# Patient Record
Sex: Female | Born: 1940 | Race: White | Hispanic: No | Smoking: Former smoker
Health system: Southern US, Community
[De-identification: ages and names within clinical notes are randomized; demographics above are authoritative.]

## PROBLEM LIST (undated history)

## (undated) DIAGNOSIS — K220 Achalasia of cardia: Secondary | ICD-10-CM

## (undated) DIAGNOSIS — G243 Spasmodic torticollis: Secondary | ICD-10-CM

## (undated) DIAGNOSIS — T7840XA Allergy, unspecified, initial encounter: Secondary | ICD-10-CM

## (undated) DIAGNOSIS — M1990 Unspecified osteoarthritis, unspecified site: Secondary | ICD-10-CM

## (undated) DIAGNOSIS — F419 Anxiety disorder, unspecified: Secondary | ICD-10-CM

## (undated) DIAGNOSIS — H9190 Unspecified hearing loss, unspecified ear: Secondary | ICD-10-CM

## (undated) DIAGNOSIS — M4800 Spinal stenosis, site unspecified: Secondary | ICD-10-CM

## (undated) DIAGNOSIS — H8109 Meniere's disease, unspecified ear: Secondary | ICD-10-CM

## (undated) DIAGNOSIS — F909 Attention-deficit hyperactivity disorder, unspecified type: Secondary | ICD-10-CM

## (undated) DIAGNOSIS — B019 Varicella without complication: Secondary | ICD-10-CM

## (undated) DIAGNOSIS — G709 Myoneural disorder, unspecified: Secondary | ICD-10-CM

## (undated) DIAGNOSIS — K222 Esophageal obstruction: Secondary | ICD-10-CM

## (undated) DIAGNOSIS — I10 Essential (primary) hypertension: Secondary | ICD-10-CM

## (undated) HISTORY — DX: Unspecified hearing loss, unspecified ear: H91.90

## (undated) HISTORY — DX: Meniere's disease, unspecified ear: H81.09

## (undated) HISTORY — DX: Spinal stenosis, site unspecified: M48.00

## (undated) HISTORY — DX: Spasmodic torticollis: G24.3

## (undated) HISTORY — DX: Attention-deficit hyperactivity disorder, unspecified type: F90.9

## (undated) HISTORY — PX: EYE SURGERY: SHX253

## (undated) HISTORY — DX: Allergy, unspecified, initial encounter: T78.40XA

## (undated) HISTORY — PX: CYST EXCISION: SHX5701

## (undated) HISTORY — DX: Varicella without complication: B01.9

## (undated) HISTORY — DX: Esophageal obstruction: K22.2

## (undated) HISTORY — DX: Unspecified osteoarthritis, unspecified site: M19.90

## (undated) HISTORY — DX: Achalasia of cardia: K22.0

---

## 2005-02-04 HISTORY — PX: NISSEN FUNDOPLICATION: SHX2091

## 2005-08-04 HISTORY — PX: HELLER MYOTOMY: SHX5259

## 2012-06-04 HISTORY — PX: TOTAL HIP ARTHROPLASTY: SHX124

## 2020-04-21 ENCOUNTER — Ambulatory Visit (INDEPENDENT_AMBULATORY_CARE_PROVIDER_SITE_OTHER): Payer: Medicare Other | Admitting: Family Medicine

## 2020-04-21 ENCOUNTER — Ambulatory Visit (INDEPENDENT_AMBULATORY_CARE_PROVIDER_SITE_OTHER): Payer: Medicare Other

## 2020-04-21 ENCOUNTER — Encounter: Payer: Self-pay | Admitting: Family Medicine

## 2020-04-21 ENCOUNTER — Other Ambulatory Visit: Payer: Self-pay

## 2020-04-21 VITALS — BP 120/76 | HR 94 | Ht 61.0 in | Wt 117.0 lb

## 2020-04-21 DIAGNOSIS — M25551 Pain in right hip: Secondary | ICD-10-CM

## 2020-04-21 DIAGNOSIS — M25561 Pain in right knee: Secondary | ICD-10-CM

## 2020-04-21 DIAGNOSIS — G8929 Other chronic pain: Secondary | ICD-10-CM | POA: Diagnosis not present

## 2020-04-21 DIAGNOSIS — G243 Spasmodic torticollis: Secondary | ICD-10-CM | POA: Insufficient documentation

## 2020-04-21 DIAGNOSIS — M1711 Unilateral primary osteoarthritis, right knee: Secondary | ICD-10-CM

## 2020-04-21 NOTE — Progress Notes (Addendum)
[name] [address] [place] [address] Phone: [phone] Subjective:   Melissa Cervantes, am serving as a scribe for Dr. Antoine Primas. This visit occurred during the SARS-CoV-2 public health emergency.  Safety protocols were in place, including screening questions prior to the visit, additional usage of staff PPE, and extensive cleaning of exam room while observing appropriate contact time as indicated for disinfecting solutions.   I'm seeing this patient by the request  of:  Patient, No Pcp Per  CC: Right knee and hip pain  GLO:[id number]  Melissa Cervantes is a 80 y.o. female coming in with complaint of R hip and R knee. Pain in groin for past year after hiking and from sit to stand. Patient feels weakness. Patient would like to walk 3 miles a day on her good days. Taking IBU prn, topical analegisc and did epsom salt soak.   Chronic pain in R knee with increasing pain over past 10 days. Pain over lateral aspect that is tingling. Pain over patellar tendon with palpitation.   History of torn meniscus in R knee 5 years. No surgical intervention but did do physical therapy.  Patient states that this feels a little bit similar but more instability noted.  History of L THR in 2014.  Right hip could be secondary to note to have she is walking she feels.          Social History   Socioeconomic History  . Marital status: Single    Spouse name: Not on file  . Number of children: Not on file  . Years of education: Not on file  . Highest education level: Not on file  Occupational History  . Not on file  Tobacco Use  . Smoking status: Not on file  . Smokeless tobacco: Not on file  Substance and Sexual Activity  . Alcohol use: Not on file  . Drug use: Not on file  . Sexual activity: Not on file  Other Topics Concern  . Not on file  Social History Narrative  . Not on file   Social Determinants of Health   Financial Resource Strain: Not on  file  Food Insecurity: Not on file  Transportation Needs: Not on file  Physical Activity: Not on file  Stress: Not on file  Social Connections: Not on file   Not on File No family history on file.    Current Outpatient Medications (Respiratory):  .  fluticasone (FLONASE) 50 MCG/ACT nasal spray, Place 2 sprays into both nostrils 2 (two) times daily. .  montelukast (SINGULAIR) 10 MG tablet, montelukast 10 mg tablet  Take 1 tablet as needed by oral route.  Current Outpatient Medications (Analgesics):  .  ibuprofen (ADVIL) 400 MG tablet, ibuprofen 400 mg tablet  Take 1 tablet 3 times a day by oral route as needed for 30 days.   Current Outpatient Medications (Other):  .  clonazePAM (KLONOPIN) 0.5 MG tablet, clonazepam 0.5 mg tablet  Take 1 tablet twice a day by oral route as needed for 30 days.   Reviewed prior external information including notes and imaging from  primary care provider As well as notes that were available from care everywhere and other healthcare systems.  Past medical history, social, surgical and family history all reviewed in electronic medical record.  No pertanent information unless stated regarding to the chief complaint.   Review of Systems:  No headache, visual changes, nausea, vomiting, diarrhea, constipation, dizziness, abdominal pain, skin rash, fevers, chills,  night sweats, weight loss, swollen lymph nodes, body aches, joint swelling, chest pain, shortness of breath, mood changes. POSITIVE muscle aches  Objective  Blood pressure 120/76, pulse 94, height 5\' 1"  (1.549 m), weight 117 lb (53.1 kg), SpO2 95 %.   General: No apparent distress alert the patient does seem to have some mild confusion.  Questionable mild dementia HEENT: Pupils equal, extraocular movements intact  Respiratory: Patient's speak in full sentences and does not appear short of breath  Cardiovascular: No lower extremity edema, non tender, no erythema  Gait normal with good balance and  coordination.  MSK: Right knee does have some instability with valgus and varus force.  Patient does have some tenderness mostly over the medial joint line and minorly over the popliteal area.  No swelling or masses appreciated.  Crepitus noted with range of motion.  ACL though does appear to be intact.  Mild lateral tracking of the patella noted.    Impression and Recommendations:     The above documentation has been reviewed and is accurate and complete , DO

## 2020-04-21 NOTE — Patient Instructions (Signed)
Pennsaid 2x a day and then Voltren gel  Vit D 2000IU daily Tart Cherry Extract 1200mg  at night Xray today See me again in 4 weeks

## 2020-04-21 NOTE — Assessment & Plan Note (Addendum)
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  Do believe that this is what is contributing to more of the pain.  Patient does have trace effusion noted.  Mild instability though with valgus and varus force.  Discussed different treatment options and patient declined injection.  We also discussed the possibility of a Doppler patient having some mild popliteal area.  Patient does not have swelling of the lower extremity and no redness.  Patient declined that today.  Patient would like to try conservative approach and go with more of the home exercises, hinged brace, topical anti-inflammatories and see how she responds.  Patient will follow up with me again in 4 to 6 weeks and with worsening pain consider injection as well as the possibility of physical therapy.  Patient knows if worsening pain over the weekend to seek medical attention immediately.

## 2020-05-02 ENCOUNTER — Ambulatory Visit: Payer: Self-pay | Admitting: Family Medicine

## 2020-05-23 NOTE — Progress Notes (Signed)
[facility] [address] [place] [address] Phone: [phone] Subjective:   Melissa Cervantes, am serving as a scribe for Dr. Antoine Primas. This visit occurred during the SARS-CoV-2 public health emergency.  Safety protocols were in place, including screening questions prior to the visit, additional usage of staff PPE, and extensive cleaning of exam room while observing appropriate contact time as indicated for disinfecting solutions.   I'm seeing this patient by the request  of:  Patient, No Pcp Per (Inactive)  CC: Right knee pain  PIR:[id number]   04/21/2020 Patient does have degenerative changes of the right knee.  Do believe that this is what is contributing to more of the pain.  Patient does have trace effusion noted.  Mild instability though with valgus and varus force.  Discussed different treatment options and patient declined injection.  We also discussed the possibility of a Doppler patient having some mild popliteal area.  Patient does not have swelling of the lower extremity and no redness.  Patient declined that today.  Patient would like to try conservative approach and go with more of the home exercises, hinged brace, topical anti-inflammatories and see how she responds.  Patient will follow up with me again in 4 to 6 weeks and with worsening pain consider injection as well as the possibility of physical therapy.  Patient knows if worsening pain over the weekend to seek medical attention immediately.  Update 05/24/2020 Melissa Cervantes is a 80 y.o. female coming in with complaint of right knee pain. Patient states that she is doing better. Knee pain dissipated a few days after last visit. Using Voltaren gel.   Patient notes R hip pain that occurs with exercise. Sometimes feels as if hip is going to "give out".  Pain over lateral aspect to the ASIS. Patient notes going up stairs elicits symptoms.    Xray 04/21/2020 R  Knee IMPRESSION: Osteoarthritic change, most notably in the patellofemoral joint. 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 No fracture, dislocation, or joint Effusion.  Xray 04/21/2020 Hip IMPRESSION: Status post total hip replacement on the left. No fracture or dislocation. Narrowing right hip joint. Probable calcific tendinosis lateral to the superolateral aspect of the right acetabulum. Degenerative change in lower lumbar spine and sacroiliac joints. No past medical history on file. No past surgical history on file. Social History   Socioeconomic History  . Marital status: Single    Spouse name: Not on file  . Number of children: Not on file  . Years of education: Not on file  . Highest education level: Not on file  Occupational History  . Not on file  Tobacco Use  . Smoking status: Not on file  . Smokeless tobacco: Not on file  Substance and Sexual Activity  . Alcohol use: Not on file  . Drug use: Not on file  . Sexual activity: Not on file  Other Topics Concern  . Not on file  Social History Narrative  . Not on file   Social Determinants of Health   Financial Resource Strain: Not on file  Food Insecurity: Not on file  Transportation Needs: Not on file  Physical Activity: Not on file  Stress: Not on file  Social Connections: Not on file   Not on File no family history of autoimmune No family history on file.    Current Outpatient Medications (Respiratory):  .  fluticasone (FLONASE) 50 MCG/ACT nasal spray, Place 2 sprays into both nostrils  2 (two) times daily. .  montelukast (SINGULAIR) 10 MG tablet, montelukast 10 mg tablet  Take 1 tablet as needed by oral route.  Current Outpatient Medications (Analgesics):  .  ibuprofen (ADVIL) 400 MG tablet, ibuprofen 400 mg tablet  Take 1 tablet 3 times a day by oral route as needed for 30 days.   Current Outpatient Medications (Other):  .  clonazePAM (KLONOPIN)  0.5 MG tablet, clonazepam 0.5 mg tablet  Take 1 tablet twice a day by oral route as needed for 30 days.   Reviewed prior external information including notes and imaging from  primary care provider As well as notes that were available from care everywhere and other healthcare systems.  Past medical history, social, surgical and family history all reviewed in electronic medical record.  No pertanent information unless stated regarding to the chief complaint.   Review of Systems:  No headache, visual changes, nausea, vomiting, diarrhea, constipation, dizziness, abdominal pain, skin rash, fevers, chills, night sweats, weight loss, swollen lymph nodes, body aches, joint swelling, chest pain, shortness of breath, mood changes. POSITIVE muscle aches  Objective  Blood pressure (!) 130/94, height 5\' 1"  (1.549 m), weight 122 lb (55.3 kg).   General: No apparent distress alert and oriented x3 mood and affect normal, dressed appropriately.  Patient is somewhat hard of hearing. HEENT: Pupils equal, extraocular movements intact  Respiratory: Patient's speak in full sentences and does not appear short of breath  Cardiovascular: No lower extremity edema, non tender, no erythema  Gait normal with good balance and coordination.  MSK: Patient's right knee does have some instability noted with valgus and varus force.  Trace effusion noted.  Mild limited lacking of the last 5 degrees of extension.  Very minimal tenderness to palpation. Right hip exam does have some mild limited internal range of motion noted.   Total time with patient discussing diet changes, physical exam and going over x-rays 35 minutes today. Impression and Recommendations:     The above documentation has been reviewed and is accurate and complete , DO

## 2020-05-24 ENCOUNTER — Ambulatory Visit (INDEPENDENT_AMBULATORY_CARE_PROVIDER_SITE_OTHER): Payer: Medicare Other | Admitting: Family Medicine

## 2020-05-24 ENCOUNTER — Other Ambulatory Visit: Payer: Self-pay

## 2020-05-24 ENCOUNTER — Encounter: Payer: Self-pay | Admitting: Family Medicine

## 2020-05-24 DIAGNOSIS — M1711 Unilateral primary osteoarthritis, right knee: Secondary | ICD-10-CM | POA: Diagnosis not present

## 2020-05-24 DIAGNOSIS — M1611 Unilateral primary osteoarthritis, right hip: Secondary | ICD-10-CM

## 2020-05-24 DIAGNOSIS — M1120 Other chondrocalcinosis, unspecified site: Secondary | ICD-10-CM | POA: Diagnosis not present

## 2020-05-24 NOTE — Assessment & Plan Note (Signed)
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  Discussed with patient about the instability.  Patient declined any type of injection again at this point.  Patient is doing relatively well and continuing to stay active.  Patient will start with aquatic therapy that I think will be helpful.  Patient will follow up with me again 2 to 3 months.

## 2020-05-24 NOTE — Patient Instructions (Signed)
Tart cherry extract 1200mg  at night Look on , Natures Made, NOW Hip is moderate arthritis-staying active great Start aquatic classes See me again in 2-3 months

## 2020-05-24 NOTE — Assessment & Plan Note (Signed)
Arthritis of the right hip.  Discussed icing regimen and home exercises.  Discussed which activities to do which wants to avoid.  Patient has moderate arthritis and has had a contralateral replacement previously.  We will continue to monitor and patient will follow up again in 2 to 3 months.

## 2020-05-24 NOTE — Assessment & Plan Note (Signed)
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  We discussed tart cherry extract, who may need possible allopurinol, patient encouraged him to continue a dairy diet.  Patient will follow up again in 2 to 3 months

## 2020-06-07 ENCOUNTER — Other Ambulatory Visit: Payer: Self-pay

## 2020-06-08 ENCOUNTER — Ambulatory Visit (INDEPENDENT_AMBULATORY_CARE_PROVIDER_SITE_OTHER): Payer: Medicare Other | Admitting: Adult Health

## 2020-06-08 ENCOUNTER — Encounter: Payer: Self-pay | Admitting: Adult Health

## 2020-06-08 VITALS — BP 118/62 | HR 73 | Temp 98.4°F | Ht 60.75 in | Wt 116.2 lb

## 2020-06-08 DIAGNOSIS — F909 Attention-deficit hyperactivity disorder, unspecified type: Secondary | ICD-10-CM

## 2020-06-08 DIAGNOSIS — Z1231 Encounter for screening mammogram for malignant neoplasm of breast: Secondary | ICD-10-CM

## 2020-06-08 DIAGNOSIS — J4520 Mild intermittent asthma, uncomplicated: Secondary | ICD-10-CM

## 2020-06-08 DIAGNOSIS — M159 Polyosteoarthritis, unspecified: Secondary | ICD-10-CM

## 2020-06-08 DIAGNOSIS — H8102 Meniere's disease, left ear: Secondary | ICD-10-CM | POA: Diagnosis not present

## 2020-06-08 DIAGNOSIS — Z7689 Persons encountering health services in other specified circumstances: Secondary | ICD-10-CM | POA: Diagnosis not present

## 2020-06-08 DIAGNOSIS — G243 Spasmodic torticollis: Secondary | ICD-10-CM

## 2020-06-08 DIAGNOSIS — M8949 Other hypertrophic osteoarthropathy, multiple sites: Secondary | ICD-10-CM

## 2020-06-08 DIAGNOSIS — M4800 Spinal stenosis, site unspecified: Secondary | ICD-10-CM

## 2020-06-08 DIAGNOSIS — K220 Achalasia of cardia: Secondary | ICD-10-CM

## 2020-06-08 DIAGNOSIS — E2839 Other primary ovarian failure: Secondary | ICD-10-CM

## 2020-06-08 MED ORDER — ALBUTEROL SULFATE HFA 108 (90 BASE) MCG/ACT IN AERS: 1.0000 | INHALATION_SPRAY | Freq: Four times a day (QID) | RESPIRATORY_TRACT | 3 refills | Status: DC | PRN

## 2020-06-08 NOTE — Patient Instructions (Signed)
It was great meeting you today   Please call and schedule your mammogram   Stop at the front desk and schedule an appointment for a physical   Someone will call you to schedule your physical therapy   If you need anything please let me know

## 2020-06-08 NOTE — Progress Notes (Signed)
Patient presents to clinic today to establish care. She is a pleasant 80 year old female who  has a past medical history of Achalasia, ADHD, Allergy, Cervical dystonia, Esophageal obstruction, Hearing loss, Meniere disease, and Osteoarthritis.  She recently moved to [place] to be closer to family.  Acute Concerns: Establish Care   Chronic Issues: Cervical Dystonia -onset about 22 years ago.  She reports neck spasms with head hanging down into the right.  Currently being seen at [facility] and receives Botox injections every 3 to 5 months, does exercises for balance and range of motion and takes Klonopin 0.5 mg nightly as an antispasmodic.  Does report that these modalities work well for her  Meniere's Disease-onset in 2006.  She reports profound hearing loss in her left ear, intermittent spells of recurrent orientation which have improved over the years but she also has a roaring and ringing in her left ear that occurs on a daily basis.  She does have hearing aids in both ears and uses a noise machine.  Achalasia -diagnosed in 2006.  She reports having several esophageal dilations as well as laparoscopic Heller's myotomy with full fundoplication, which resulted in permanent closure due to too tight of a suture.  She reports that she is unable to swallow solid foods and pills.  Adult ADHD -takes Adderall 15 mg twice daily.  She does feel controlled on this medication  Seasonal Allergies/Asthma -takes over-the-counter medication as well as Singulair as needed.  She does have a history of asthma mediated by seasonal allergies and uses an albuterol inhaler very infrequently for this.  She does need a refill of her albuterol inhaler  Hx of Spinal Stenosis -reports that at times her fingertips become numb and tingly, especially in the morning, this also happens to her right leg when she sits for too long.  She does walk a lot and denies any pain with activity.  Health Maintenance: Immunizations  -- UTD Colonoscopy -- Has had in the past - no longer needed d/t age  Mammogram -- Overdue  Bone Density -- Does not want one  Diet: tries to eat a heart healthy diet  Exercise: She does enjoy walking and hiking and tries to do this is much as possible.   Past Medical History:  Diagnosis Date  . Achalasia   . ADHD   . Allergy   . Cervical dystonia   . Esophageal obstruction    per patient she is unable to eat solid food  . Hearing loss   . Meniere disease   . Osteoarthritis     Past Surgical History:  Procedure Laterality Date  . CESAREAN SECTION  1971, 1979  . CYST EXCISION     tailbone  . 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 TOTAL HIP ARTHROPLASTY Left 06/2012    Current Outpatient Medications on File Prior to Visit  Medication Sig Dispense Refill  . albuterol (VENTOLIN HFA) 108 (90 Base) MCG/ACT inhaler Inhale into the lungs every 6 (six) hours as needed for wheezing or shortness of breath.    . amphetamine-dextroamphetamine (ADDERALL) 15 MG tablet Take 15 mg by mouth 2 (two) times daily.    . cetirizine (ZYRTEC) 10 MG chewable tablet Chew 10 mg by mouth as needed for allergies.    . clonazePAM (KLONOPIN) 0.5 MG tablet at bedtime.    . fluticasone (FLONASE) 50 MCG/ACT nasal spray Place 2 sprays into both nostrils 2 (two) times daily.    . montelukast (SINGULAIR) 10 MG tablet  Take 10 mg by mouth as needed.     No current facility-administered medications on file prior to visit.    Allergies  Allergen Reactions  . Other     Seasonal allergies, horses, cats and mold    Family History  Problem Relation Age of Onset  . Heart disease Mother   . Heart disease Father   . Alcohol abuse Father   . Dystonia Sister   . Dystonia Brother     Social History   Socioeconomic History  . Marital status: Divorced    Spouse name: Not on file  . Number of children: Not on file  . Years of education: Not on file  . Highest education level: Not on file  Occupational History  . Not  on file  Tobacco Use  . Smoking status: Former Games developer  . Smokeless tobacco: Never Used  Vaping Use  . Vaping Use: Never used  Substance and Sexual Activity  . Alcohol use: Yes  . Drug use: Not Currently    Types: Marijuana  . Sexual activity: Not on file  Other Topics Concern  . Not on file  Social History Narrative  . Not on file   Social Determinants of Health   Financial Resource Strain: Not on file  Food Insecurity: Not on file  Transportation Needs: Not on file  Physical Activity: Not on file  Stress: Not on file  Social Connections: Not on file  Intimate Partner Violence: Not on file    Review of Systems  Constitutional: Negative.   HENT: Positive for hearing loss and tinnitus.   Eyes: Negative.   Respiratory: Negative.   Cardiovascular: Negative.   Gastrointestinal: Negative.   Genitourinary: Negative.   Musculoskeletal: Positive for back pain, joint pain and neck pain.  Skin: Negative.   Neurological: Positive for tingling.  Endo/Heme/Allergies: Negative.   Psychiatric/Behavioral: Negative.     BP 118/62 (BP Location: Left Arm, Patient Position: Sitting, Cuff Size: Normal)   Pulse 73   Temp 98.4 F (36.9 C) (Oral)   Ht 5' 0.75" (1.543 m)   Wt 116 lb 3.2 oz (52.7 kg)   SpO2 98%   BMI 22.14 kg/m   Physical Exam Vitals and nursing note reviewed.  Constitutional:      Appearance: Normal appearance.  Cardiovascular:     Rate and Rhythm: Normal rate and regular rhythm.     Pulses: Normal pulses.          Radial pulses are 2+ on the right side and 2+ on the left side.       Popliteal pulses are 2+ on the right side and 2+ on the left side.       Dorsalis pedis pulses are 2+ on the right side and 2+ on the left side.       Posterior tibial pulses are 2+ on the right side and 2+ on the left side.     Heart sounds: Normal heart sounds.  Pulmonary:     Effort: Pulmonary effort is normal.     Breath sounds: Normal breath sounds.  Musculoskeletal:         General: Normal range of motion.     Right lower leg: No edema.     Left lower leg: No edema.  Skin:    General: Skin is warm.     Capillary Refill: Capillary refill takes less than 2 seconds.  Neurological:     General: No focal deficit present.     Mental Status: She  is alert and oriented to person, place, and time.     Sensory: No sensory deficit.     Motor: No weakness.     Coordination: Coordination normal.     Gait: Gait normal.     Deep Tendon Reflexes: Reflexes normal.  Psychiatric:        Mood and Affect: Mood normal.        Behavior: Behavior normal.        Thought Content: Thought content normal.        Judgment: Judgment normal.     Assessment/Plan: 1. Encounter to establish care - She will follow up for her CPE and can follow up sooner if needed - Continue to stay active and eat healthy   2. Screening mammogram for breast cancer  - MM DIGITAL SCREENING BILATERAL; Future  3. Meniere's disease of left ear - Continue conservative management   4. Attention deficit hyperactivity disorder (ADHD), unspecified ADHD type - Does not need refill of adderall yet, she will inform me when she does   5. Achalasia - Does not want to see GI at this time  - Will continue with pureed foods.   6. Primary osteoarthritis involving multiple joints - PT   7. Spinal stenosis, unspecified spinal region - with radiculopthy. She would like to try physical therapy  - Ambulatory referral to Physical Therapy  8. Mild intermittent asthma without complication  - albuterol (VENTOLIN HFA) 108 (90 Base) MCG/ACT inhaler; Inhale 1-2 puffs into the lungs every 6 (six) hours as needed for wheezing or shortness of breath.  Dispense: 8 g; Refill: 3  9. Cervical dystonia - Continue with Botox injections and clonopin.   Shirline Frees, NP

## 2020-06-09 ENCOUNTER — Encounter: Payer: Self-pay | Admitting: Adult Health

## 2020-06-09 DIAGNOSIS — M1990 Unspecified osteoarthritis, unspecified site: Secondary | ICD-10-CM | POA: Insufficient documentation

## 2020-06-09 DIAGNOSIS — M4800 Spinal stenosis, site unspecified: Secondary | ICD-10-CM | POA: Insufficient documentation

## 2020-06-09 DIAGNOSIS — K220 Achalasia of cardia: Secondary | ICD-10-CM | POA: Insufficient documentation

## 2020-06-09 DIAGNOSIS — F909 Attention-deficit hyperactivity disorder, unspecified type: Secondary | ICD-10-CM | POA: Insufficient documentation

## 2020-06-09 DIAGNOSIS — H8109 Meniere's disease, unspecified ear: Secondary | ICD-10-CM | POA: Insufficient documentation

## 2020-06-12 ENCOUNTER — Ambulatory Visit: Payer: Medicare Other | Admitting: Family

## 2020-06-20 ENCOUNTER — Ambulatory Visit (HOSPITAL_BASED_OUTPATIENT_CLINIC_OR_DEPARTMENT_OTHER): Admission: RE | Admit: 2020-06-20 | Payer: Medicare Other | Source: Ambulatory Visit | Admitting: Radiology

## 2020-06-21 ENCOUNTER — Encounter: Payer: Self-pay | Admitting: Physical Therapy

## 2020-06-21 ENCOUNTER — Ambulatory Visit: Payer: Medicare Other | Attending: Adult Health | Admitting: Physical Therapy

## 2020-06-21 ENCOUNTER — Other Ambulatory Visit: Payer: Self-pay

## 2020-06-21 DIAGNOSIS — R293 Abnormal posture: Secondary | ICD-10-CM | POA: Insufficient documentation

## 2020-06-21 DIAGNOSIS — R252 Cramp and spasm: Secondary | ICD-10-CM | POA: Diagnosis present

## 2020-06-21 DIAGNOSIS — M545 Low back pain, unspecified: Secondary | ICD-10-CM | POA: Diagnosis present

## 2020-06-21 DIAGNOSIS — M6281 Muscle weakness (generalized): Secondary | ICD-10-CM | POA: Insufficient documentation

## 2020-06-21 NOTE — Therapy (Addendum)
[facility] [address], [place], [place], [address] Phone: [phone]   Fax:  [phone]  Physical Therapy Evaluation  Patient Details  Name: Melissa Cervantes MRN: [id number] Date of Birth: 10/16/1940 Referring Provider (PT): Dorothyann Peng, NP   Encounter Date: 06/21/2020   PT End of Session - 06/21/20 1714     Visit Number 1    Date for PT Re-Evaluation 08/18/20    Authorization Type Medicare    Progress Note Due on Visit 10    PT Start Time 1407   Patient arriving late   PT Stop Time 1445    PT Time Calculation (min) 38 min    Activity Tolerance Patient tolerated treatment well    Behavior During Therapy [facility] for tasks assessed/performed             Past Medical History:  Diagnosis Date   Achalasia    ADHD    Allergy    Cervical dystonia    Chicken pox    Esophageal obstruction    per patient she is unable to eat solid food   Hearing loss    Meniere disease    Osteoarthritis    Spinal stenosis     Past Surgical History:  Procedure Laterality Date   CESAREAN SECTION  1971, 1979   CYST EXCISION     tailbone   HELLER MYOTOMY  08/2005   TOTAL HIP ARTHROPLASTY Left 06/2012    There were no vitals filed for this visit.    Subjective Assessment - 06/21/20 1409     Subjective Patient presenting due to lumbar stenosis. States that she has constant bilateral toe and finger numbness. She reports pins and needles that radiate distally to calf of Rt LE and occur with prolonged sitting.    Pertinent History arthritis, spinal stenosis, meniere disease    Limitations Sitting    Patient Stated Goals to know why her fingers and toes are numb    Currently in Pain? --   unable to quantify; patient required frequent redirection to questions asked and task at hand               [facility] PT Assessment - 06/21/20 0001       Assessment   Medical Diagnosis M48.00 (ICD-10-CM) - Spinal stenosis,  unspecified spinal region    Referring Provider (PT) Dorothyann Peng, NP    Onset Date/Surgical Date --   2-3 months   Hand Dominance Right    Next MD Visit July    Prior Therapy Yes      Precautions   Precautions None      Restrictions   Weight Bearing Restrictions No      Balance Screen   Has the patient fallen in the past 6 months No    Has the patient had a decrease in activity level because of a fear of falling?  No    Is the patient reluctant to leave their home because of a fear of falling?  No      Home Ecologist residence    Living Arrangements Alone      Prior Function   Level of [place] Retired    Leisure garden, horse riding, hike, Astronomer   Overall Cognitive Status Within Functional Limits for tasks assessed      Observation/Other Assessments   Focus on Therapeutic Outcomes (FOTO)  61 (goal 67)  Posture/Postural Control   Posture/Postural Control Postural limitations    Postural Limitations Flexed trunk;Posterior pelvic tilt;Decreased lumbar lordosis      ROM / Strength   AROM / PROM / Strength AROM;Strength      AROM   AROM Assessment Site Hip    Right Hip External Rotation  27    Right Hip Internal Rotation  22    Left Hip External Rotation  20    Left Hip Internal Rotation  31      Strength   Strength Assessment Site Hip;Knee;Ankle    Right Hip Flexion 4+/5    Right Hip Extension 3/5    Right Hip ABduction 4+/5    Right Hip ADduction 5/5    Left Hip Flexion 4/5    Left Hip Extension 5/5    Left Hip ABduction 4+/5    Left Hip ADduction 5/5    Right/Left Knee Right;Left    Right Knee Flexion 5/5    Right Knee Extension 5/5    Left Knee Flexion 5/5    Left Knee Extension 5/5    Right Ankle Dorsiflexion 5/5    Right Ankle Plantar Flexion 5/5    Left Ankle Dorsiflexion 5/5    Left Ankle Plantar Flexion 5/5      Flexibility   Soft Tissue Assessment /Muscle Length  yes    Hamstrings impaired; approx 40 degrees from full knee extension      Special Tests    Special Tests Lumbar;Hip Special Tests      Slump test   Findings Positive    Side Right      Straight Leg Raise   Findings Negative    Side  Right      Saralyn Pilar (FABER) Test   Findings Negative    Side Right      Hip Scouring   Findings Positive    Side Right      other   Findings Positive    Side Right    Comments FADIR                        Objective measurements completed on examination: See above findings.                 PT Short Term Goals - 06/21/20 1708       PT SHORT TERM GOAL #1   Title Patient will be independent with HEP for continued progression at home.    Time 4    Period Weeks    Status New    Target Date 07/19/20      PT SHORT TERM GOAL #2   Title Patient will report sitting tolerance increased to 30 minutes without increased radicular symptoms in Rt LE.    Time 4    Period Weeks    Status New    Target Date 07/19/20      PT SHORT TERM GOAL #3   Title Patient will demonstrate 3+/5 Rt hip extension to indicate improved pelvic/lumbar stability for decreased pain.    Baseline 3/5    Time 4    Period Weeks    Status New    Target Date 07/19/20               PT Long Term Goals - 06/21/20 1712       PT LONG TERM GOAL #1   Title Patient will be independent with advanced HEP for long term management of symptoms post D/C.  Time 8    Period Weeks    Status New    Target Date 08/16/20      PT LONG TERM GOAL #2   Title Patient will improve FOTO score to 67 to indicate improved overall function,    Baseline 61    Time 8    Period Weeks    Status New    Target Date 08/16/20      PT LONG TERM GOAL #3   Title Patient will demonstrate 4+/5 Rt hip extension to indicate improved pelvic/lumbar stability for decreased pain    Baseline 3/5    Time 8    Period Weeks    Status New    Target Date 08/16/20                     Plan - 06/21/20 1441     Clinical Impression Statement Patient is a 80 y/o female referred due to spinal stenosis. Per patient description of symptoms stenosis is likely in lumbar region. PMH includes arthritis, Meniere's disease, and spinal stenosis. Patient reported activity limitations include prolonged sitting and horseback riding as she is unable to raise Rt LE over back of horse. Patient demonstrates Rt glute max strength impairments as hip extension 3/5 with MMT. She demonstrates bilateral hip AROM impairments with regards to internal and external rotation. Slump test positive for Rt LE which indicates possible radicular involvement. Additionally hip scour and FADIR positive for Rt LE indicating possible hip joint pathology. Would benefit from skilled therapeutic intervention to address impairments for improved activity tolerance and to return to recreational activity for healthy lifestyle.    Personal Factors and Comorbidities Comorbidity 3+    Comorbidities arthritis, spinal stenosis, meniere disease    Examination-Activity Limitations Sit    Examination-Participation Restrictions Community Activity    Stability/Clinical Decision Making Stable/Uncomplicated    Clinical Decision Making Low    Rehab Potential Good    PT Frequency 2x / week    PT Duration 8 weeks    PT Treatment/Interventions ADLs/Self Care Home Management;Aquatic Therapy;Cryotherapy;Electrical Stimulation;Iontophoresis 55m/ml Dexamethasone;Moist Heat;Traction;Gait training;Functional mobility training;Therapeutic activities;Therapeutic exercise;Balance training;Neuromuscular re-education;Patient/family education;Manual techniques;Dry needling;Taping;Joint Manipulations;Spinal Manipulations    PT Next Visit Plan establish HEP; begin core and glute strengthening    Consulted and Agree with Plan of Care Patient             Patient will benefit from skilled therapeutic intervention in order to improve  the following deficits and impairments:  Abnormal gait,Decreased mobility,Decreased range of motion,Decreased strength,Hypomobility,Increased muscle spasms,Postural dysfunction,Pain  Visit Diagnosis: Abnormal posture - Plan: PT plan of care cert/re-cert  Cramp and spasm - Plan: PT plan of care cert/re-cert  Muscle weakness (generalized) - Plan: PT plan of care cert/re-cert  Acute right-sided low back pain, unspecified whether sciatica present - Plan: PT plan of care cert/re-cert     Problem List Patient Active Problem List   Diagnosis Date Noted   Spinal stenosis 06/09/2020   Meniere disease    ADHD    Achalasia    Osteoarthritis    Arthritis of right hip 05/24/2020   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 Patient goals  were not met. Patient is being discharged due to not returning since the last visit.    Everardo All PT, DPT  08/15/20 10:21 AM     [facility] [address], [place], [place], [address] Phone: [phone]   Fax:  [phone]  Name: Jullianna Gabor MRN: [id number] Date of Birth: [birth year]

## 2020-06-25 ENCOUNTER — Emergency Department (HOSPITAL_BASED_OUTPATIENT_CLINIC_OR_DEPARTMENT_OTHER): Payer: Medicare Other

## 2020-06-25 ENCOUNTER — Emergency Department (HOSPITAL_BASED_OUTPATIENT_CLINIC_OR_DEPARTMENT_OTHER)
Admission: EM | Admit: 2020-06-25 | Discharge: 2020-06-26 | Disposition: A | Discharge status: Home / Self Care | Payer: Medicare Other | Attending: Emergency Medicine | Admitting: Emergency Medicine

## 2020-06-25 ENCOUNTER — Encounter (HOSPITAL_BASED_OUTPATIENT_CLINIC_OR_DEPARTMENT_OTHER): Payer: Self-pay | Admitting: Obstetrics and Gynecology

## 2020-06-25 ENCOUNTER — Other Ambulatory Visit: Payer: Self-pay

## 2020-06-25 DIAGNOSIS — U071 COVID-19: Secondary | ICD-10-CM | POA: Diagnosis not present

## 2020-06-25 DIAGNOSIS — Z96642 Presence of left artificial hip joint: Secondary | ICD-10-CM | POA: Diagnosis not present

## 2020-06-25 DIAGNOSIS — Z87891 Personal history of nicotine dependence: Secondary | ICD-10-CM | POA: Insufficient documentation

## 2020-06-25 DIAGNOSIS — R059 Cough, unspecified: Secondary | ICD-10-CM | POA: Diagnosis present

## 2020-06-25 LAB — COMPREHENSIVE METABOLIC PANEL
ALT: 13 U/L (ref 0–44)
AST: 23 U/L (ref 15–41)
Albumin: 4.5 g/dL (ref 3.5–5.0)
Alkaline Phosphatase: 76 U/L (ref 38–126)
Anion gap: 8 (ref 5–15)
BUN: 11 mg/dL (ref 8–23)
CO2: 27 mmol/L (ref 22–32)
Calcium: 9.4 mg/dL (ref 8.9–10.3)
Chloride: 99 mmol/L (ref 98–111)
Creatinine, Ser: 0.79 mg/dL (ref 0.44–1.00)
GFR, Estimated: >60 mL/min (ref >60)
Glucose, Bld: 98 mg/dL (ref 70–99)
Potassium: 4 mmol/L (ref 3.5–5.1)
Sodium: 134 mmol/L — ABNORMAL LOW (ref 135–145)
Total Bilirubin: 0.3 mg/dL (ref 0.3–1.2)
Total Protein: 7.2 g/dL (ref 6.5–8.1)

## 2020-06-25 LAB — CBC WITH DIFFERENTIAL/PLATELET
Abs Immature Granulocytes: 0 10*3/uL (ref 0.00–0.07)
Basophils Absolute: 0.1 10*3/uL (ref 0.0–0.1)
Basophils Relative: 1 %
Eosinophils Absolute: 0.1 10*3/uL (ref 0.0–0.5)
Eosinophils Relative: 2 %
HCT: 42.5 % (ref 36.0–46.0)
Hemoglobin: 14.2 g/dL (ref 12.0–15.0)
Immature Granulocytes: 0 %
Lymphocytes Relative: 18 %
Lymphs Abs: 0.8 10*3/uL (ref 0.7–4.0)
MCH: 31.3 pg (ref 26.0–34.0)
MCHC: 33.4 g/dL (ref 30.0–36.0)
MCV: 93.8 fL (ref 80.0–100.0)
Monocytes Absolute: 1 10*3/uL (ref 0.1–1.0)
Monocytes Relative: 23 %
Neutro Abs: 2.4 10*3/uL (ref 1.7–7.7)
Neutrophils Relative %: 56 %
Platelets: 252 10*3/uL (ref 150–400)
RBC: 4.53 MIL/uL (ref 3.87–5.11)
RDW: 12.9 % (ref 11.5–15.5)
WBC: 4.2 10*3/uL (ref 4.0–10.5)
nRBC: 0 % (ref 0.0–0.2)

## 2020-06-25 MED ORDER — NIRMATRELVIR/RITONAVIR (PAXLOVID) TABLET (RENAL DOSING): 2.0000 | ORAL_TABLET | Freq: Two times a day (BID) | ORAL | 0 refills | Status: DC

## 2020-06-25 NOTE — ED Triage Notes (Signed)
Patient reports she has not been feeling well for a few weeks. Patient states she tested positive for COVID today and has had a fever

## 2020-06-26 ENCOUNTER — Encounter: Payer: Self-pay | Admitting: Adult Health

## 2020-06-26 ENCOUNTER — Ambulatory Visit (HOSPITAL_BASED_OUTPATIENT_CLINIC_OR_DEPARTMENT_OTHER): Payer: Medicare Other | Admitting: Radiology

## 2020-06-26 ENCOUNTER — Other Ambulatory Visit: Payer: Self-pay | Admitting: Adult Health

## 2020-06-26 ENCOUNTER — Telehealth: Payer: Self-pay | Admitting: Adult Health

## 2020-06-26 DIAGNOSIS — U071 COVID-19: Secondary | ICD-10-CM

## 2020-06-26 LAB — RESP PANEL BY RT-PCR (FLU A&B, COVID) ARPGX2
Influenza A by PCR: NEGATIVE
Influenza B by PCR: NEGATIVE
SARS Coronavirus 2 by RT PCR: POSITIVE — AB

## 2020-06-26 NOTE — Telephone Encounter (Signed)
Pts niece is calling in to check the status to the order for the pt to have a infusion for COVID.  She is aware that the order has been placed and that someone from the infusion department [facility]) will give her a call to get her scheduled.

## 2020-06-26 NOTE — Discharge Instructions (Addendum)
You were seen today for COVID-19 symptoms.  Your COVID testing was repeated.  Assuming this is confirmed, you will be given a prescription for Paxilovid.  I will call you in the morning with your official results.  Start Paxilovid daily for 5 days.  Make sure that you are staying hydrated.  Take Tylenol or ibuprofen for body aches or pains.  Return for worsening shortness of breath, chest pain.

## 2020-06-26 NOTE — Telephone Encounter (Signed)
The patient's daughter Glade Nurse called to follow up on the order for the COVID infusion for the patient.  Please advise   Glade Nurse [phone]

## 2020-06-26 NOTE — Telephone Encounter (Signed)
Pts niece is calling in stating that the pt has COVID and was at the [facility] ER and they were going to give her medication for it but the pt is not able to swollow pills and would like to see if she can get the IV infusion for COVID.

## 2020-06-26 NOTE — ED Provider Notes (Addendum)
[facility] EMERGENCY DEPT Provider Note   CSN: [id number] Arrival date & time: 06/25/20  2013     History Chief Complaint  Patient presents with  . Covid Positive    Melissa Cervantes is a 80 y.o. female.  HPI     This is a 80 year old female with a history of achalasia, Mnire's disease, spinal stenosis who presents with body aches and pains.  Patient reports she has had a cough for approximately 2 weeks.  She took a COVID test at the beginning of that and it was negative.  She states over the last 24 hours she has had significant generalized weakness, myalgias.  She states she had a fever up to 102.  She took a home COVID test and this was positive.  She has had some shortness of breath with cough but has been using an albuterol inhaler with some relief.  Denies chest pain.  Mostly she is complaining of feeling cold and generally weak.  She states "I just feel bad."  Past Medical History:  Diagnosis Date  . Achalasia   . ADHD   . Allergy   . Cervical dystonia   . Chicken pox   . Esophageal obstruction    per patient she is unable to eat solid food  . Hearing loss   . Meniere disease   . Osteoarthritis   . Spinal stenosis     Patient Active Problem List   Diagnosis Date Noted  . Spinal stenosis 06/09/2020  . Meniere disease   . ADHD   . Achalasia   . Osteoarthritis   . Arthritis of right hip 05/24/2020  . 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  . Degenerative arthritis of right knee 04/21/2020    Past Surgical History:  Procedure Laterality Date  . CESAREAN SECTION  1971, 1979  . CYST EXCISION     tailbone  . 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 TOTAL HIP ARTHROPLASTY Left 06/2012     OB History   No obstetric history on file.     Family History  Problem Relation Age of Onset  . Heart disease Mother   . Heart disease Father   . Alcohol abuse Father   . Dystonia Sister   . Dystonia Brother      Social History   Tobacco Use  . Smoking status: Former Games developer  . Smokeless tobacco: Never Used  Vaping Use  . Vaping Use: Never used  Substance Use Topics  . Alcohol use: Yes    Comment: 4  . Drug use: Not Currently    Types: Marijuana    Home Medications Prior to Admission medications   Medication Sig Start Date End Date Taking? Authorizing Provider  nirmatrelvir/ritonavir EUA, renal dosing, (PAXLOVID) TABS Take 2 tablets by mouth 2 (two) times daily for 5 days. Patient GFR is >60. Take nirmatrelvir (150 mg) one tablet twice daily for 5 days and ritonavir (100 mg) one tablet twice daily for 5 days. 06/25/20 06/30/20 Yes Tanikka Bresnan, Mayer Masker, MD  albuterol (VENTOLIN HFA) 108 (90 Base) MCG/ACT inhaler Inhale 1-2 puffs into the lungs every 6 (six) hours as needed for wheezing or shortness of breath. 06/08/20   Nafziger, Kandee Keen, NP  amphetamine-dextroamphetamine (ADDERALL) 15 MG tablet Take 15 mg by mouth 2 (two) times daily.    [provider]  cetirizine (ZYRTEC) 10 MG chewable tablet Chew 10 mg by mouth as needed for allergies.    [provider]  clonazePAM Scarlette Calico) 0.5  MG tablet at bedtime.    [provider]  fluticasone (FLONASE) 50 MCG/ACT nasal spray Place 2 sprays into both nostrils 2 (two) times daily. 04/08/20   [provider]  montelukast (SINGULAIR) 10 MG tablet Take 10 mg by mouth as needed.    [provider]    Allergies    Other  Review of Systems   Review of Systems  Constitutional: Positive for chills and fever.  Respiratory: Positive for cough and shortness of breath.   Cardiovascular: Negative for chest pain.  Gastrointestinal: Negative for abdominal pain, nausea and vomiting.  Musculoskeletal: Positive for myalgias.  Neurological: Negative for headaches.  All other systems reviewed and are negative.   Physical Exam Updated Vital Signs BP (!) 123/92 (BP Location: Left Arm)   Pulse 77   Temp 100.1 F (37.8 C)    Resp (!) 24   Ht 1.549 m (5\' 1" )   Wt 52.6 kg   SpO2 97%   BMI 21.92 kg/m   Physical Exam Vitals and nursing note reviewed.  Constitutional:      Appearance: She is well-developed. She is not toxic-appearing.  HENT:     Head: Normocephalic and atraumatic.     Nose: Nose normal.     Mouth/Throat:     Mouth: Mucous membranes are dry.  Eyes:     Pupils: Pupils are equal, round, and reactive to light.  Cardiovascular:     Rate and Rhythm: Normal rate and regular rhythm.     Heart sounds: Normal heart sounds.  Pulmonary:     Effort: Pulmonary effort is normal. No respiratory distress.     Breath sounds: No wheezing.     Comments: Distant breath sounds in all lung fields, no wheezing Abdominal:     General: Bowel sounds are normal.     Palpations: Abdomen is soft.     Tenderness: There is no abdominal tenderness. There is no guarding or rebound.  Musculoskeletal:     Cervical back: Neck supple.     Right lower leg: No edema.     Left lower leg: No edema.  Skin:    General: Skin is warm and dry.  Neurological:     Mental Status: She is alert and oriented to person, place, and time.  Psychiatric:        Mood and Affect: Mood normal.     ED Results / Procedures / Treatments   Labs (all labs ordered are listed, but only abnormal results are displayed) Labs Reviewed  COMPREHENSIVE METABOLIC PANEL - Abnormal; Notable for the following components:      Result Value   Sodium 134 (*)    All other components within normal limits  CBC WITH DIFFERENTIAL/PLATELET    EKG None  Radiology DG Chest Portable 1 View  Result Date: 06/25/2020 CLINICAL DATA:  COVID-19 positive. EXAM: PORTABLE CHEST 1 VIEW COMPARISON:  None. FINDINGS: The heart size and mediastinal contours are within normal limits. Both lungs are clear. The visualized skeletal structures are unremarkable. IMPRESSION: No active disease. Electronically Signed   By: 06/27/2020 M.D.   On: 06/25/2020 22:02     Procedures Procedures   Medications Ordered in ED Medications - No data to display  ED Course  I have reviewed the triage vital signs and the nursing notes.  Pertinent labs & imaging results that were available during my care of the patient were reviewed by me and considered in my medical decision making (see chart for details).  MDM Rules/Calculators/A&P                          Patient presents with myalgias, fever, generalized weakness.  She had a positive home COVID-19 test.  I reviewed her vital signs.  O2 sats greater than 97%.  She is in no respiratory distress.  Not currently wheezing.  Temperature here 100.1.  Suspect her symptoms are related to acute COVID-19 infection.  Given significance of symptoms over the last 24 hours and negative COVID testing at onset of cough, feel that she likely has just recently turned positive and is within the 72-hour window for his Paxlovid.  No significant metabolic derangements.  Chest x-ray shows no evidence of consolidative pneumonia.  Given that it was a home COVID-19 test, will repeat to confirm.  Patient does not wish to wait for results.  She was provided with a prescription for Paxlovid and I told her I would call her in the morning to confirm results.  She was advised of return precautions.  After history, exam, and medical workup I feel the patient has been appropriately medically screened and is safe for discharge home. Pertinent diagnoses were discussed with the patient. Patient was given return precautions.  2:38 AM COVID is confirmed positive.  Received a phone call from the patient's niece.  Patient's niece indicates that the patient cannot swallow pills.  This information was not provided by the patient.  Per pharmacy, Paxlovid cannot be crushed.  Patient was overall nontoxic.  She does meet high risk criteria given her age.  Recommend follow-up with her primary physician to be set up for outpatient infusion.  Niece stated  understanding.  Final Clinical Impression(s) / ED Diagnoses Final diagnoses:  COVID-19    Rx / DC Orders ED Discharge Orders         Ordered    nirmatrelvir/ritonavir EUA, renal dosing, (PAXLOVID) TABS  2 times daily        06/25/20 2359           Kyren Knick, Mayer Masker, MD 06/26/20 [id number]    Shon Baton, MD 06/26/20 [phone]

## 2020-06-26 NOTE — ED Notes (Signed)
Dr. Wilkie Aye aware of positive covid result. Dr. Wilkie Aye asked me to notify the pt of her results. Spoke with pt's niece and she is aware that pt is covid positive and aware that she has a RX that needs to be filled. Voiced understanding.

## 2020-06-26 NOTE — ED Notes (Addendum)
Pt called her ride to transport home. Pt states she will sit in waiting room until ride gets here.

## 2020-06-27 ENCOUNTER — Encounter: Payer: Self-pay | Admitting: Physical Therapy

## 2020-06-27 ENCOUNTER — Telehealth: Payer: Self-pay

## 2020-06-27 ENCOUNTER — Other Ambulatory Visit: Payer: Self-pay | Admitting: Physician Assistant

## 2020-06-27 ENCOUNTER — Other Ambulatory Visit: Payer: Self-pay | Admitting: Adult Health

## 2020-06-27 DIAGNOSIS — U071 COVID-19: Secondary | ICD-10-CM

## 2020-06-27 DIAGNOSIS — K220 Achalasia of cardia: Secondary | ICD-10-CM

## 2020-06-27 NOTE — Addendum Note (Signed)
Addended by: Janetta Hora on: 06/27/2020 05:15 PM   Modules accepted: Orders

## 2020-06-27 NOTE — Telephone Encounter (Signed)
Do you know if this was placed.

## 2020-06-27 NOTE — Telephone Encounter (Signed)
Pt has already been Rx'd paxlovid but doesn't know if she can crush paxlovid (has to do this due ot dysphagia from achalasia.). will discuss with a pharmacist and get back to her.   Cline Crock PA-C  [facility]

## 2020-06-27 NOTE — Telephone Encounter (Signed)
Called to discuss with patient about COVID-19 symptoms and the use of one of the available treatments for those with mild to moderate Covid symptoms and at a high risk of hospitalization.  Pt appears to qualify for outpatient treatment due to co-morbid conditions and/or a member of an at-risk group in accordance with the FDA Emergency Use Authorization.    Symptom onset: 2 weeks - cough 06/24/20 weakness, body aches, fever Vaccinated: Yes Booster? Yes Immunocompromised? No Qualifiers: Pulmonary disease NIH Criteria: Tier 3  Unable to reach pt - Left message and call back number [phone].   Melissa Cervantes

## 2020-06-27 NOTE — Progress Notes (Signed)
I connected by phone with Melissa Cervantes on 06/27/2020 at 5:13 PM to discuss the potential use of a new treatment for mild to moderate COVID-19 viral infection in non-hospitalized patients.  This patient is a 80 y.o. female that meets the FDA criteria for Emergency Use Authorization of COVID monoclonal antibody bebtelovimab.  Has a (+) direct SARS-CoV-2 viral test result  Has mild or moderate COVID-19   Is NOT hospitalized due to COVID-19  Is within 10 days of symptom onset  Has at least one of the high risk factor(s) for progression to severe COVID-19 and/or hospitalization as defined in EUA.  Specific high risk criteria : Older age (>/= 79 yo) and cannot take oral meds due to achalasia   I have spoken and communicated the following to the patient or parent/caregiver regarding COVID monoclonal antibody treatment:  1. FDA has authorized the emergency use for the treatment of mild to moderate COVID-19 in adults and pediatric patients with positive results of direct SARS-CoV-2 viral testing who are 79 years of age and older weighing at least 40 kg, and who are at high risk for progressing to severe COVID-19 and/or hospitalization.  2. The significant known and potential risks and benefits of COVID monoclonal antibody, and the extent to which such potential risks and benefits are unknown.  3. Information on available alternative treatments and the risks and benefits of those alternatives, including clinical trials.  4. Patients treated with COVID monoclonal antibody should continue to self-isolate and use infection control measures (e.g., wear mask, isolate, social distance, avoid sharing personal items, clean and disinfect "high touch" surfaces, and frequent handwashing) according to CDC guidelines.   5. The patient or parent/caregiver has the option to accept or refuse COVID monoclonal antibody treatment.  6. Discussion about the monoclonal antibody infusion does not ensure treatment. The  patient will be placed on a list and scheduled according to risk, symptom onset and availability. A scheduler will reach to the patient to let them know if we can accommodate their infusion or not.  After reviewing this information with the patient, the patient has agreed to receive one of the available covid 19 monoclonal antibodies and will be provided an appropriate fact sheet prior to infusion.    Cline Crock, PA-C 06/27/2020 5:13 PM

## 2020-06-28 NOTE — Telephone Encounter (Signed)
Noted  

## 2020-06-29 NOTE — Telephone Encounter (Signed)
Spoke to [place] and advised that I was calling to see if pt had been scheduled for the infusion clinic. Glade Nurse stated that the pt has been scheduled for tomorrow but was concerned hat it may be too late for pt to wait. I advised Jamon to ask the staff at the infusion clinic so they can better help her. Jamon verbalized understanding. No further action needed!

## 2020-06-29 NOTE — Telephone Encounter (Signed)
Called both pt and daughter phone to see if pt has had the infusion but no answer.

## 2020-06-29 NOTE — Telephone Encounter (Signed)
Pts daughter is calling in stating that she was returning the call to the office and was not sure if the call was for her mother or reason for the call. Daughter stated you can give her a call back if needed [phone] Glade Nurse)

## 2020-06-30 ENCOUNTER — Ambulatory Visit (INDEPENDENT_AMBULATORY_CARE_PROVIDER_SITE_OTHER): Payer: Medicare Other

## 2020-06-30 DIAGNOSIS — K220 Achalasia of cardia: Secondary | ICD-10-CM

## 2020-06-30 DIAGNOSIS — U071 COVID-19: Secondary | ICD-10-CM

## 2020-06-30 MED ORDER — EPINEPHRINE 0.3 MG/0.3ML IJ SOAJ: 0.3000 mg | Freq: Once | INTRAMUSCULAR | Status: AC | PRN

## 2020-06-30 MED ORDER — ALBUTEROL SULFATE HFA 108 (90 BASE) MCG/ACT IN AERS: 2.0000 | INHALATION_SPRAY | Freq: Once | RESPIRATORY_TRACT | Status: AC | PRN

## 2020-06-30 MED ORDER — SODIUM CHLORIDE 0.9 % IV SOLN: INTRAVENOUS | Status: DC | PRN

## 2020-06-30 MED ORDER — DIPHENHYDRAMINE HCL 50 MG/ML IJ SOLN: 50.0000 mg | Freq: Once | INTRAMUSCULAR | Status: AC | PRN

## 2020-06-30 MED ORDER — FAMOTIDINE IN NACL 20-0.9 MG/50ML-% IV SOLN: 20.0000 mg | Freq: Once | INTRAVENOUS | Status: AC | PRN

## 2020-06-30 MED ORDER — METHYLPREDNISOLONE SODIUM SUCC 125 MG IJ SOLR: 125.0000 mg | Freq: Once | INTRAMUSCULAR | Status: AC | PRN

## 2020-06-30 MED ORDER — BEBTELOVIMAB 175 MG/2 ML IV (EUA)
175.0000 mg | Freq: Once | INTRAMUSCULAR | Status: AC
  Administered 2020-06-30: 175 mg via INTRAVENOUS

## 2020-06-30 NOTE — Progress Notes (Signed)
Diagnosis: COVID  Provider:  Chilton Greathouse, MD  Procedure: Infusion  IV Type: Peripheral, IV Location: R Hand  Bebtelovimab, Dose: 175 mg  Infusion Start Time: 1402  Infusion Stop Time: 1403  Post Infusion IV Care: Observation period completed  Discharge: Condition: Good, Destination: Home . AVS provided to patient.   Performed by:  Marilynn Rail, RN

## 2020-06-30 NOTE — Patient Instructions (Signed)
10 Things You Can Do to Manage Your COVID-19 Symptoms at Home If you have possible or confirmed COVID-19: 1. Stay home except to get medical care. 2. Monitor your symptoms carefully. If your symptoms get worse, call your healthcare provider immediately. 3. Get rest and stay hydrated. 4. If you have a medical appointment, call the healthcare provider ahead of time and tell them that you have or may have COVID-19. 5. For medical emergencies, call 911 and notify the dispatch personnel that you have or may have COVID-19. 6. Cover your cough and sneezes with a tissue or use the inside of your elbow. 7. Wash your hands often with soap and water for at least 20 seconds or clean your hands with an alcohol-based hand sanitizer that contains at least 60% alcohol. 8. As much as possible, stay in a specific room and away from other people in your home. Also, you should use a separate bathroom, if available. If you need to be around other people in or outside of the home, wear a mask. 9. Avoid sharing personal items with other people in your household, like dishes, towels, and bedding. 10. Clean all surfaces that are touched often, like counters, tabletops, and doorknobs. Use household cleaning sprays or wipes according to the label instructions. cdc.gov/coronavirus 08/20/2019 This information is not intended to replace advice given to you by your health care provider. Make sure you discuss any questions you have with your health care provider. Document Revised: 12/06/2019 Document Reviewed: 12/06/2019 Elsevier Patient Education  2021 Elsevier Inc.  What types of side effects do monoclonal antibody drugs cause?  Common side effects  In general, the more common side effects caused by monoclonal antibody drugs include: . Allergic reactions, such as hives or itching . Flu-like signs and symptoms, including chills, fatigue, fever, and muscle aches and pains . Nausea, vomiting . Diarrhea . Skin  rashes . Low blood pressure   The CDC is recommending patients who receive monoclonal antibody treatments wait at least 90 days before being vaccinated.  Currently, there are no data on the safety and efficacy of mRNA COVID-19 vaccines in persons who received monoclonal antibodies or convalescent plasma as part of COVID-19 treatment. Based on the estimated half-life of such therapies as well as evidence suggesting that reinfection is uncommon in the 90 days after initial infection, vaccination should be deferred for at least 90 days, as a precautionary measure until additional information becomes available, to avoid interference of the antibody treatment with vaccine-induced immune responses.  

## 2020-07-25 ENCOUNTER — Encounter: Payer: Self-pay | Admitting: Adult Health

## 2020-07-26 NOTE — Telephone Encounter (Signed)
Pt is calling in to see if she can get a refill on the following Rx amphetamine-dextroamphetamine (ADDERALL) 15 MG due to her being out of it.  Pharm:  [facility]  [phone].  Pt would like to have a call once it is called.  Pt stated that she was not aware that it needs to be called in before she runs out.

## 2020-07-27 ENCOUNTER — Telehealth: Payer: Self-pay

## 2020-07-27 NOTE — Telephone Encounter (Signed)
Pt is out of medication. Do you feel comfortable with refilling medication for pt. Per Kandee Keen note she was due for a refill at last appt. But pt did not need a refill yet. Pt aware to let us know a week ahead of time before she runs out of medication. Pt verbalized understanding.

## 2020-07-27 NOTE — Telephone Encounter (Signed)
Message was routed to another provider for assistant.

## 2020-07-28 MED ORDER — AMPHETAMINE-DEXTROAMPHETAMINE 15 MG PO TABS: 15.0000 mg | ORAL_TABLET | Freq: Two times a day (BID) | ORAL | 0 refills | Status: DC

## 2020-07-28 NOTE — Telephone Encounter (Signed)
I will refill once until Avella returns.

## 2020-07-28 NOTE — Telephone Encounter (Signed)
The patient called follow up on her Rx refill. She is really needing this Rx refill.  Please advise

## 2020-07-28 NOTE — Telephone Encounter (Signed)
ENTERED IN ERROR

## 2020-07-28 NOTE — Addendum Note (Signed)
Addended by: Kristian Covey on: 07/28/2020 01:03 PM   Modules accepted: Orders

## 2020-07-28 NOTE — Telephone Encounter (Signed)
Noted  

## 2020-08-18 NOTE — Progress Notes (Signed)
[facility] [address] [place] [address] Phone: [phone] Subjective:   Melissa C[facility], am serving as a scribe for Dr. Antoine Primas.  This visit occurred during the SARS-CoV-2 public health emergency.  Safety protocols were in place, including screening questions prior to the visit, additional usage of staff PPE, and extensive cleaning of exam room while observing appropriate contact time as indicated for disinfecting solutions.   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  We discussed tart cherry extract, who may need possible allopurinol, patient encouraged him to continue a dairy diet.  Patient will follow up again in 2 to 3 months  Arthritis of the right hip.  Discussed icing regimen and home exercises.  Discussed which activities to do which wants to avoid.  Patient has moderate arthritis and has had a contralateral replacement previously.  We will continue to monitor and patient will follow up again in 2 to 3 months.  Update 08/22/2020 Melissa Cervantes is a 80 y.o. female coming in with complaint of R hip and knee pain.  Patient has known arthritic changes.  Also findings noted with CPPD.  Has been doing aquatic therapy as well as home exercises.  Was to make some diet changes as well.  Last seen in April.  Patient states that he knee has been doing ok. Some days are worse than others.   No change in hip pain since last visit. No worse though.        Past Medical History:  Diagnosis Date   Achalasia    ADHD    Allergy    Cervical dystonia    Chicken pox    Esophageal obstruction    per patient she is unable to eat solid food   Hearing loss    Meniere disease    Osteoarthritis    Spinal stenosis    Past Surgical History:  Procedure Laterality Date   CESAREAN SECTION  1971, 1979   CYST EXCISION      tailbone   HELLER MYOTOMY  08/2005   TOTAL HIP ARTHROPLASTY Left 06/2012   Social History   Socioeconomic History   Marital status: Divorced    Spouse name: Not on file   Number of children: Not on file   Years of education: Not on file   Highest education level: Not on file  Occupational History   Not on file  Tobacco Use   Smoking status: Former   Smokeless tobacco: Never  Vaping Use   Vaping Use: Never used  Substance and Sexual Activity   Alcohol use: Yes    Comment: 4   Drug use: Not Currently    Types: Marijuana   Sexual activity: Not Currently  Other Topics Concern   Not on file  Social History Narrative   Not on file   Social Determinants of Health   Financial Resource Strain: Not on file  Food Insecurity: Not on file  Transportation Needs: Not on file  Physical Activity: Not on file  Stress: Not on file  Social Connections: Not on file   Allergies  Allergen Reactions   Other     Seasonal allergies, horses, cats and mold   Family History  Problem Relation Age of Onset   Heart disease Mother    Heart disease Father    Alcohol  abuse Father    Dystonia Sister    Dystonia Brother         Current Outpatient Medications (Respiratory):    albuterol (VENTOLIN HFA) 108 (90 Base) MCG/ACT inhaler, Inhale 1-2 puffs into the lungs every 6 (six) hours as needed for wheezing or shortness of breath.   cetirizine (ZYRTEC) 10 MG chewable tablet, Chew 10 mg by mouth as needed for allergies.   fluticasone (FLONASE) 50 MCG/ACT nasal spray, Place 2 sprays into both nostrils 2 (two) times daily.   montelukast (SINGULAIR) 10 MG tablet, Take 10 mg by mouth as needed.       Current Outpatient Medications (Other):    amphetamine-dextroamphetamine (ADDERALL) 15 MG tablet, Take 1 tablet by mouth 2 (two) times daily.   clonazePAM (KLONOPIN) 0.5 MG tablet, at bedtime.  Current Facility-Administered Medications (Other):    0.9 %  sodium chloride  infusion   Reviewed prior external information including notes and imaging from  primary care provider As well as notes that were available from care everywhere and other healthcare systems.  Past medical history, social, surgical and family history all reviewed in electronic medical record.  No pertanent information unless stated regarding to the chief complaint.   Review of Systems:  No headache, visual changes, nausea, vomiting, diarrhea, constipation, dizziness, abdominal pain, skin rash, fevers, chills, night sweats, weight loss, swollen lymph nodes, , chest pain, shortness of breath, mood changes. POSITIVE muscle aches, body aches, joint swelling  Objective  Blood pressure 130/88, pulse (!) 59, height 5\' 1"  (1.549 m), weight 121 lb (54.9 kg), SpO2 96 %.   General: No apparent distress alert  HEENT: Pupils equal, extraocular movements intact  Respiratory: Patient's speak in full sentences and does not appear short of breath  Cardiovascular: No lower extremity edema, non tender, no erythema  Patient does have cream on her upper extremity and left foot. Gait mild antalgic MSK: Patient's right knee does have some mild crepitus noted.  Some lateral tracking of the patella noted.  Positive patellar grind test.  Mild instability with valgus and varus force. Right hip exam does have some decrease in range of motion in all planes.  4-5 strength of the lower extremities bilaterally.    Impression and Recommendations:    The above documentation has been reviewed and is accurate and complete , DO

## 2020-08-22 ENCOUNTER — Other Ambulatory Visit: Payer: Self-pay

## 2020-08-22 ENCOUNTER — Ambulatory Visit (INDEPENDENT_AMBULATORY_CARE_PROVIDER_SITE_OTHER): Payer: Medicare Other | Admitting: Family Medicine

## 2020-08-22 ENCOUNTER — Encounter: Payer: Self-pay | Admitting: Family Medicine

## 2020-08-22 ENCOUNTER — Telehealth: Payer: Self-pay | Admitting: Adult Health

## 2020-08-22 DIAGNOSIS — M1711 Unilateral primary osteoarthritis, right knee: Secondary | ICD-10-CM

## 2020-08-22 DIAGNOSIS — M1120 Other chondrocalcinosis, unspecified site: Secondary | ICD-10-CM

## 2020-08-22 DIAGNOSIS — M1611 Unilateral primary osteoarthritis, right hip: Secondary | ICD-10-CM

## 2020-08-22 NOTE — Assessment & Plan Note (Addendum)
Chronic problem with no improvement.  Moderate arthritic changes noted.  Patient declined any injection.  Encouraged her to wear the brace on a regular basis.  No significant change in management we will follow-up again in 2 to 3 months Total time reviewing patient's chart, previous imaging and discussing with patient 33 minutes today.

## 2020-08-22 NOTE — Telephone Encounter (Signed)
The patient is needs a referral for esophageal dilation. She has to have this done every 2 years.  Please advise

## 2020-08-22 NOTE — Telephone Encounter (Signed)
Okay for refill?    LOV 06/08/2020   Last Refill   60    QTY.    0     Refills

## 2020-08-22 NOTE — Assessment & Plan Note (Signed)
Patient does have arthritic changes noted of the hip.  We discussed again about the possibility of formal physical therapy.  Patient only went 1 time and then got COVID.  Patient does not want to restart.  Given home exercises.  Patient will avoid any surgical intervention and wants to avoid any type of injections at the moment.  Follow-up with me again in 2 to 3 months to see how patient is doing.

## 2020-08-22 NOTE — Telephone Encounter (Signed)
amphetamine-dextroamphetamine (ADDERALL) 15 MG tablet  [id number] [place], [place] - [address] Phone:  [phone]  Fax:  [phone]      

## 2020-08-22 NOTE — Assessment & Plan Note (Signed)
Patient does have the underlying CPPD.  Once again we discussed diet.  We discussed the over-the-counter medications.  He may need to consider laboratory work-up which patient declined today.  Follow-up with me again in 2 to 3 months

## 2020-08-22 NOTE — Telephone Encounter (Signed)
Okay for referral?

## 2020-08-22 NOTE — Patient Instructions (Signed)
Exercises for knee and hip Will hold on PT  Wear brace on skin See me again in 2-3 months

## 2020-08-23 ENCOUNTER — Other Ambulatory Visit: Payer: Self-pay | Admitting: Adult Health

## 2020-08-23 MED ORDER — AMPHETAMINE-DEXTROAMPHETAMINE 15 MG PO TABS: 15.0000 mg | ORAL_TABLET | Freq: Two times a day (BID) | ORAL | 0 refills | Status: DC

## 2020-08-24 ENCOUNTER — Ambulatory Visit (INDEPENDENT_AMBULATORY_CARE_PROVIDER_SITE_OTHER)
Admission: RE | Admit: 2020-08-24 | Discharge: 2020-08-24 | Disposition: A | Discharge status: Home / Self Care | Payer: Medicare Other | Source: Ambulatory Visit | Attending: Adult Health | Admitting: Adult Health

## 2020-08-24 ENCOUNTER — Encounter: Payer: Self-pay | Admitting: Adult Health

## 2020-08-24 ENCOUNTER — Telehealth (INDEPENDENT_AMBULATORY_CARE_PROVIDER_SITE_OTHER): Payer: Medicare Other | Admitting: Adult Health

## 2020-08-24 ENCOUNTER — Other Ambulatory Visit: Payer: Self-pay

## 2020-08-24 VITALS — Temp 95.5°F | Ht 61.0 in | Wt 120.0 lb

## 2020-08-24 DIAGNOSIS — R059 Cough, unspecified: Secondary | ICD-10-CM | POA: Diagnosis not present

## 2020-08-24 MED ORDER — PREDNISONE 10 MG PO TABS
ORAL_TABLET | ORAL | 0 refills | Status: DC
Start: 2020-08-24 — End: 2020-10-04

## 2020-08-24 MED ORDER — DOXYCYCLINE HYCLATE 100 MG PO CAPS: 100.0000 mg | ORAL_CAPSULE | Freq: Two times a day (BID) | ORAL | 0 refills | Status: DC

## 2020-08-24 NOTE — Progress Notes (Signed)
Virtual Visit via Video Note  I connected with [name] on 08/24/20 at 11:30 AM EDT by a video enabled telemedicine application and verified that I am speaking with the correct person using two identifiers.  Location patient: home Location provider:work or home office Persons participating in the virtual visit: patient, provider  I discussed the limitations of evaluation and management by telemedicine and the availability of in person appointments. The patient expressed understanding and agreed to proceed.   HPI: 80 year old female who was diagnosed with COVID in late May 2022.  Since that time she does have follow-up to a persistent semiproductive cough with thick green mucus when it is productive.  She also has persistent wheezing, shortness of breath, and feels as though she is unable to take a full breath.  She denies fevers or chills.  Has been using her albuterol inhaler which helps significantly but symptoms come back once the albuterol wears off.   ROS: See pertinent positives and negatives per HPI.  Past Medical History:  Diagnosis Date   Achalasia    ADHD    Allergy    Cervical dystonia    Chicken pox    Esophageal obstruction    per patient she is unable to eat solid food   Hearing loss    Meniere disease    Osteoarthritis    Spinal stenosis     Past Surgical History:  Procedure Laterality Date   CESAREAN SECTION  1971, 1979   CYST EXCISION     tailbone   HELLER MYOTOMY  08/2005   TOTAL HIP ARTHROPLASTY Left 06/2012    Family History  Problem Relation Age of Onset   Heart disease Mother    Heart disease Father    Alcohol abuse Father    Dystonia Sister    Dystonia Brother        Current Outpatient Medications:    albuterol (VENTOLIN HFA) 108 (90 Base) MCG/ACT inhaler, Inhale 1-2 puffs into the lungs every 6 (six) hours as needed for wheezing or shortness of breath., Disp: 8 g, Rfl: 3   amphetamine-dextroamphetamine (ADDERALL) 15 MG tablet, Take 1  tablet by mouth 2 (two) times daily., Disp: 60 tablet, Rfl: 0   cetirizine (ZYRTEC) 10 MG chewable tablet, Chew 10 mg by mouth as needed for allergies., Disp: , Rfl:    clonazePAM (KLONOPIN) 0.5 MG tablet, at bedtime., Disp: , Rfl:    doxycycline (VIBRAMYCIN) 100 MG capsule, Take 1 capsule (100 mg total) by mouth 2 (two) times daily., Disp: 14 capsule, Rfl: 0   fluticasone (FLONASE) 50 MCG/ACT nasal spray, Place 2 sprays into both nostrils 2 (two) times daily., Disp: , Rfl:    montelukast (SINGULAIR) 10 MG tablet, Take 10 mg by mouth as needed., Disp: , Rfl:    predniSONE (DELTASONE) 10 MG tablet, 40 mg x 3 days, 20 mg x 3 days, 10 mg x 3 days, Disp: 21 tablet, Rfl: 0  Current Facility-Administered Medications:    0.9 %  sodium chloride infusion, , Intravenous, PRN, Janetta Hora, PA-C  EXAM:  VITALS per patient if applicable:  GENERAL: alert, oriented, appears well and in no acute distress  HEENT: atraumatic, conjunttiva clear, no obvious abnormalities on inspection of external nose and ears  NECK: normal movements of the head and neck  LUNGS: on inspection no signs of respiratory distress, breathing rate appears normal, no obvious gross SOB, gasping or wheezing  CV: no obvious cyanosis  MS: moves all visible extremities without noticeable abnormality  PSYCH/NEURO: pleasant and cooperative, no obvious depression or anxiety, speech and thought processing grossly intact  ASSESSMENT AND PLAN:  Discussed the following assessment and plan:  1. Cough -Bronchitis versus pneumonia versus long COVID.  We will get x-ray treat with doxycycline and prednisone and continue to use inhaler as needed.  Follow-up if no improvement after course of medication - predniSONE (DELTASONE) 10 MG tablet; 40 mg x 3 days, 20 mg x 3 days, 10 mg x 3 days  Dispense: 21 tablet; Refill: 0 - doxycycline (VIBRAMYCIN) 100 MG capsule; Take 1 capsule (100 mg total) by mouth 2 (two) times daily.  Dispense: 14  capsule; Refill: 0 - DG Chest 2 View; Future     I discussed the assessment and treatment plan with the patient. The patient was provided an opportunity to ask questions and all were answered. The patient agreed with the plan and demonstrated an understanding of the instructions.   The patient was advised to call back or seek an in-person evaluation if the symptoms worsen or if the condition fails to improve as anticipated.   Shirline Frees, NP

## 2020-08-26 ENCOUNTER — Other Ambulatory Visit: Payer: Self-pay | Admitting: Adult Health

## 2020-08-26 DIAGNOSIS — K220 Achalasia of cardia: Secondary | ICD-10-CM

## 2020-09-19 ENCOUNTER — Telehealth: Payer: Self-pay

## 2020-09-19 ENCOUNTER — Other Ambulatory Visit: Payer: Self-pay | Admitting: Adult Health

## 2020-09-19 MED ORDER — AMPHETAMINE-DEXTROAMPHETAMINE 15 MG PO TABS: 15.0000 mg | ORAL_TABLET | Freq: Two times a day (BID) | ORAL | 0 refills | Status: DC

## 2020-09-19 NOTE — Telephone Encounter (Signed)
Okay for refill?  

## 2020-09-19 NOTE — Telephone Encounter (Signed)
Patient called requesting Rx refill amphetamine-dextroamphetamine (ADDERALL) 15 MG tablet

## 2020-10-04 ENCOUNTER — Other Ambulatory Visit: Payer: Self-pay

## 2020-10-04 ENCOUNTER — Ambulatory Visit (INDEPENDENT_AMBULATORY_CARE_PROVIDER_SITE_OTHER): Payer: Medicare Other | Admitting: Adult Health

## 2020-10-04 ENCOUNTER — Encounter: Payer: Self-pay | Admitting: Adult Health

## 2020-10-04 VITALS — BP 130/82 | HR 84 | Temp 97.8°F | Ht 61.0 in | Wt 120.0 lb

## 2020-10-04 DIAGNOSIS — H8102 Meniere's disease, left ear: Secondary | ICD-10-CM | POA: Diagnosis not present

## 2020-10-04 DIAGNOSIS — G243 Spasmodic torticollis: Secondary | ICD-10-CM | POA: Diagnosis not present

## 2020-10-04 DIAGNOSIS — F909 Attention-deficit hyperactivity disorder, unspecified type: Secondary | ICD-10-CM

## 2020-10-04 DIAGNOSIS — K220 Achalasia of cardia: Secondary | ICD-10-CM | POA: Diagnosis not present

## 2020-10-04 DIAGNOSIS — E782 Mixed hyperlipidemia: Secondary | ICD-10-CM

## 2020-10-04 DIAGNOSIS — J4520 Mild intermittent asthma, uncomplicated: Secondary | ICD-10-CM | POA: Diagnosis not present

## 2020-10-04 DIAGNOSIS — Z1159 Encounter for screening for other viral diseases: Secondary | ICD-10-CM

## 2020-10-04 LAB — CBC WITH DIFFERENTIAL/PLATELET
Basophils Absolute: 0.1 10*3/uL (ref 0.0–0.1)
Basophils Relative: 2.2 % (ref 0.0–3.0)
Eosinophils Absolute: 0.6 10*3/uL (ref 0.0–0.7)
Eosinophils Relative: 10.6 % — ABNORMAL HIGH (ref 0.0–5.0)
HCT: 39.5 % (ref 36.0–46.0)
Hemoglobin: 13.1 g/dL (ref 12.0–15.0)
Lymphocytes Relative: 34.4 % (ref 12.0–46.0)
Lymphs Abs: 1.9 10*3/uL (ref 0.7–4.0)
MCHC: 33.3 g/dL (ref 30.0–36.0)
MCV: 96.1 fl (ref 78.0–100.0)
Monocytes Absolute: 0.7 10*3/uL (ref 0.1–1.0)
Monocytes Relative: 13.3 % — ABNORMAL HIGH (ref 3.0–12.0)
Neutro Abs: 2.2 10*3/uL (ref 1.4–7.7)
Neutrophils Relative %: 39.5 % — ABNORMAL LOW (ref 43.0–77.0)
Platelets: 283 10*3/uL (ref 150.0–400.0)
RBC: 4.11 Mil/uL (ref 3.87–5.11)
RDW: 13.7 % (ref 11.5–15.5)
WBC: 5.6 10*3/uL (ref 4.0–10.5)

## 2020-10-04 LAB — COMPREHENSIVE METABOLIC PANEL
ALT: 17 U/L (ref 0–35)
AST: 26 U/L (ref 0–37)
Albumin: 4.3 g/dL (ref 3.5–5.2)
Alkaline Phosphatase: 91 U/L (ref 39–117)
BUN: 18 mg/dL (ref 6–23)
CO2: 28 mEq/L (ref 19–32)
Calcium: 9.9 mg/dL (ref 8.4–10.5)
Chloride: 106 mEq/L (ref 96–112)
Creatinine, Ser: 0.8 mg/dL (ref 0.40–1.20)
GFR: 70.19 mL/min (ref >60.00)
Glucose, Bld: 89 mg/dL (ref 70–99)
Potassium: 5.6 mEq/L — ABNORMAL HIGH (ref 3.5–5.1)
Sodium: 140 mEq/L (ref 135–145)
Total Bilirubin: 0.5 mg/dL (ref 0.2–1.2)
Total Protein: 6.4 g/dL (ref 6.0–8.3)

## 2020-10-04 LAB — LIPID PANEL
Cholesterol: 205 mg/dL — ABNORMAL HIGH (ref 0–200)
HDL: 68.6 mg/dL (ref >39.00)
LDL Cholesterol: 116 mg/dL — ABNORMAL HIGH (ref 0–99)
NonHDL: 136.55
Total CHOL/HDL Ratio: 3
Triglycerides: 103 mg/dL (ref 0.0–149.0)
VLDL: 20.6 mg/dL (ref 0.0–40.0)

## 2020-10-04 NOTE — Patient Instructions (Signed)
It was great seeing you today   We will follow up with you regarding your labs   Please let me know if you need anything. I will see you back in one year or sooner if needed

## 2020-10-04 NOTE — Progress Notes (Signed)
Subjective:    Patient ID: Melissa Cervantes, female    DOB: 05/11/1940, 80 y.o.   MRN: [id number]  HPI Patient presents for yearly preventative medicine examination. She is a pleasant 80 year old female who  has a past medical history of Achalasia, ADHD, Allergy, Cervical dystonia, Chicken pox, Esophageal obstruction, Hearing loss, Meniere disease, Osteoarthritis, and Spinal stenosis.  Cervical Dystonia -said about 22 years ago.  She reports neck spasms and head hanging down into the right.  She is seen at [facility] and receives Botox injections every 3 to 5 months, does exercises for balance and range of motion and takes Klonopin 0.5 mg nightly as an antispasmodic- prescribed by [facility].   Meniere's Disease -diagnosed in 2006.  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  She does have hearing aids in both ears and uses a noise machine.  Achalasia-noticed in 2006.  Has had multiple esophageal dilations as well as laparoscopic Heller's myotomy with full fundoplication, which resulted in permanent closure due to to tight of a suture. She has been referred to GI but has not been scheduled yet.   Adult ADHD -takes Adderall 15 mg twice daily.  She does feel controlled on this medication  Seasonal allergies/asthma-over-the-counter medication as well as Singulair as needed.  She does have a history of asthma mediated by seasonal allergies and uses an albuterol inhaler very infrequently for this.  Hyperlipidemia - has been told that she has high cholesterol in the past but has not been placed on any medications   All immunizations and health maintenance protocols were reviewed with the patient and needed orders were placed.  Appropriate screening laboratory values were ordered for the patient including screening of hyperlipidemia, renal  function and hepatic function.   Medication reconciliation,  past medical history, social history, problem list and allergies were reviewed in detail with the patient  Goals were established with regard to weight loss, exercise, and  diet in compliance with medications.  She does enjoy walking and hiking, tries to do this is much as possible.  She does eat a heart healthy diet Wt Readings from Last 3 Encounters:  10/04/20 120 lb (54.4 kg)  08/24/20 120 lb (54.4 kg)  08/22/20 121 lb (54.9 kg)     Review of Systems  HENT:  Positive for hearing loss, tinnitus and trouble swallowing.   Eyes: Negative.   Respiratory: Negative.    Cardiovascular: Negative.   Gastrointestinal: Negative.   Endocrine: Negative.   Genitourinary: Negative.   Musculoskeletal:  Positive for arthralgias, back pain, neck pain and neck stiffness.  Skin: Negative.   Allergic/Immunologic: Negative.   Neurological: Negative.   Hematological: Negative.   Psychiatric/Behavioral: Negative.    All other systems reviewed and are negative.  Past Medical History:  Diagnosis Date   Achalasia    ADHD    Allergy    Cervical dystonia    Chicken pox    Esophageal obstruction    per patient she is unable to eat solid food   Hearing loss    Meniere disease    Osteoarthritis    Spinal stenosis     Social History   Socioeconomic History   Marital status: Divorced    Spouse name: Not on file   Number of children: Not on file   Years of education:  Not on file   Highest education level: Not on file  Occupational History   Not on file  Tobacco Use   Smoking status: Former   Smokeless tobacco: Never  Vaping Use   Vaping Use: Never used  Substance and Sexual Activity   Alcohol use: Yes    Comment: 4   Drug use: Not Currently    Types: Marijuana   Sexual activity: Not Currently  Other Topics Concern   Not on file  Social History Narrative   Not on file   Social Determinants of Health   Financial  Resource Strain: Not on file  Food Insecurity: Not on file  Transportation Needs: Not on file  Physical Activity: Not on file  Stress: Not on file  Social Connections: Not on file  Intimate Partner Violence: Not on file    Past Surgical History:  Procedure Laterality Date   CESAREAN SECTION  1971, 1979   CYST EXCISION     tailbone   HELLER MYOTOMY  08/2005   TOTAL HIP ARTHROPLASTY Left 06/2012    Family History  Problem Relation Age of Onset   Heart disease Mother    Heart disease Father    Alcohol abuse Father    Dystonia Sister    Dystonia Brother     Allergies  Allergen Reactions   Other     Seasonal allergies, horses, cats and mold    Current Outpatient Medications on File Prior to Visit  Medication Sig Dispense Refill   albuterol (VENTOLIN HFA) 108 (90 Base) MCG/ACT inhaler Inhale 1-2 puffs into the lungs every 6 (six) hours as needed for wheezing or shortness of breath. 8 g 3   amphetamine-dextroamphetamine (ADDERALL) 15 MG tablet Take 1 tablet by mouth 2 (two) times daily. 60 tablet 0   cetirizine (ZYRTEC) 10 MG chewable tablet Chew 10 mg by mouth as needed for allergies.     clonazePAM (KLONOPIN) 0.5 MG tablet at bedtime.     fluticasone (FLONASE) 50 MCG/ACT nasal spray Place 2 sprays into both nostrils 2 (two) times daily.     Current Facility-Administered Medications on File Prior to Visit  Medication Dose Route Frequency Provider Last Rate Last Admin   0.9 %  sodium chloride infusion   Intravenous PRN Cline Crock R, PA-C        BP 130/82   Pulse 84   Temp 97.8 F (36.6 C) (Oral)   Ht 5\' 1"  (1.549 m)   Wt 120 lb (54.4 kg)   SpO2 97%   BMI 22.67 kg/m       Objective:   Physical Exam Vitals and nursing note reviewed.  Constitutional:      General: She is not in acute distress.    Appearance: Normal appearance. She is well-developed. She is not ill-appearing.  HENT:     Head: Normocephalic and atraumatic.     Right Ear: Tympanic membrane,  ear canal and external ear normal. There is no impacted cerumen.     Left Ear: Tympanic membrane, ear canal and external ear normal. There is no impacted cerumen.     Ears:     Comments: Hearing aids present bilaterally     Nose: Nose normal. No congestion or rhinorrhea.     Mouth/Throat:     Mouth: Mucous membranes are moist.     Pharynx: Oropharynx is clear. No oropharyngeal exudate or posterior oropharyngeal erythema.  Eyes:     General:        Right eye: No discharge.  Left eye: No discharge.     Extraocular Movements: Extraocular movements intact.     Conjunctiva/sclera: Conjunctivae normal.     Pupils: Pupils are equal, round, and reactive to light.  Neck:     Thyroid: No thyromegaly.     Vascular: No carotid bruit.     Trachea: No tracheal deviation.  Cardiovascular:     Rate and Rhythm: Normal rate and regular rhythm.     Pulses: Normal pulses.     Heart sounds: Normal heart sounds. No murmur heard.   No friction rub. No gallop.  Pulmonary:     Effort: Pulmonary effort is normal. No respiratory distress.     Breath sounds: Normal breath sounds. No stridor. No wheezing, rhonchi or rales.  Chest:     Chest wall: No tenderness.  Abdominal:     General: Abdomen is flat. Bowel sounds are normal. There is no distension.     Palpations: Abdomen is soft. There is no mass.     Tenderness: There is no abdominal tenderness. There is no right CVA tenderness, left CVA tenderness, guarding or rebound.     Hernia: No hernia is present.  Musculoskeletal:        General: No swelling, tenderness, deformity or signs of injury. Normal range of motion.     Cervical back: Normal range of motion and neck supple.     Right lower leg: No edema.     Left lower leg: No edema.  Lymphadenopathy:     Cervical: No cervical adenopathy.  Skin:    General: Skin is warm and dry.     Capillary Refill: Capillary refill takes less than 2 seconds.     Coloration: Skin is not jaundiced or pale.      Findings: No bruising, erythema, lesion or rash.  Neurological:     General: No focal deficit present.     Mental Status: She is alert and oriented to person, place, and time.     Cranial Nerves: No cranial nerve deficit.     Sensory: No sensory deficit.     Motor: No weakness.     Coordination: Coordination normal.     Gait: Gait normal.     Deep Tendon Reflexes: Reflexes normal.  Psychiatric:        Mood and Affect: Mood normal.        Behavior: Behavior normal.        Thought Content: Thought content normal.        Judgment: Judgment normal.       Assessment & Plan:  1. Achalasia - Phone number given for GI- she will call and schedule - Comprehensive metabolic panel; Future - CBC with Differential/Platelet; Future - Lipid panel; Future - Lipid panel - CBC with Differential/Platelet - Comprehensive metabolic panel  2. Meniere's disease of left ear  - Comprehensive metabolic panel; Future - CBC with Differential/Platelet; Future - Lipid panel; Future - Lipid panel - CBC with Differential/Platelet - Comprehensive metabolic panel  3. Attention deficit hyperactivity disorder (ADHD), unspecified ADHD type - Continue Adderall 15 mg BID - Controlled.   4. Mild intermittent asthma without complication - Continue Singulair and inhaler PRN   5. Cervical dystonia - [facility] - Comprehensive metabolic panel; Future - CBC with Differential/Platelet; Future - Lipid panel; Future - Lipid panel - CBC with Differential/Platelet - Comprehensive metabolic panel  6. Need for hepatitis C screening test  - Hep C Antibody  7. Mixed hyperlipidemia - Consider statin  -  Comprehensive metabolic panel; Future - CBC with Differential/Platelet; Future - Lipid panel; Future - Lipid panel - CBC with Differential/Platelet - Comprehensive metabolic panel  Shirline Frees, NP

## 2020-10-05 LAB — HEPATITIS C ANTIBODY
Hepatitis C Ab: NONREACTIVE
SIGNAL TO CUT-OFF: 0.01 (ref <1.00)

## 2020-10-27 ENCOUNTER — Telehealth: Payer: Self-pay | Admitting: Adult Health

## 2020-10-27 MED ORDER — AMPHETAMINE-DEXTROAMPHETAMINE 15 MG PO TABS: 15.0000 mg | ORAL_TABLET | Freq: Two times a day (BID) | ORAL | 0 refills | Status: DC

## 2020-10-27 MED ORDER — AMPHETAMINE-DEXTROAMPHETAMINE 15 MG PO TABS
15.0000 mg | ORAL_TABLET | Freq: Two times a day (BID) | ORAL | 0 refills | Status: DC
Start: 2020-10-27 — End: 2020-11-08

## 2020-10-27 NOTE — Telephone Encounter (Signed)
Called pt to to advised that I did not see one on file but to reach out to her GI doctor. Pt stated that she does not have a GI doctor here the last on she had was in [place] and they retired. Pt stated that they have it in her records that she had them send to [place]. Pt advised that the records go to the medical record department. Pt verbalized understanding after giving her medical records contact information.

## 2020-10-27 NOTE — Telephone Encounter (Signed)
PT called to state she needs access to her records to find out the last time she has had a endoscopy. Please advise as to when the last time was. Thank you.

## 2020-10-27 NOTE — Addendum Note (Signed)
Addended by: Gali Fetter on: 10/27/2020 03:22 PM   Modules accepted: Orders

## 2020-10-27 NOTE — Telephone Encounter (Signed)
PT called to request a refill of her amphetamine-dextroamphetamine (ADDERALL) 15 MG tablet [facility] [place] [facility]. Please advise.

## 2020-10-27 NOTE — Telephone Encounter (Signed)
Okay for refill?    Last Refill   09/2020    60 QTY.   0 Refills

## 2020-10-30 ENCOUNTER — Encounter: Payer: Self-pay | Admitting: Adult Health

## 2020-10-30 ENCOUNTER — Encounter: Payer: Self-pay | Admitting: Physician Assistant

## 2020-10-31 NOTE — Telephone Encounter (Signed)
Please advise 

## 2020-11-01 ENCOUNTER — Telehealth: Payer: Self-pay

## 2020-11-01 ENCOUNTER — Other Ambulatory Visit: Payer: Self-pay

## 2020-11-01 DIAGNOSIS — R1310 Dysphagia, unspecified: Secondary | ICD-10-CM

## 2020-11-01 DIAGNOSIS — K220 Achalasia of cardia: Secondary | ICD-10-CM

## 2020-11-01 NOTE — Telephone Encounter (Signed)
Thank you :)

## 2020-11-01 NOTE — Telephone Encounter (Signed)
Patient came to the office and hand delivered her GI records. She was referred by her PCP. She is complaining of dysphagia. History of Achalasia. Tried to contact the patient by phone to offer an earlier appointment. Per Dr Lavon Paganini, we can order a Barium Swallow (radiology to use tablet if barium is passing). No answer. Left her a message asking she return my call.

## 2020-11-01 NOTE — Telephone Encounter (Signed)
Spoke with the patient. DG esophagus at [facility] 11/10/20 arrive at 10:45 am NPO 4 hours prior. Appointment here 11/14/20 at 2:00 pm arrive 1:45 pm.

## 2020-11-08 ENCOUNTER — Other Ambulatory Visit: Payer: Self-pay

## 2020-11-08 NOTE — Progress Notes (Signed)
Adderall entry removed in error. Patient is actively on this medication

## 2020-11-09 ENCOUNTER — Telehealth: Payer: Self-pay | Admitting: *Deleted

## 2020-11-09 ENCOUNTER — Encounter: Payer: Self-pay | Admitting: Gastroenterology

## 2020-11-09 ENCOUNTER — Other Ambulatory Visit: Payer: Self-pay | Admitting: *Deleted

## 2020-11-09 ENCOUNTER — Ambulatory Visit (INDEPENDENT_AMBULATORY_CARE_PROVIDER_SITE_OTHER): Payer: Medicare Other | Admitting: Gastroenterology

## 2020-11-09 ENCOUNTER — Telehealth: Payer: Self-pay | Admitting: Gastroenterology

## 2020-11-09 VITALS — BP 110/66 | HR 67 | Ht 61.0 in | Wt 119.0 lb

## 2020-11-09 DIAGNOSIS — K220 Achalasia of cardia: Secondary | ICD-10-CM

## 2020-11-09 DIAGNOSIS — R1319 Other dysphagia: Secondary | ICD-10-CM | POA: Diagnosis not present

## 2020-11-09 DIAGNOSIS — Z9889 Other specified postprocedural states: Secondary | ICD-10-CM

## 2020-11-09 DIAGNOSIS — R053 Chronic cough: Secondary | ICD-10-CM

## 2020-11-09 DIAGNOSIS — G243 Spasmodic torticollis: Secondary | ICD-10-CM

## 2020-11-09 DIAGNOSIS — R062 Wheezing: Secondary | ICD-10-CM

## 2020-11-09 NOTE — Patient Instructions (Signed)
We will contact you to schedule an Endoscopy with Dr Meridee Score as soon as Dr Lavon Paganini speaks with Dr Meridee Score   If you are age 79 or older, your body mass index should be between 23-30. Your Body mass index is 22.48 kg/m. If this is out of the aforementioned range listed, please consider follow up with your Primary Care Provider.  If you are age 20 or younger, your body mass index should be between 19-25. Your Body mass index is 22.48 kg/m. If this is out of the aformentioned range listed, please consider follow up with your Primary Care Provider.   __________________________________________________________  The [facility] providers would like to encourage you to use [facility] to communicate with providers for non-urgent requests or questions.  Due to long hold times on the telephone, sending your provider a message by [facility] may be a faster and more efficient way to get a response.  Please allow 48 business hours for a response.  Please remember that this is for non-urgent requests.    I appreciate the  opportunity to care for you  Thank You   Marsa Aris , MD

## 2020-11-09 NOTE — Telephone Encounter (Signed)
I called the patient. No answer. Left her a voicemail advising there may not be any other options next week.  What do you advise?

## 2020-11-09 NOTE — Progress Notes (Signed)
Melissa Cervantes    [id number]    [birth year]  Primary Care Physician:Nafziger, Kandee Keen, NP  Referring Physician: Shirline Frees, NP [address] [place],  [place] [address]   Chief complaint:  Dysphagia  HPI: 80 year old female with history of cervical dystonia, diagnosed with achalasia in 2006/2007 s/p multiple esophageal dilations, Heller myotomy and Nissen fundoplication in 2007 with persistent dysphagia, she was informed that the fundoplication wrap was too tight. She did not undergo surgery to undo the Nissen fundoplication.  She has had multiple endoscopies almost had it every 1 to 2 years. All the records and previous GI work-up is not available to review during this visit, this is information based on patient history.  Patient said she has been trying to get her records, but were lost. She did have some records with her, reviewed EGD from 2007, 2009, 2012 and 2013 Last EGD in 2019, not available to review.  No mucosal changes or malignancy per patient report She has chronic cough and wheezing.  She is able to tolerate only pured diet since 2007.  She has regurgitation and difficulty swallowing. No abdominal pain or vomiting. She moved to [facility] in December 2021 from [place]. She receives Botox injections for cervical dystonia     Outpatient Encounter Medications as of 11/09/2020  Medication Sig   albuterol (VENTOLIN HFA) 108 (90 Base) MCG/ACT inhaler Inhale 1-2 puffs into the lungs every 6 (six) hours as needed for wheezing or shortness of breath.   amphetamine-dextroamphetamine (ADDERALL) 15 MG tablet Take 1 tablet by mouth 2 (two) times daily.   cetirizine (ZYRTEC) 10 MG chewable tablet Chew 10 mg by mouth as needed for allergies.   clonazePAM (KLONOPIN) 0.5 MG tablet at bedtime.   fluticasone (FLONASE) 50 MCG/ACT nasal spray Place 2 sprays into both nostrils 2 (two) times daily as needed.   Facility-Administered Encounter Medications as of  11/09/2020  Medication   0.9 %  sodium chloride infusion    Allergies as of 11/09/2020 - Review Complete 11/09/2020  Allergen Reaction Noted   Other  06/08/2020    Past Medical History:  Diagnosis Date   Achalasia    ADHD    Allergy    Cervical dystonia    Chicken pox    Esophageal obstruction    per patient she is unable to eat solid food   Hearing loss    Meniere disease    Osteoarthritis    Spinal stenosis     Past Surgical History:  Procedure Laterality Date   CESAREAN SECTION  1971, 1979   CYST EXCISION     tailbone   HELLER MYOTOMY  08/2005   NISSEN FUNDOPLICATION  2007   TOTAL HIP ARTHROPLASTY Left 06/2012    Family History  Problem Relation Age of Onset   Heart disease Mother    Heart disease Father    Alcohol abuse Father    Dystonia Sister    Dystonia Brother     Social History   Socioeconomic History   Marital status: Divorced    Spouse name: Not on file   Number of children: Not on file   Years of education: Not on file   Highest education level: Not on file  Occupational History   Not on file  Tobacco Use   Smoking status: Former   Smokeless tobacco: Never  Vaping Use   Vaping Use: Never used  Substance and Sexual Activity   Alcohol use: Yes  Comment: 4   Drug use: Not Currently    Types: Marijuana   Sexual activity: Not Currently  Other Topics Concern   Not on file  Social History Narrative   Not on file   Social Determinants of Health   Financial Resource Strain: Not on file  Food Insecurity: Not on file  Transportation Needs: Not on file  Physical Activity: Not on file  Stress: Not on file  Social Connections: Not on file  Intimate Partner Violence: Not on file      Review of systems: All other review of systems negative except as mentioned in the HPI.   Physical Exam: Vitals:   11/09/20 1055  BP: 110/66  Pulse: 67  SpO2: 96%   Body mass index is 22.48 kg/m. Gen:      No acute distress HEENT:  sclera  anicteric CV: S1-S2, regular, no murmur Lungs: Bilateral wheezing in upper lobes R>L, decreased breath sounds in the right lower lobe Abd:      soft, non-tender; no palpable masses, no distension Ext:    No edema Neuro: alert and oriented x 3 Psych: normal mood and affect  Data Reviewed:  Reviewed labs, radiology imaging, old records and pertinent past GI work up   Assessment and Plan/Recommendations:  80 year old female with history of cervical dystonia, achalasia s/p Heller myotomy and Nissen fundoplication, chronic dysphagia with complaints of chronic cough and wheezing  Will obtain barium esophagram to further evaluate the esophageal anatomy, esophageal motility and EG junction  Patient will need EGD to evaluate the esophageal mucosa and +/-balloon dilation of EG junction, per patient she has had multiple esophageal dilations with no significant change in her symptoms. She is hesitant to undergo surgery to undo the Nissen wrap Will need to schedule EGD at hospital endoscopy unit with general anesthesia, she is at risk for aspiration due to possible retained contents in the esophagus or stomach based on prior endoscopy reports We will contact patient to schedule the procedure at [facility] endoscopy unit at next available appointment The risks and benefits as well as alternatives of endoscopic procedure(s) have been discussed and reviewed. All questions answered. The patient agrees to proceed.  Chronic cough and wheezing: Possibly secondary to bronchitis/chronic aspiration.  Advised patient to use inhaler as prescribed by her PMD Use incentive spirometer If continues to have persistent symptoms, may need referral to pulmonary for further evaluation   The patient was provided an opportunity to ask questions and all were answered. The patient agreed with the plan and demonstrated an understanding of the instructions.  Iona Beard , MD    CC: Shirline Frees, NP

## 2020-11-09 NOTE — Addendum Note (Signed)
Addended by: Marlowe Kays on: 11/09/2020 04:15 PM   Modules accepted: Orders, SmartSet

## 2020-11-09 NOTE — Telephone Encounter (Signed)
KVN, I would be happy to be of assistance.  I had an opening developed for next Monday.  However, I had spoken with Patty and Rovonda to try and see if any patients my patients who are pending or scheduled later this month were available they could try and use that spot.  If it does not get used or if Patty/Rovonda do not feel anyone else can get added on please feel free to use that slot.  If we do add it just let me know if you want a savory or balloon dilation.  Thanks. GM   Patient is aware and scheduled for 11/13/2020 at 12pm at [facility] instructions given over the phone   Orders put in for Amy

## 2020-11-09 NOTE — Telephone Encounter (Signed)
Pt needs to R/S 10/10 procedure at [facility].  She will be out of town.  She is available anytime after the 11th.  Please call and advise.  Thank you.

## 2020-11-10 ENCOUNTER — Ambulatory Visit (HOSPITAL_COMMUNITY)
Admission: RE | Admit: 2020-11-10 | Discharge: 2020-11-10 | Disposition: A | Discharge status: Home / Self Care | Payer: Medicare Other | Source: Ambulatory Visit | Attending: Gastroenterology | Admitting: Gastroenterology

## 2020-11-10 ENCOUNTER — Other Ambulatory Visit: Payer: Self-pay

## 2020-11-10 ENCOUNTER — Other Ambulatory Visit: Payer: Self-pay | Admitting: Gastroenterology

## 2020-11-10 DIAGNOSIS — K220 Achalasia of cardia: Secondary | ICD-10-CM | POA: Diagnosis present

## 2020-11-10 DIAGNOSIS — R1310 Dysphagia, unspecified: Secondary | ICD-10-CM | POA: Insufficient documentation

## 2020-11-10 NOTE — Telephone Encounter (Signed)
Patient is at an imaging procedure right now. Agrees to not cancel the 11/13/20 date. Wants to talk again this afternoon.

## 2020-11-10 NOTE — Telephone Encounter (Signed)
Please inform patient if she cancels the appointment for next week, next available appt is in Dec. Thanks

## 2020-11-10 NOTE — Telephone Encounter (Signed)
Spoke with the patient. She has decided to cancel. She apologizes, says there was not a way for her to get out of her obligation. Advised the next opening is 01/30/21. She accepts this and asks to be moved from 11/13/20 to 01/30/21. Hilma Favors, RN notified.

## 2020-11-14 ENCOUNTER — Ambulatory Visit: Payer: Medicare Other | Admitting: Gastroenterology

## 2020-11-16 ENCOUNTER — Other Ambulatory Visit: Payer: Self-pay

## 2020-11-16 DIAGNOSIS — R1310 Dysphagia, unspecified: Secondary | ICD-10-CM

## 2020-11-30 ENCOUNTER — Other Ambulatory Visit: Payer: Self-pay | Admitting: Adult Health

## 2020-11-30 ENCOUNTER — Ambulatory Visit: Payer: Medicare Other | Admitting: Physician Assistant

## 2020-11-30 ENCOUNTER — Telehealth: Payer: Self-pay | Admitting: Adult Health

## 2020-11-30 MED ORDER — AMPHETAMINE-DEXTROAMPHETAMINE 15 MG PO TABS: 15.0000 mg | ORAL_TABLET | Freq: Two times a day (BID) | ORAL | 0 refills | Status: DC

## 2020-11-30 MED ORDER — AMPHETAMINE-DEXTROAMPHETAMINE 15 MG PO TABS: 15.0000 mg | ORAL_TABLET | Freq: Every day | ORAL | 0 refills | Status: DC

## 2020-11-30 NOTE — Telephone Encounter (Signed)
Pt is calling and needs a refill on generic adderall 15 mg . [facility] [id number] [name] [place] - [address] [phone]

## 2021-01-01 ENCOUNTER — Telehealth: Payer: Self-pay | Admitting: Adult Health

## 2021-01-01 NOTE — Telephone Encounter (Signed)
Pt needs refill on  amphetamine-dextroamphetamine (ADDERALL) 15 MG tablet [facility] to   [id number] [place] [place], [place] - [address] Phone:  [phone]  Fax:  [phone]

## 2021-01-02 ENCOUNTER — Emergency Department (HOSPITAL_BASED_OUTPATIENT_CLINIC_OR_DEPARTMENT_OTHER): Payer: Medicare Other | Admitting: Radiology

## 2021-01-02 ENCOUNTER — Encounter (HOSPITAL_BASED_OUTPATIENT_CLINIC_OR_DEPARTMENT_OTHER): Payer: Self-pay | Admitting: Emergency Medicine

## 2021-01-02 ENCOUNTER — Other Ambulatory Visit: Payer: Self-pay

## 2021-01-02 ENCOUNTER — Emergency Department (HOSPITAL_BASED_OUTPATIENT_CLINIC_OR_DEPARTMENT_OTHER)
Admission: EM | Admit: 2021-01-02 | Discharge: 2021-01-02 | Disposition: A | Discharge status: Home / Self Care | Payer: Medicare Other | Attending: Emergency Medicine | Admitting: Emergency Medicine

## 2021-01-02 ENCOUNTER — Emergency Department (HOSPITAL_BASED_OUTPATIENT_CLINIC_OR_DEPARTMENT_OTHER): Payer: Medicare Other

## 2021-01-02 DIAGNOSIS — S8992XA Unspecified injury of left lower leg, initial encounter: Secondary | ICD-10-CM | POA: Diagnosis present

## 2021-01-02 DIAGNOSIS — Z96642 Presence of left artificial hip joint: Secondary | ICD-10-CM | POA: Insufficient documentation

## 2021-01-02 DIAGNOSIS — S80212A Abrasion, left knee, initial encounter: Secondary | ICD-10-CM

## 2021-01-02 DIAGNOSIS — S0511XA Contusion of eyeball and orbital tissues, right eye, initial encounter: Secondary | ICD-10-CM | POA: Diagnosis not present

## 2021-01-02 DIAGNOSIS — Z23 Encounter for immunization: Secondary | ICD-10-CM | POA: Diagnosis not present

## 2021-01-02 DIAGNOSIS — S0031XA Abrasion of nose, initial encounter: Secondary | ICD-10-CM

## 2021-01-02 DIAGNOSIS — S8002XA Contusion of left knee, initial encounter: Secondary | ICD-10-CM | POA: Diagnosis not present

## 2021-01-02 DIAGNOSIS — R519 Headache, unspecified: Secondary | ICD-10-CM | POA: Insufficient documentation

## 2021-01-02 DIAGNOSIS — Z87891 Personal history of nicotine dependence: Secondary | ICD-10-CM | POA: Insufficient documentation

## 2021-01-02 DIAGNOSIS — W01198A Fall on same level from slipping, tripping and stumbling with subsequent striking against other object, initial encounter: Secondary | ICD-10-CM | POA: Diagnosis not present

## 2021-01-02 DIAGNOSIS — W19XXXA Unspecified fall, initial encounter: Secondary | ICD-10-CM

## 2021-01-02 MED ORDER — TETANUS-DIPHTH-ACELL PERTUSSIS 5-2.5-18.5 LF-MCG/0.5 IM SUSY
0.5000 mL | PREFILLED_SYRINGE | Freq: Once | INTRAMUSCULAR | Status: AC
  Administered 2021-01-02: 0.5 mL via INTRAMUSCULAR

## 2021-01-02 NOTE — ED Provider Notes (Signed)
[facility] DEPT Provider Note   CSN: [id number] Arrival date & time: 01/02/21  1316     History No chief complaint on file.   Melissa Cervantes is a 80 y.o. female.  Patient with a fall tripped and had a mechanical fall going forward.  [address] outside.  Not sure when last tetanus was.  No loss of consciousness.  Patient with pain to her nose and had bleeding from her nose.  Also pain to the face and some frontal headache.  And left knee pain with an abrasion.  Denies any neck pain lower back pain or any upper extremity pain.  No right lower extremity pain.  Patient able to ambulate.      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     Family History  Problem Relation Age of Onset   Heart disease Mother    Heart disease Father    Alcohol abuse Father    Dystonia Sister    Dystonia Brother    Colon cancer Neg Hx    Esophageal cancer Neg Hx    Stomach cancer Neg Hx     Social History   Tobacco Use   Smoking status: Former   Smokeless tobacco: Never  Scientific laboratory technician Use: Never used  Substance Use Topics   Alcohol use: Yes    Comment: 4   Drug use:  Not Currently    Types: Marijuana    Home Medications Prior to Admission medications   Medication Sig Start Date End Date Taking? Authorizing Provider  albuterol (VENTOLIN HFA) 108 (90 Base) MCG/ACT inhaler Inhale 1-2 puffs into the lungs every 6 (six) hours as needed for wheezing or shortness of breath. 06/08/20  Yes Nafziger, Tommi Rumps, NP  amphetamine-dextroamphetamine (ADDERALL) 15 MG tablet Take 1 tablet by mouth 2 (two) times daily. 11/30/20  Yes Nafziger, Tommi Rumps, NP  cetirizine (ZYRTEC) 10 MG chewable tablet Chew 10 mg by mouth as needed for allergies.   Yes [provider]  clonazePAM (KLONOPIN) 0.5 MG tablet at bedtime.   Yes [provider]  fluticasone (FLONASE) 50 MCG/ACT nasal spray Place 2 sprays into both nostrils 2 (two) times daily as needed. 04/08/20  Yes [provider]    Allergies    Other  Review of Systems   Review of Systems  Constitutional:  Negative for chills and fever.  HENT:  Negative for ear pain and sore throat.   Eyes:  Negative for pain and  visual disturbance.  Respiratory:  Negative for cough and shortness of breath.   Cardiovascular:  Negative for chest pain and palpitations.  Gastrointestinal:  Negative for abdominal pain and vomiting.  Genitourinary:  Negative for dysuria and hematuria.  Musculoskeletal:  Positive for joint swelling. Negative for arthralgias and back pain.  Skin:  Positive for wound. Negative for color change and rash.  Neurological:  Positive for headaches. Negative for seizures and syncope.  All other systems reviewed and are negative.  Physical Exam Updated Vital Signs BP (!) 183/77   Pulse 70   Temp 97.6 F (36.4 C)   Resp 16   Ht 1.549 m (5\' 1" )   Wt 53.5 kg   SpO2 98%   BMI 22.30 kg/m   Physical Exam Vitals and nursing note reviewed.  Constitutional:      General: She is not in acute distress.    Appearance: Normal appearance. She is well-developed.  HENT:     Head: Normocephalic.      Comments: Superficial abrasions to the nose.  Evidence of bleeding from the nares that is now dried blood.  No active bleeding.  Septum no septal hematoma.  Septum is midline.  May be some slight bruising under the right eye.    Mouth/Throat:     Pharynx: Oropharynx is clear.  Eyes:     Extraocular Movements: Extraocular movements intact.     Conjunctiva/sclera: Conjunctivae normal.     Pupils: Pupils are equal, round, and reactive to light.  Cardiovascular:     Rate and Rhythm: Normal rate and regular rhythm.     Heart sounds: No murmur heard. Pulmonary:     Effort: Pulmonary effort is normal. No respiratory distress.     Breath sounds: Normal breath sounds.  Abdominal:     Palpations: Abdomen is soft.     Tenderness: There is no abdominal tenderness.  Musculoskeletal:        General: Signs of injury present. No swelling.     Cervical back: Normal range of motion and neck supple. No rigidity or tenderness.     Comments: Left knee with some abrasion not actively bleeding over the patella.  Some slight soft tissue swelling.  Distally neurovascularly intact.  Dorsalis pedis pulses 2+.  Skin:    General: Skin is warm and dry.     Capillary Refill: Capillary refill takes less than 2 seconds.  Neurological:     General: No focal deficit present.     Mental Status: She is alert and oriented to person, place, and time.     Cranial Nerves: No cranial nerve deficit.     Sensory: No sensory deficit.     Motor: No weakness.  Psychiatric:        Mood and Affect: Mood normal.    ED Results / Procedures / Treatments   Labs (all labs ordered are listed, but only abnormal results are displayed) Labs Reviewed - No data to display  EKG None  Radiology CT Head Wo Contrast  Result Date: 01/02/2021 CLINICAL DATA:  Facial trauma EXAM: CT HEAD WITHOUT CONTRAST CT CERVICAL SPINE WITHOUT CONTRAST TECHNIQUE: Multidetector CT imaging of the head and cervical spine was performed following the  standard protocol without intravenous contrast. Multiplanar CT image reconstructions of the cervical spine were also generated. COMPARISON:  None. FINDINGS: CT HEAD FINDINGS Brain: There is no acute intracranial hemorrhage, mass effect, or edema. Gray-white differentiation is preserved. There is no extra-axial fluid collection. Disproportionate ventricular prominence likely reflects central volume  loss. Patchy low-density in the supratentorial white matter is nonspecific but may reflect mild chronic microvascular ischemic changes. Vascular: There is atherosclerotic calcification at the skull base. Skull: Calvarium is unremarkable. Sinuses/Orbits: Dictated separately. Other: Mastoid air cells are clear. CT CERVICAL SPINE FINDINGS Alignment: No significant listhesis. Skull base and vertebrae: Degenerative endplate irregularity. Vertebral body heights are otherwise maintained. No acute fracture. Soft tissues and spinal canal: No prevertebral fluid or swelling. No visible canal hematoma. Disc levels: Multilevel degenerative changes are present including disc space narrowing, endplate osteophytes, and facet and uncovertebral hypertrophy. Upper chest: No apical lung mass. Other: Calcified plaque at the common carotid bifurcations. IMPRESSION: No evidence of acute intracranial injury. No acute cervical spine fracture. Disproportionate ventricular prominence favored to reflect central volume loss. Normal pressure hydrocephalus could be considered in the appropriate clinical setting. Electronically Signed   By: Macy Mis M.D.   On: 01/02/2021 14:55   CT Cervical Spine Wo Contrast  Result Date: 01/02/2021 CLINICAL DATA:  Facial trauma EXAM: CT HEAD WITHOUT CONTRAST CT CERVICAL SPINE WITHOUT CONTRAST TECHNIQUE: Multidetector CT imaging of the head and cervical spine was performed following the standard protocol without intravenous contrast. Multiplanar CT image reconstructions of the cervical spine were also  generated. COMPARISON:  None. FINDINGS: CT HEAD FINDINGS Brain: There is no acute intracranial hemorrhage, mass effect, or edema. Gray-white differentiation is preserved. There is no extra-axial fluid collection. Disproportionate ventricular prominence likely reflects central volume loss. Patchy low-density in the supratentorial white matter is nonspecific but may reflect mild chronic microvascular ischemic changes. Vascular: There is atherosclerotic calcification at the skull base. Skull: Calvarium is unremarkable. Sinuses/Orbits: Dictated separately. Other: Mastoid air cells are clear. CT CERVICAL SPINE FINDINGS Alignment: No significant listhesis. Skull base and vertebrae: Degenerative endplate irregularity. Vertebral body heights are otherwise maintained. No acute fracture. Soft tissues and spinal canal: No prevertebral fluid or swelling. No visible canal hematoma. Disc levels: Multilevel degenerative changes are present including disc space narrowing, endplate osteophytes, and facet and uncovertebral hypertrophy. Upper chest: No apical lung mass. Other: Calcified plaque at the common carotid bifurcations. IMPRESSION: No evidence of acute intracranial injury. No acute cervical spine fracture. Disproportionate ventricular prominence favored to reflect central volume loss. Normal pressure hydrocephalus could be considered in the appropriate clinical setting. Electronically Signed   By: Macy Mis M.D.   On: 01/02/2021 14:55   DG Knee Complete 4 Views Left  Result Date: 01/02/2021 CLINICAL DATA:  [address] this morning.  Left knee pain. EXAM: LEFT KNEE - COMPLETE 4+ VIEW COMPARISON:  None FINDINGS: Examination limited by a clothing artifact. No obvious acute fracture or osteochondral lesion. Mild tricompartmental degenerative changes and chondrocalcinosis is noted. No definite joint effusion. IMPRESSION: 1. Limited examination due to clothing artifact. 2. No obvious acute fracture or joint effusion.  Electronically Signed   By: Marijo Sanes M.D.   On: 01/02/2021 14:53   CT Maxillofacial Wo Contrast  Result Date: 01/02/2021 CLINICAL DATA:  Facial trauma, nose pain, frontal headache EXAM: CT MAXILLOFACIAL WITHOUT CONTRAST TECHNIQUE: Multidetector CT imaging of the maxillofacial structures was performed. Multiplanar CT image reconstructions were also generated. COMPARISON:  None. FINDINGS: Osseous: There is no acute facial bone fracture. There is no suspicious osseous lesion. Orbits: There is mild left periorbital soft tissue swelling. The globe is intact. Bilateral lens implants are noted. Sinuses: There is mild mucosal thickening throughout the paranasal sinuses predominantly in the ethmoid air cells. Soft tissues: Unremarkable, aside from the above-described left periorbital soft tissue swelling. Limited intracranial:  The brain is assessed on the separately dictated CT head. IMPRESSION: 1. No acute facial bone fracture. 2. Mild left periorbital soft tissue swelling.  The globe is intact. 3. Mucosal thickening throughout the paranasal sinuses predominantly in the ethmoid air cells. Electronically Signed   By: Lesia Hausen M.D.   On: 01/02/2021 14:56    Procedures Procedures   Medications Ordered in ED Medications  Tdap (BOOSTRIX) injection 0.5 mL (has no administration in time range)    ED Course  I have reviewed the triage vital signs and the nursing notes.  Pertinent labs & imaging results that were available during my care of the patient were reviewed by me and considered in my medical decision making (see chart for details).    MDM Rules/Calculators/A&P                         CT head face and cervical spine without any acute abnormalities.  In particular no evidence of any nasal fracture.  No orbital fracture. X-ray of the left knee negative for any bony abnormalities and no effusion.  But no bony abnormalities.  Patient with good range of motion of the knee.  Does have an abrasion  on the surface.  We will have her wounds cleaned and dressed with bacitracin ointment and bandage put on the left knee.  Patient needs her tetanus updated.  Patient stable for discharge home.  Final Clinical Impression(s) / ED Diagnoses Final diagnoses:  Fall, initial encounter  Abrasion of nose, initial encounter  Contusion of left knee, initial encounter  Abrasion of left knee, initial encounter    Rx / DC Orders ED Discharge Orders     None        Vanetta Mulders, MD 01/02/21 1626

## 2021-01-02 NOTE — Discharge Instructions (Signed)
Tetanus updated today.  If your nose starts to rebleed pinch it for 20 minutes.  If that does not stop it then blow the clots out and repent for another 20 minutes of that does not stop it come in.  Apply antibiotic ointment to the nose area and your left knee.  Fresh dressing on the left knee each day.  Make an appointment follow-up with your primary care doctor as needed.  Return for any new or worse symptoms.

## 2021-01-02 NOTE — ED Notes (Signed)
Pt discharged to home. Discharge instructions have been discussed with patient and/or family members. Pt verbally acknowledges understanding d/c instructions, and endorses comprehension to checkout at registration before leaving.  °

## 2021-01-02 NOTE — ED Triage Notes (Signed)
Pt arrives pov, ambulatory to triage, reports mechanical fall this am. Denies loc, endorses nose pain, frontal HA and left knee pain. Denies change in neck pain.

## 2021-01-03 ENCOUNTER — Other Ambulatory Visit: Payer: Self-pay | Admitting: Adult Health

## 2021-01-03 MED ORDER — AMPHETAMINE-DEXTROAMPHETAMINE 15 MG PO TABS: 15.0000 mg | ORAL_TABLET | Freq: Two times a day (BID) | ORAL | 0 refills | Status: DC

## 2021-01-03 NOTE — Telephone Encounter (Signed)
Okay for refill?    amphetamine-dextroamphetamine (ADDERALL) 15 MG tablet 60 tablet 0 11/30/2020    Sig - Route: Take 1 tablet by mouth 2 (two) times daily. - Oral   Sent to pharmacy as: amphetamine-dextroamphetamine (ADDERALL) 15 MG tablet   Earliest Fill Date: 11/30/2020   Notes to Pharmacy: Month 1   E-Prescribing Status: Receipt confirmed by pharmacy (11/30/2020 12:01 PM EDT)

## 2021-01-15 NOTE — Progress Notes (Signed)
Melissa Cervantes D.[facility] [address] [place] [address] Phone: [phone]   Assessment and Plan:     1. Acute pain of left knee 2. Osteoarthritis of left knee, unspecified osteoarthritis type -Chronic with exacerbation, initial sports medicine visit - Acute flare of left knee pain after fall in the left knee without mechanical symptoms, no acute fracture seen on x-ray, largely unremarkable knee exam.  Likely representing acute flare of chronic osteoarthritis versus bony contusion - Patient elects for CSI intra-articular left knee.  Tolerated well per note below - May use Tylenol/NSAIDs for breakthrough pain - Rest for the next 1 to 2 weeks and then slowly restart activities  Procedure: Knee Joint Injection Side: Left Indication: Acute knee pain  Risks explained and consent was given verbally. The site was cleaned with alcohol prep. A needle was introduced with an anterio-lateral approach. Injection given using 90mL of 1% lidocaine without epinephrine and 41mL of kenalog 40mg /ml. This was well tolerated and resulted in symptomatic relief.  Needle was removed, hemostasis achieved, and post injection instructions were explained.   Pt was advised to call or return to clinic if these symptoms worsen or fail to improve as anticipated.    Pertinent previous records reviewed include knee x-ray from 01/02/2021, CT cervical spine from 01/02/2021, ER note from 01/02/2021   Follow Up: 4 weeks for reevaluation.  Could consider HA injection versus HEP versus PT versus ultrasound at that time   Subjective:    I, 01/04/2021, am serving as a Melissa Cervantes for Doctor Neurosurgeon  Chief Complaint: left knee pain   HPI:   01/16/21 Patient is a 80 year old female complaining of left knee pain after a fall 01/02/2021 X-ray of the left knee negative for any bony abnormalities and no effusion.  But no bony abnormalities.  Patient with good range of motion  of the knee.  Does have an abrasion on the surface. Patient states this weekend knee hurt a lot more   Radiates: no Mechanical symptoms:no Numbness/tingling:no Weakness: Aggravates:getting out of bed doing flexion hurts cant go up and down steps or curves, bending over  Treatments tried: ibuprofen has helped a lot    Relevant Historical Information: None pertinent  Additional pertinent review of systems negative.   Current Outpatient Medications:    albuterol (VENTOLIN HFA) 108 (90 Base) MCG/ACT inhaler, Inhale 1-2 puffs into the lungs every 6 (six) hours as needed for wheezing or shortness of breath., Disp: 8 g, Rfl: 3   amphetamine-dextroamphetamine (ADDERALL) 15 MG tablet, Take 1 tablet by mouth 2 (two) times daily., Disp: 60 tablet, Rfl: 0   cetirizine (ZYRTEC) 10 MG chewable tablet, Chew 10 mg by mouth as needed for allergies., Disp: , Rfl:    clonazePAM (KLONOPIN) 0.5 MG tablet, at bedtime., Disp: , Rfl:    fluticasone (FLONASE) 50 MCG/ACT nasal spray, Place 2 sprays into both nostrils 2 (two) times daily as needed., Disp: , Rfl:   Current Facility-Administered Medications:    0.9 %  sodium chloride infusion, , Intravenous, PRN, 01/04/2021, PA-C   Objective:     Vitals:   01/16/21 1115  BP: 110/80  Pulse: 83  SpO2: 99%  Weight: 120 lb (54.4 kg)  Height: 5\' 1"  (1.549 m)      Body mass index is 22.67 kg/m.    Physical Exam:    General:  awake, alert oriented, no acute distress nontoxic Skin: no suspicious lesions or rashes Neuro:sensation intact,  no deficits, strength 5/5 with no deficits, no atrophy, normal muscle tone Psych: No signs of anxiety, depression or other mood disorder  Left knee: Crepitus No swelling No deformity Neg fluid wave, joint milking ROM Flex 100, Ext 5 (painful with resistance) TTP patella, medial joint line NTTP over the quad tendon, medial fem condyle, lat fem condyle, patella tendon, tibial tuberostiy, fibular head, posterior  fossa, pes anserine bursa, gerdy's tubercle,  lateral jt line Neg anterior and posterior drawer Neg lachman Neg sag sign Negative varus stress Negative valgus stress Negative McMurray  Gait normal    Electronically signed by:  Melissa Cervantes D.[facility] 11:40 AM 01/16/21

## 2021-01-16 ENCOUNTER — Other Ambulatory Visit: Payer: Self-pay

## 2021-01-16 ENCOUNTER — Ambulatory Visit (INDEPENDENT_AMBULATORY_CARE_PROVIDER_SITE_OTHER): Payer: Medicare Other | Admitting: Sports Medicine

## 2021-01-16 VITALS — BP 110/80 | HR 83 | Ht 61.0 in | Wt 120.0 lb

## 2021-01-16 DIAGNOSIS — M25562 Pain in left knee: Secondary | ICD-10-CM | POA: Diagnosis not present

## 2021-01-16 DIAGNOSIS — M1712 Unilateral primary osteoarthritis, left knee: Secondary | ICD-10-CM | POA: Diagnosis not present

## 2021-01-16 NOTE — Patient Instructions (Addendum)
Good to see you  Tylenol or Ibuprofen as needed  Follow up in 4 weeks

## 2021-01-17 ENCOUNTER — Encounter (HOSPITAL_COMMUNITY): Payer: Self-pay | Admitting: Gastroenterology

## 2021-01-23 ENCOUNTER — Encounter: Payer: Self-pay | Admitting: Adult Health

## 2021-01-30 ENCOUNTER — Encounter (HOSPITAL_COMMUNITY): Payer: Self-pay | Admitting: Gastroenterology

## 2021-01-30 ENCOUNTER — Ambulatory Visit (HOSPITAL_COMMUNITY): Payer: Medicare Other | Admitting: Certified Registered Nurse Anesthetist

## 2021-01-30 ENCOUNTER — Ambulatory Visit (HOSPITAL_COMMUNITY)
Admission: RE | Admit: 2021-01-30 | Discharge: 2021-01-30 | Disposition: A | Discharge status: Home / Self Care | Payer: Medicare Other | Attending: Gastroenterology | Admitting: Gastroenterology

## 2021-01-30 ENCOUNTER — Other Ambulatory Visit: Payer: Self-pay

## 2021-01-30 ENCOUNTER — Encounter (HOSPITAL_COMMUNITY)
Admission: RE | Disposition: A | Discharge status: Home / Self Care | Payer: Self-pay | Source: Home / Self Care | Attending: Gastroenterology

## 2021-01-30 DIAGNOSIS — K220 Achalasia of cardia: Secondary | ICD-10-CM | POA: Diagnosis not present

## 2021-01-30 DIAGNOSIS — R1310 Dysphagia, unspecified: Secondary | ICD-10-CM

## 2021-01-30 DIAGNOSIS — Z87891 Personal history of nicotine dependence: Secondary | ICD-10-CM | POA: Diagnosis not present

## 2021-01-30 DIAGNOSIS — K2289 Other specified disease of esophagus: Secondary | ICD-10-CM | POA: Insufficient documentation

## 2021-01-30 HISTORY — PX: BALLOON DILATION: SHX5330

## 2021-01-30 HISTORY — PX: ESOPHAGOGASTRODUODENOSCOPY (EGD) WITH PROPOFOL: SHX5813

## 2021-01-30 HISTORY — PX: ESOPHAGEAL BRUSHING: SHX6842

## 2021-01-30 SURGERY — ESOPHAGOGASTRODUODENOSCOPY (EGD) WITH PROPOFOL
Anesthesia: General

## 2021-01-30 MED ORDER — SODIUM CHLORIDE 0.9 % IV SOLN: INTRAVENOUS | Status: DC

## 2021-01-30 MED ORDER — FENTANYL CITRATE (PF) 100 MCG/2ML IJ SOLN
INTRAMUSCULAR | Status: DC | PRN
  Administered 2021-01-30: 50 ug via INTRAVENOUS

## 2021-01-30 MED ORDER — FENTANYL CITRATE (PF) 100 MCG/2ML IJ SOLN
INTRAMUSCULAR | Status: AC
  Filled 2021-01-30: qty 2

## 2021-01-30 MED ORDER — LIDOCAINE 2% (20 MG/ML) 5 ML SYRINGE
INTRAMUSCULAR | Status: DC | PRN
  Administered 2021-01-30: 80 mg via INTRAVENOUS

## 2021-01-30 MED ORDER — ONDANSETRON HCL 4 MG/2ML IJ SOLN
INTRAMUSCULAR | Status: DC | PRN
  Administered 2021-01-30: 4 mg via INTRAVENOUS

## 2021-01-30 MED ORDER — PROPOFOL 10 MG/ML IV BOLUS
INTRAVENOUS | Status: DC | PRN
  Administered 2021-01-30: 150 mg via INTRAVENOUS

## 2021-01-30 MED ORDER — SUCCINYLCHOLINE CHLORIDE 200 MG/10ML IV SOSY
PREFILLED_SYRINGE | INTRAVENOUS | Status: DC | PRN
  Administered 2021-01-30: 140 mg via INTRAVENOUS

## 2021-01-30 MED ORDER — PROPOFOL 10 MG/ML IV BOLUS
INTRAVENOUS | Status: AC
  Filled 2021-01-30: qty 20

## 2021-01-30 MED ORDER — LACTATED RINGERS IV SOLN
INTRAVENOUS | Status: DC
  Administered 2021-01-30: 12:00:00 1000 mL via INTRAVENOUS

## 2021-01-30 SURGICAL SUPPLY — 15 items

## 2021-01-30 NOTE — Anesthesia Procedure Notes (Signed)
Procedure Name: Intubation Date/Time: 01/30/2021 12:09 PM Performed by: Montel Clock, CRNA Pre-anesthesia Checklist: Patient identified, Emergency Drugs available, Suction available, Patient being monitored and Timeout performed Patient Re-evaluated:Patient Re-evaluated prior to induction Oxygen Delivery Method: Circle system utilized Preoxygenation: Pre-oxygenation with 100% oxygen Induction Type: IV induction, Rapid sequence and Cricoid Pressure applied Ventilation: Mask ventilation without difficulty Laryngoscope Size: Mac and 3 Grade View: Grade II Tube type: Oral Tube size: 7.0 mm Number of attempts: 1 Airway Equipment and Method: Stylet Placement Confirmation: ETT inserted through vocal cords under direct vision, positive ETCO2 and breath sounds checked- equal and bilateral Secured at: 21 cm Tube secured with: Tape Dental Injury: Teeth and Oropharynx as per pre-operative assessment  Comments: Limited neck extension. Grade 2b with downward laryngeal pressure and ETT easily advanced.

## 2021-01-30 NOTE — H&P (Signed)
[facility] History and Physical   Primary Care Physician:  Shirline Frees, NP   Reason for Procedure:   Dysphagia, achlasia  Plan:    EGD with possible interventions     HPI: Melissa Cervantes is a 80 y.o. female with h/o achalasia s/p myotomy here with c/o dysphagia.  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 Peptic stricture vs recurrent achalasia vs neoplastic lesion. Plan for EGD with esophageal dilation and biopsies Please refer to office note 11/09/20 for additional details The risks and benefits as well as alternatives of endoscopic procedure(s) have been discussed and reviewed. All questions answered. The patient agrees to proceed.    Past Medical History:  Diagnosis Date   Achalasia    ADHD    Allergy    Cervical dystonia    Chicken pox    Esophageal obstruction    per patient she is unable to eat solid food   Hearing loss    Meniere disease    Osteoarthritis    Spinal stenosis     Past Surgical History:  Procedure Laterality Date   CESAREAN SECTION  1971, 1979   CYST EXCISION     tailbone   HELLER MYOTOMY  08/2005   NISSEN FUNDOPLICATION  2007   TOTAL HIP ARTHROPLASTY Left 06/2012    Prior to Admission medications   Medication Sig Start Date End Date Taking? Authorizing Provider  albuterol (VENTOLIN HFA) 108 (90 Base) MCG/ACT inhaler Inhale 1-2 puffs into the lungs every 6 (six) hours as needed for wheezing or shortness of breath. 06/08/20  Yes Nafziger, Kandee Keen, NP  amphetamine-dextroamphetamine (ADDERALL) 15 MG tablet Take 1 tablet by mouth 2 (two) times daily. 01/03/21  Yes Nafziger, Kandee Keen, NP  cetirizine (ZYRTEC) 10 MG chewable tablet Chew 10 mg by mouth as needed for allergies.   Yes [provider]  clonazePAM (KLONOPIN) 0.5 MG tablet Take 0.5 mg by mouth at bedtime.   Yes [provider]  fluticasone (FLONASE) 50 MCG/ACT nasal spray Place 2 sprays into both nostrils 2 (two) times daily as needed for  allergies. 04/08/20  Yes [provider]  ibuprofen (ADVIL) 100 MG chewable tablet Chew 300 mg by mouth every 6 (six) hours as needed for moderate pain.   Yes [provider]    Current Facility-Administered Medications  Medication Dose Route Frequency Provider Last Rate Last Admin   0.9 %  sodium chloride infusion   Intravenous Continuous Mansouraty, Netty Starring., MD       0.9 %  sodium chloride infusion   Intravenous Continuous Landa Mullinax, Eleonore Chiquito, MD       lactated ringers infusion   Intravenous Continuous Maribeth Jiles, Eleonore Chiquito, MD        Allergies as of 11/09/2020 - Review Complete 11/09/2020  Allergen Reaction Noted   Other  06/08/2020    Family History  Problem Relation Age of Onset   Heart disease Mother    Heart disease Father    Alcohol abuse Father    Dystonia Sister    Dystonia Brother    Colon cancer Neg Hx    Esophageal cancer Neg Hx    Stomach cancer Neg Hx     Social History   Socioeconomic History   Marital status: Divorced    Spouse name: Not on file   Number of children: Not on file   Years of education: Not on file   Highest education level: Not on file  Occupational History   Not on file  Tobacco Use  Smoking status: Former   Smokeless tobacco: Never  Building services engineer Use: Never used  Substance and Sexual Activity   Alcohol use: Yes    Comment: 4   Drug use: Not Currently    Types: Marijuana   Sexual activity: Not Currently  Other Topics Concern   Not on file  Social History Narrative   Not on file   Social Determinants of Health   Financial Resource Strain: Not on file  Food Insecurity: Not on file  Transportation Needs: Not on file  Physical Activity: Not on file  Stress: Not on file  Social Connections: Not on file  Intimate Partner Violence: Not on file    Review of Systems:  All other review of systems negative except as mentioned in the HPI.  Physical Exam: Vital signs in last 24 hours:     General:    Alert, NAD Lungs:  Clear .   Heart:  Regular rate and rhythm Abdomen:  Soft, nontender and nondistended. Neuro/Psych:  Alert and cooperative. Normal mood and affect. A and O x 3   K. Scherry Ran , MD [phone]

## 2021-01-30 NOTE — Transfer of Care (Signed)
Immediate Anesthesia Transfer of Care Note  Patient: Melissa Cervantes  Procedure(s) Performed: ESOPHAGOGASTRODUODENOSCOPY (EGD) WITH PROPOFOL BALLOON DILATION ESOPHAGEAL BRUSHING  Patient Location: Endoscopy Unit  Anesthesia Type:General  Level of Consciousness: drowsy and patient cooperative  Airway & Oxygen Therapy: Patient Spontanous Breathing and Patient connected to face mask oxygen  Post-op Assessment: Report given to RN and Post -op Vital signs reviewed and stable  Post vital signs: Reviewed and stable  Last Vitals:  Vitals Value Taken Time  BP 180/78 01/30/21 1237  Temp    Pulse 72 01/30/21 1239  Resp 20 01/30/21 1239  SpO2 100 % 01/30/21 1239  Vitals shown include unvalidated device data.  Last Pain:  Vitals:   01/30/21 1139  TempSrc: Oral  PainSc: 0-No pain         Complications: No notable events documented.

## 2021-01-30 NOTE — Discharge Instructions (Signed)
YOU HAD AN ENDOSCOPIC PROCEDURE TODAY: Refer to the procedure report and other information in the discharge instructions given to you for any specific questions about what was found during the examination. If this information does not answer your questions, please call [facility] office at [phone] to clarify.   YOU SHOULD EXPECT: Some feelings of bloating in the abdomen. Passage of more gas than usual. Walking can help get rid of the air that was put into your GI tract during the procedure and reduce the bloating. If you had a lower endoscopy (such as a colonoscopy or flexible sigmoidoscopy) you may notice spotting of blood in your stool or on the toilet paper. Some abdominal soreness may be present for a day or two, also.  DIET: Your first meal following the procedure should be a light meal and then it is ok to progress to your normal diet. A half-sandwich or bowl of soup is an example of a good first meal. Heavy or fried foods are harder to digest and may make you feel nauseous or bloated. Drink plenty of fluids but you should avoid alcoholic beverages for 24 hours. If you had a esophageal dilation, please see attached instructions for diet.    ACTIVITY: Your care partner should take you home directly after the procedure. You should plan to take it easy, moving slowly for the rest of the day. You can resume normal activity the day after the procedure however YOU SHOULD NOT DRIVE, use power tools, machinery or perform tasks that involve climbing or major physical exertion for 24 hours (because of the sedation medicines used during the test).   SYMPTOMS TO REPORT IMMEDIATELY: A gastroenterologist can be reached at any hour. Please call [phone]  for any of the following symptoms:   Following upper endoscopy (EGD, EUS, ERCP, esophageal dilation) Vomiting of blood or coffee ground material  New, significant abdominal pain  New, significant chest pain or pain under the shoulder blades  Painful or  persistently difficult swallowing  New shortness of breath  Black, tarry-looking or red, bloody stools  FOLLOW UP:  If any biopsies were taken you will be contacted by phone or by letter within the next 1-3 weeks. Call [phone]  if you have not heard about the biopsies in 3 weeks.  Please also call with any specific questions about appointments or follow up tests.  

## 2021-01-30 NOTE — Anesthesia Preprocedure Evaluation (Addendum)
Anesthesia Evaluation  Patient identified by MRN, date of birth, ID band Patient awake    Reviewed: Allergy & Precautions, H&P , NPO status , Patient's Chart, lab work & pertinent test results, reviewed documented beta blocker date and time   Airway Mallampati: II  TM Distance: >3 FB Neck ROM: full    Dental no notable dental hx. (+) Teeth Intact, Dental Advisory Given   Pulmonary neg pulmonary ROS, former smoker,    Pulmonary exam normal breath sounds clear to auscultation       Cardiovascular Exercise Tolerance: Good negative cardio ROS   Rhythm:regular Rate:Normal     Neuro/Psych PSYCHIATRIC DISORDERS negative neurological ROS     GI/Hepatic negative GI ROS, Neg liver ROS,   Endo/Other  negative endocrine ROS  Renal/GU negative Renal ROS  negative genitourinary   Musculoskeletal  (+) Arthritis ,   Abdominal   Peds  Hematology negative hematology ROS (+)   Anesthesia Other Findings   Reproductive/Obstetrics negative OB ROS                             Anesthesia Physical Anesthesia Plan  ASA: 3  Anesthesia Plan: General   Post-op Pain Management: Minimal or no pain anticipated   Induction: Intravenous  PONV Risk Score and Plan: 2  Airway Management Planned: Oral ETT  Additional Equipment: None  Intra-op Plan:   Post-operative Plan: Extubation in OR  Informed Consent: I have reviewed the patients History and Physical, chart, labs and discussed the procedure including the risks, benefits and alternatives for the proposed anesthesia with the patient or authorized representative who has indicated his/her understanding and acceptance.     Dental Advisory Given  Plan Discussed with: CRNA and Anesthesiologist  Anesthesia Plan Comments: (  )       Anesthesia Quick Evaluation

## 2021-01-30 NOTE — Anesthesia Postprocedure Evaluation (Signed)
Anesthesia Post Note  Patient: Amiylah Anastos  Procedure(s) Performed: ESOPHAGOGASTRODUODENOSCOPY (EGD) WITH PROPOFOL BALLOON DILATION ESOPHAGEAL BRUSHING     Patient location during evaluation: PACU Anesthesia Type: General Level of consciousness: awake and alert Pain management: pain level controlled Vital Signs Assessment: post-procedure vital signs reviewed and stable Respiratory status: spontaneous breathing, nonlabored ventilation, respiratory function stable and patient connected to nasal cannula oxygen Cardiovascular status: blood pressure returned to baseline and stable Postop Assessment: no apparent nausea or vomiting Anesthetic complications: no   No notable events documented.  Last Vitals:  Vitals:   01/30/21 1240 01/30/21 1250  BP: (!) 167/68 (!) 174/72  Pulse: 72 74  Resp: 20 20  Temp:    SpO2: 100% 99%    Last Pain:  Vitals:   01/30/21 1250  TempSrc:   PainSc: 0-No pain                 Loron Weimer

## 2021-01-30 NOTE — Op Note (Signed)
[facility] Patient Name: Melissa Cervantes Procedure Date: 01/30/2021 MRN: [id number] Attending MD: Napoleon Form , MD Date of Birth: 1940-09-27 CSN: [id number] Age: 80 Admit Type: Outpatient Procedure:                Upper GI endoscopy Indications:              Dysphagia, Follow-up of achalasia Providers:                Napoleon Form, MD, Norman Clay, RN, Michele Mcalpine Technician Referring MD:              Medicines:                Monitored Anesthesia Care, General Anesthesia Complications:            No immediate complications. Estimated Blood Loss:     Estimated blood loss was minimal. Procedure:                Pre-Anesthesia Assessment:                           - Prior to the procedure, a History and Physical                            was performed, and patient medications and                            allergies were reviewed. The patient's tolerance of                            previous anesthesia was also reviewed. The risks                            and benefits of the procedure and the sedation                            options and risks were discussed with the patient.                            All questions were answered, and informed consent                            was obtained. Prior Anticoagulants: The patient has                            taken no previous anticoagulant or antiplatelet                            agents. ASA Grade Assessment: III - A patient with                            severe systemic disease. After reviewing the risks  and benefits, the patient was deemed in                            satisfactory condition to undergo the procedure.                           After obtaining informed consent, the endoscope was                            passed under direct vision. Throughout the                            procedure, the patient's blood pressure, pulse, and                             oxygen saturations were monitored continuously. The                            [id number]-H190 ([id number]) Olympus endoscope was introduced                            through the mouth, and advanced to the second part                            of duodenum. The upper GI endoscopy was                            accomplished without difficulty. The patient                            tolerated the procedure well. Scope In: Scope Out: Findings:      The Z-line was regular and was found 38 cm from the incisors.      The lumen of the esophagus was mildly dilated.      Patchy, yellow plaques were found in the proximal esophagus and in the       mid esophagus. Brushings were taken for mycology and histology.      Othewise no other endoscopic abnormality was evident in the esophagus to       explain the patient's complaint of dysphagia. It was decided, however,       to proceed with dilation of the lower third of the esophagus. A TTS       dilator was passed through the scope. Dilation with an 18-19-20 mm x 8       cm CRE balloon dilator was performed to 20 mm. The dilation site was       examined following endoscope reinsertion and showed no change.      The stomach was normal.      The cardia and gastric fundus were normal on retroflexion.      The examined duodenum was normal. Impression:               - Z-line regular, 38 cm from the incisors.                           - Dilation in the entire esophagus.                           -  Esophageal plaques were found, suspicious for                            candidiasis. Biopsied.                           - No endoscopic esophageal abnormality to explain                            patient's dysphagia. Esophagus dilated. Dilated.                           - Normal stomach.                           - Normal examined duodenum. Moderate Sedation:      Not Applicable - Patient had care per Anesthesia. Recommendation:           -  Patient has a contact number available for                            emergencies. The signs and symptoms of potential                            delayed complications were discussed with the                            patient. Return to normal activities tomorrow.                            Written discharge instructions were provided to the                            patient.                           - Resume previous diet.                           - Continue present medications.                           - Await pathology results.                           - Return to GI office in 3 months. Please call to                            schedule appointment Procedure Code(s):        --- Professional ---                           [phone], Esophagogastroduodenoscopy, flexible,                            transoral; with transendoscopic balloon dilation of  esophagus (less than 30 mm diameter)                           43239, 59, Esophagogastroduodenoscopy, flexible,                            transoral; with biopsy, single or multiple Diagnosis Code(s):        --- Professional ---                           K22.8, Other specified diseases of esophagus                           K22.9, Disease of esophagus, unspecified                           R13.10, Dysphagia, unspecified                           K22.0, Achalasia of cardia CPT copyright 2019 [employer] Association. All rights reserved. The codes documented in this report are preliminary and upon coder review may  be revised to meet current compliance requirements. Napoleon Form, MD 01/30/2021 12:44:09 PM This report has been signed electronically. Number of Addenda: 0

## 2021-01-31 ENCOUNTER — Encounter (HOSPITAL_COMMUNITY): Payer: Self-pay | Admitting: Gastroenterology

## 2021-01-31 LAB — CYTOLOGY - NON PAP

## 2021-02-21 ENCOUNTER — Encounter: Payer: Self-pay | Admitting: Gastroenterology

## 2021-03-06 ENCOUNTER — Telehealth: Payer: Self-pay | Admitting: Adult Health

## 2021-03-06 MED ORDER — AMPHETAMINE-DEXTROAMPHETAMINE 15 MG PO TABS: 15.0000 mg | ORAL_TABLET | Freq: Two times a day (BID) | ORAL | 0 refills | Status: DC

## 2021-03-06 NOTE — Telephone Encounter (Signed)
Okay for refill?  

## 2021-03-06 NOTE — Telephone Encounter (Signed)
Pt call and stated she need a refill on amphetamine-dextroamphetamine (ADDERALL) 15 MG tablet sent to  [id number] [place], [place] - [address] Phone:  [phone]  Fax:  [phone]

## 2021-03-09 ENCOUNTER — Ambulatory Visit: Payer: Medicare Other | Admitting: Nurse Practitioner

## 2021-03-10 ENCOUNTER — Other Ambulatory Visit: Payer: Self-pay | Admitting: Adult Health

## 2021-03-10 DIAGNOSIS — J4520 Mild intermittent asthma, uncomplicated: Secondary | ICD-10-CM

## 2021-03-12 ENCOUNTER — Telehealth: Payer: Self-pay | Admitting: Adult Health

## 2021-03-12 NOTE — Telephone Encounter (Signed)
Pt will check with  [facility] [place], [place] - [address] Phone:  [phone]  Fax:  [phone]    To see if they have adderall in stock . Pt will callback if need be

## 2021-03-12 NOTE — Telephone Encounter (Signed)
Patient called after hours line this weekend about getting a medication filled.  LVM for pt to call the office back.

## 2021-03-13 ENCOUNTER — Ambulatory Visit (INDEPENDENT_AMBULATORY_CARE_PROVIDER_SITE_OTHER): Payer: Medicare Other | Admitting: Gastroenterology

## 2021-03-13 ENCOUNTER — Encounter: Payer: Self-pay | Admitting: Gastroenterology

## 2021-03-13 VITALS — BP 122/70 | HR 72 | Ht 61.0 in | Wt 120.0 lb

## 2021-03-13 DIAGNOSIS — J984 Other disorders of lung: Secondary | ICD-10-CM

## 2021-03-13 DIAGNOSIS — K220 Achalasia of cardia: Secondary | ICD-10-CM

## 2021-03-13 DIAGNOSIS — R1310 Dysphagia, unspecified: Secondary | ICD-10-CM | POA: Diagnosis not present

## 2021-03-13 DIAGNOSIS — B370 Candidal stomatitis: Secondary | ICD-10-CM

## 2021-03-13 MED ORDER — CLOTRIMAZOLE 10 MG MT TROC: 10.0000 mg | Freq: Three times a day (TID) | OROMUCOSAL | 1 refills | Status: DC | PRN

## 2021-03-13 MED ORDER — HYOSCYAMINE SULFATE SL 0.125 MG SL SUBL: 1.0000 | SUBLINGUAL_TABLET | Freq: Three times a day (TID) | SUBLINGUAL | 1 refills | Status: DC | PRN

## 2021-03-13 NOTE — Patient Instructions (Addendum)
We have sent medications to your pharmacy for you to pick up at your convenience.    We have placed a referral to pulmonary. They will contact you with an appointment.   I appreciate the opportunity to care for you. Marsa Aris , MD

## 2021-03-13 NOTE — Progress Notes (Signed)
Melissa Cervantes    [id number]    1940-08-29  Primary Care Physician:Nafziger, Kandee Keen, NP  Referring Physician: Shirline Frees, NP [address] [place],  [place] [address]   Chief complaint:  Dysphagia  HPI:  81 year old very pleasant female here for follow-up visit for achalasia and chronic dysphagia  Overall she feels her symptoms are stable.  She is able to eat though she has to stick to her modified diet.  Denies any weight loss She continues to have occasional choking sensation and atypical chest pain though she is having less frequent episodes s/p EGD and esophageal dilation  EGD 01/30/21 - Z-line regular, 38 cm from the incisors. - Dilation in the entire esophagus. - Esophageal plaques were found, suspicious for candidiasis. Biopsied. - No endoscopic esophageal abnormality to explain patient's dysphagia. Esophagus dilated. Dilated. - Normal stomach. - Normal examined duodenum.  She has history of cervical dystonia, diagnosed with achalasia in 2006/2007 s/p multiple esophageal dilations, Heller myotomy and Nissen fundoplication in 2007 with persistent dysphagia, she was informed that the fundoplication wrap was too tight. She did not undergo surgery to undo the Nissen fundoplication.  She has had multiple endoscopies almost had it every 1 to 2 years. Reviewed EGD from 2007, 2009, 2012 and 2013 Last EGD in 2019, not available to review.  No mucosal changes or malignancy per patient report  She has chronic cough and wheezing.  She is using incentive spirometer and she cannot go more than 500.  She has to use albuterol inhaler almost daily.  Denies any shortness of breath. She is worried about restrictive lung disease Outpatient Encounter Medications as of 03/13/2021  Medication Sig   albuterol (VENTOLIN HFA) 108 (90 Base) MCG/ACT inhaler INHALE 1-2 PUFFS BY MOUTH EVERY 6 HOURS AS NEEDED FOR WHEEZE OR SHORTNESS OF BREATH   amphetamine-dextroamphetamine  (ADDERALL) 15 MG tablet Take 1 tablet by mouth 2 (two) times daily.   amphetamine-dextroamphetamine (ADDERALL) 15 MG tablet Take 1 tablet by mouth 2 (two) times daily.   amphetamine-dextroamphetamine (ADDERALL) 15 MG tablet Take 1 tablet by mouth 2 (two) times daily.   cetirizine (ZYRTEC) 10 MG chewable tablet Chew 10 mg by mouth as needed for allergies.   clonazePAM (KLONOPIN) 0.5 MG tablet Take 0.5 mg by mouth at bedtime.   fluticasone (FLONASE) 50 MCG/ACT nasal spray Place 2 sprays into both nostrils 2 (two) times daily as needed for allergies.   ibuprofen (ADVIL) 100 MG chewable tablet Chew 300 mg by mouth every 6 (six) hours as needed for moderate pain.   Facility-Administered Encounter Medications as of 03/13/2021  Medication   0.9 %  sodium chloride infusion    Allergies as of 03/13/2021 - Review Complete 01/30/2021  Allergen Reaction Noted   Other  06/08/2020    Past Medical History:  Diagnosis Date   Achalasia    ADHD    Allergy    Cervical dystonia    Chicken pox    Esophageal obstruction    per patient she is unable to eat solid food   Hearing loss    Meniere disease    Osteoarthritis    Spinal stenosis     Past Surgical History:  Procedure Laterality Date   BALLOON DILATION N/A 01/30/2021   Procedure: BALLOON DILATION;  Surgeon: Napoleon Form, MD;  Location: [facility];  Service: Endoscopy;  Laterality: N/A;   CESAREAN SECTION  1971, 1979  CYST EXCISION     tailbone   ESOPHAGEAL BRUSHING  01/30/2021   Procedure: ESOPHAGEAL BRUSHING;  Surgeon: Napoleon Form, MD;  Location: [facility];  Service: Endoscopy;;   ESOPHAGOGASTRODUODENOSCOPY (EGD) WITH PROPOFOL N/A 01/30/2021   Procedure: ESOPHAGOGASTRODUODENOSCOPY (EGD) WITH PROPOFOL;  Surgeon: Napoleon Form, MD;  Location: [facility];  Service: Endoscopy;  Laterality: N/A;   HELLER MYOTOMY  08/2005   NISSEN FUNDOPLICATION  2007   TOTAL HIP ARTHROPLASTY Left 06/2012    Family History   Problem Relation Age of Onset   Heart disease Mother    Heart disease Father    Alcohol abuse Father    Dystonia Sister    Dystonia Brother    Colon cancer Neg Hx    Esophageal cancer Neg Hx    Stomach cancer Neg Hx     Social History   Socioeconomic History   Marital status: Divorced    Spouse name: Not on file   Number of children: Not on file   Years of education: Not on file   Highest education level: Not on file  Occupational History   Not on file  Tobacco Use   Smoking status: Former   Smokeless tobacco: Never  Vaping Use   Vaping Use: Never used  Substance and Sexual Activity   Alcohol use: Yes    Comment: 4   Drug use: Not Currently    Types: Marijuana   Sexual activity: Not Currently  Other Topics Concern   Not on file  Social History Narrative   Not on file   Social Determinants of Health   Financial Resource Strain: Not on file  Food Insecurity: Not on file  Transportation Needs: Not on file  Physical Activity: Not on file  Stress: Not on file  Social Connections: Not on file  Intimate Partner Violence: Not on file      Review of systems: All other review of systems negative except as mentioned in the HPI.   Physical Exam: Vitals:   03/13/21 0824  BP: 122/70  Pulse: 72   Body mass index is 22.67 kg/m. Gen:      No acute distress HEENT:  sclera anicteric Neuro: alert and oriented x 3 Psych: normal mood and affect  Data Reviewed:  Reviewed labs, radiology imaging, old records and pertinent past GI work up   Assessment and Plan/Recommendations:  81 year old female with history of cervical dystonia, achalasia s/p Heller myotomy and Nissen fundoplication, chronic dysphagia with complaints of chronic cough and wheezing    Chronic cough and wheezing: Possibly secondary to bronchitis/chronic aspiration.  Continue to use incentive spirometer She is using daily albuterol inhaler with persistent symptoms Refer to pulmonary for further  evaluation  Chronic dysphagia and achalasia: S/p recent EGD with esophageal dilation Overall symptoms are stable Continue with dietary modifications and antireflux measures Use clotrimazole lozenges up to 3 times daily as needed for oropharyngeal candidiasis  Return in 6 months or sooner if needed  This visit required 40 minutes of patient care (this includes precharting, chart review, review of results, face-to-face time used for counseling as well as treatment plan and follow-up. The patient was provided an opportunity to ask questions and all were answered. The patient agreed with the plan and demonstrated an understanding of the instructions.  Iona Beard , MD    CC: Shirline Frees, NP

## 2021-03-26 ENCOUNTER — Other Ambulatory Visit: Payer: Self-pay

## 2021-03-26 ENCOUNTER — Ambulatory Visit (INDEPENDENT_AMBULATORY_CARE_PROVIDER_SITE_OTHER): Payer: Medicare Other | Admitting: Sports Medicine

## 2021-03-26 ENCOUNTER — Ambulatory Visit (INDEPENDENT_AMBULATORY_CARE_PROVIDER_SITE_OTHER): Payer: Medicare Other

## 2021-03-26 VITALS — BP 130/80 | HR 104 | Ht 61.0 in | Wt 121.0 lb

## 2021-03-26 DIAGNOSIS — M1711 Unilateral primary osteoarthritis, right knee: Secondary | ICD-10-CM

## 2021-03-26 DIAGNOSIS — M25561 Pain in right knee: Secondary | ICD-10-CM

## 2021-03-26 DIAGNOSIS — M1120 Other chondrocalcinosis, unspecified site: Secondary | ICD-10-CM

## 2021-03-26 DIAGNOSIS — G8929 Other chronic pain: Secondary | ICD-10-CM

## 2021-03-26 NOTE — Progress Notes (Signed)
[name] D.[name] [address] [place] [address] Phone: [phone]   Assessment and Plan:     1. Chronic pain of right knee 2. Osteoarthritis of right knee, unspecified osteoarthritis type 3. Pseudogout -Chronic with exacerbation, initial sports medicine visit - Chronic bilateral knee pain with acute flare of right knee pain over the past 2 weeks with mild improvement using relative rest and as needed NSAIDs.  Suspect acute flare of osteoarthritis versus pseudogout - X-ray obtained in clinic.  My interpretation: Mild joint spurring and sclerosis, trace chondrocalcinosis similar to contralateral knee.  No acute fracture or dislocation -Patient elected for CSI intra-articular knee.  Tolerated well per note below - Start HEP.  Handout provided  Procedure: Knee Joint Injection Side: Right Indication: Right knee pain  Risks explained and consent was given verbally. The site was cleaned with alcohol prep. A needle was introduced with an anterio-lateral approach. Injection given using 76mL of 1% lidocaine without epinephrine and 7mL of kenalog 40mg /ml. This was well tolerated and resulted in symptomatic relief.  Needle was removed, hemostasis achieved, and post injection instructions were explained.   Pt was advised to call or return to clinic if these symptoms worsen or fail to improve as anticipated.   Pertinent previous records reviewed include none   Follow Up: 63-month follow-up.  Patient is hoping to go on a trip with her daughter to 2-month in 07/2021.  Could consider bilateral knee CSI prior to her trip   Subjective:   I, 08/2021, am serving as a Jerene Canny for Doctor Neurosurgeon  Chief Complaint: right knee pain   HPI:  03/26/21 Patient is a 81 year old female complaining of right knee pain . Patient states that her right knee is now hurting, has been in pain for about 2 weeks does not remember falling she was walking  with her friend and noted "oh my knee felt like it shifted to th"e side but she was able to keep walking, no radiating pain , pt states that it is swollen but the swelling is going down has been taking ib and icing and notes that , that has helped some, no numbness or tingling, sometimes she cant walk up the stairs and when she wakes in the morning she cant walk and doesn't feel like her knee will be able to support her   Relevant Historical Information: Osteoarthritis, pseudogout  Additional pertinent review of systems negative.   Current Outpatient Medications:    albuterol (VENTOLIN HFA) 108 (90 Base) MCG/ACT inhaler, INHALE 1-2 PUFFS BY MOUTH EVERY 6 HOURS AS NEEDED FOR WHEEZE OR SHORTNESS OF BREATH, Disp: 8.5 each, Rfl: 3   amphetamine-dextroamphetamine (ADDERALL) 15 MG tablet, Take 1 tablet by mouth 2 (two) times daily., Disp: 60 tablet, Rfl: 0   cetirizine (ZYRTEC) 10 MG chewable tablet, Chew 10 mg by mouth as needed for allergies., Disp: , Rfl:    clonazePAM (KLONOPIN) 0.5 MG tablet, Take 0.5 mg by mouth at bedtime., Disp: , Rfl:    clotrimazole (MYCELEX) 10 MG troche, Take 1 tablet (10 mg total) by mouth 3 (three) times daily as needed., Disp: 90 Troche, Rfl: 1   fluticasone (FLONASE) 50 MCG/ACT nasal spray, Place 2 sprays into both nostrils 2 (two) times daily as needed for allergies., Disp: , Rfl:    Hyoscyamine Sulfate SL (LEVSIN/SL) 0.125 MG SUBL, Place 1 tablet under the tongue 3 (three) times daily as needed., Disp: 90 tablet, Rfl: 1  Current  Facility-Administered Medications:    0.9 %  sodium chloride infusion, , Intravenous, PRN, Janetta Hora, PA-C   Objective:     Vitals:   03/26/21 1259  BP: 130/80  Pulse: (!) 104  SpO2: 96%  Weight: 121 lb (54.9 kg)  Height: 5\' 1"  (1.549 m)      Body mass index is 22.86 kg/m.    Physical Exam:    General:  awake, alert oriented, no acute distress nontoxic Skin: no suspicious lesions or rashes Neuro:sensation intact, no  deficits, strength 5/5 with no deficits, no atrophy, normal muscle tone Psych: No signs of anxiety, depression or other mood disorder  Right knee: Mild swelling No deformity  Positive fluid wave, joint milking ROM Flex 100, Ext 5 Crepitus TTP medial and lateral femoral condyles, medial and lateral joint line NTTP over the quad tendon, medial fem condyle, lat fem condyle, patella, plica, patella tendon, tibial tuberostiy, fibular head, posterior fossa, pes anserine bursa, gerdy's tubercle Neg lachman Neg sag sign Negative varus stress Negative valgus stress Negative McMurray   Gait normal    Electronically signed by:  D.[name] 2:14 PM 03/26/21

## 2021-03-26 NOTE — Progress Notes (Deleted)
° °   [name] D.[name] [address] [place] [address] Phone: [phone]   Assessment and Plan:     There are no diagnoses linked to this encounter.  ***   Pertinent previous records reviewed include ***   Follow Up: ***     Subjective:   I, Melissa Cervantes, am serving as a Neurosurgeon for Doctor Richardean Sale  Chief Complaint: left knee pain   HPI:  01/16/21 Patient is a 81 year old female complaining of left knee pain after a fall 01/02/2021 X-ray of the left knee negative for any bony abnormalities and no effusion.  But no bony abnormalities.  Patient with good range of motion of the knee.  Does have an abrasion on the surface. Patient states this weekend knee hurt a lot more    Radiates: no Mechanical symptoms:no Numbness/tingling:no Weakness: Aggravates:getting out of bed doing flexion hurts cant go up and down steps or curves, bending over  Treatments tried: ibuprofen has helped a lot    Relevant Historical Information: ***  Additional pertinent review of systems negative.   Current Outpatient Medications:    albuterol (VENTOLIN HFA) 108 (90 Base) MCG/ACT inhaler, INHALE 1-2 PUFFS BY MOUTH EVERY 6 HOURS AS NEEDED FOR WHEEZE OR SHORTNESS OF BREATH, Disp: 8.5 each, Rfl: 3   amphetamine-dextroamphetamine (ADDERALL) 15 MG tablet, Take 1 tablet by mouth 2 (two) times daily., Disp: 60 tablet, Rfl: 0   cetirizine (ZYRTEC) 10 MG chewable tablet, Chew 10 mg by mouth as needed for allergies. (Patient not taking: Reported on 03/13/2021), Disp: , Rfl:    clonazePAM (KLONOPIN) 0.5 MG tablet, Take 0.5 mg by mouth at bedtime., Disp: , Rfl:    clotrimazole (MYCELEX) 10 MG troche, Take 1 tablet (10 mg total) by mouth 3 (three) times daily as needed., Disp: 90 Troche, Rfl: 1   fluticasone (FLONASE) 50 MCG/ACT nasal spray, Place 2 sprays into both nostrils 2 (two) times daily as needed for allergies. (Patient not taking: Reported on  03/13/2021), Disp: , Rfl:    Hyoscyamine Sulfate SL (LEVSIN/SL) 0.125 MG SUBL, Place 1 tablet under the tongue 3 (three) times daily as needed., Disp: 90 tablet, Rfl: 1  Current Facility-Administered Medications:    0.9 %  sodium chloride infusion, , Intravenous, PRN, Janetta Hora, PA-C   Objective:     There were no vitals filed for this visit.    There is no height or weight on file to calculate BMI.    Physical Exam:    ***   Electronically signed by:  [name] D[name] 7:44 AM 03/26/21

## 2021-03-26 NOTE — Patient Instructions (Addendum)
Good to see you  Knee HEP 3 month follow up

## 2021-04-10 ENCOUNTER — Other Ambulatory Visit: Payer: Self-pay

## 2021-04-10 ENCOUNTER — Ambulatory Visit (INDEPENDENT_AMBULATORY_CARE_PROVIDER_SITE_OTHER): Payer: Medicare Other | Admitting: Internal Medicine

## 2021-04-10 ENCOUNTER — Encounter: Payer: Self-pay | Admitting: Internal Medicine

## 2021-04-10 VITALS — BP 126/82 | Ht 62.0 in | Wt 118.8 lb

## 2021-04-10 DIAGNOSIS — R0602 Shortness of breath: Secondary | ICD-10-CM | POA: Diagnosis not present

## 2021-04-10 DIAGNOSIS — K220 Achalasia of cardia: Secondary | ICD-10-CM | POA: Diagnosis not present

## 2021-04-10 DIAGNOSIS — J4530 Mild persistent asthma, uncomplicated: Secondary | ICD-10-CM

## 2021-04-10 LAB — POCT EXHALED NITRIC OXIDE: FeNO level (ppb): 20

## 2021-04-10 NOTE — Patient Instructions (Addendum)
Please schedule follow up scheduled with myself in 1 months.  If my schedule is not open yet, we will contact you with a reminder closer to that time. Please call [phone] if you haven't heard from [place] a month before.  ? ?Before your next visit I would like you to have: ? ?Full set of PFTs ? ?Continue albuterol inhaler as needed for chest tightness, wheezing, shortness of breath. If you are using it more frequently than twice a week we may need to consider a maintenance therapy ?

## 2021-04-10 NOTE — Progress Notes (Signed)
? ?      ?Melissa Cervantes    [id number]    18-Oct-1940 ? ?Primary Care Physician:Nafziger, Kandee Keen, NP ? ?Referring Physician: Napoleon Form, MD ?[address] ?[place],  [place] [address] ?Reason for Consultation: evaluate and treat for restrictive lung disease ?Date of Consultation: 04/10/2021 ? ?Chief complaint:   ?Chief Complaint  ?Patient presents with  ? Consult  ?  wheezing  ?  ? ?HPI: ?Melissa Cervantes is a 81 y.o. woman with history of achalasia who presents for new patient evaluation at the request of Dr. Lavon Paganini in GI.  ? ?She has provided extensive history regarding her respiratory symptoms since childhood. She has long standing allergies and asthma and currently takes albuterol only. She has symptoms of dyspnea and chest tightness provoked with exercise, tension, movement. She takes albuterol inhaler 4 times/day about a month ago for symptoms of wheezing. Now she hasn't taken it over 4 days. She was on a maintenance inhaler with flovent and has taken advair in the past. She stopped taking them because of delivery system leading - to irritation in her throat. She has been on and off montelukast and cetirizine in the past.  Has been in the emergency room twice, last in 2004 for these symptoms.  ? ?She has extensive GI history including achalasia in 2006/2007 with multiple esophageal dilations and had a nissen fundoplication in 2007 - the result of which is persistent dysphagia. Dr Lavon Paganini is having her drink water after eating and this is helping her symptoms a lot.  ? ?She also has difficulty taking a deep breath in especially when her achalasia is active.  ?She has an incentive spirometry and can't get over 500.  ?However now that her asthma is better she is walking regularly 3-4 miles/day.  ? ?Social history: ? 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  ?Smoking history: former smoker - 10 years on and off x 1 ppd = 10 pack years ? ?Social History   ? ?Occupational History  ? Not on file  ?Tobacco Use  ? Smoking status: Former  ? Smokeless tobacco: Never  ?Vaping Use  ? Vaping Use: Never used  ?Substance and Sexual Activity  ? Alcohol use: Yes  ?  Comment: 4  ? Drug use: Not Currently  ?  Types: Marijuana  ? Sexual activity: Not Currently  ? ? ?Relevant family history: ? ?Family History  ?Problem Relation Age of Onset  ? Heart disease Mother   ? Heart disease Father   ? Alcohol abuse Father   ? Dystonia Sister   ? Dystonia Brother   ? Colon cancer Neg Hx   ? Esophageal cancer Neg Hx   ? Stomach cancer Neg Hx   ? ? ?Past Medical History:  ?Diagnosis Date  ? Achalasia   ? ADHD   ? Allergy   ? Cervical dystonia   ? Chicken pox   ? Esophageal obstruction   ? per patient she is unable to eat solid food  ? Hearing loss   ? Meniere disease   ? Osteoarthritis   ? Spinal stenosis   ? ? ?Past Surgical History:  ?Procedure Laterality Date  ? BALLOON DILATION N/A 01/30/2021  ? Procedure: BALLOON DILATION;  Surgeon: Napoleon Form, MD;  Location: [facility];  Service: Endoscopy;  Laterality: N/A;  ? CESAREAN SECTION  1971, 1979  ? CYST EXCISION    ? tailbone  ? ESOPHAGEAL BRUSHING  01/30/2021  ? Procedure: ESOPHAGEAL BRUSHING;  Surgeon: Napoleon Form, MD;  Location: [facility];  Service: Endoscopy;;  ? ESOPHAGOGASTRODUODENOSCOPY (EGD) WITH PROPOFOL N/A 01/30/2021  ? Procedure: ESOPHAGOGASTRODUODENOSCOPY (EGD) WITH PROPOFOL;  Surgeon: Napoleon Form, MD;  Location: [facility];  Service: Endoscopy;  Laterality: N/A;  ? HELLER MYOTOMY  08/2005  ? NISSEN FUNDOPLICATION  2007  ? TOTAL HIP ARTHROPLASTY Left 06/2012  ? ? ? ?Physical Exam: ?Blood pressure 126/82, height 5\' 2"  (1.575 m), weight 118 lb 12.8 oz (53.9 kg), SpO2 100 %. ?Gen:      No acute distress mild kyphosis ?ENT:  no nasal polyps, mucus membranes moist ?Lungs:    No increased respiratory effort, symmetric chest wall excursion, clear to auscultation bilaterally, no wheezes or crackles ?CV:          Regular rate and rhythm; no murmurs, rubs, or gallops.  No pedal edema ?Abd:      + bowel sounds; soft, non-tender; no distension ?MSK: no acute synovitis of DIP or PIP joints, no mechanics hands.  ?Skin:      Warm and dry; no rashes ?Neuro: normal speech, no focal facial asymmetry ?Psych: alert and oriented x3, normal mood and affect ? ? ?Data Reviewed/Medical Decision Making: ? ?Independent interpretation of tests: ?Imaging: ? Review of patient's chest xray July 2022 images revealed no acute process. The patient's images have been independently reviewed by me.   ? ?PFTs: ? ?No flowsheet data found. ? ?Labs:  ?Lab Results  ?Component Value Date  ? WBC 5.6 10/04/2020  ? HGB 13.1 10/04/2020  ? HCT 39.5 10/04/2020  ? MCV 96.1 10/04/2020  ? PLT 283.0 10/04/2020  ? ?Lab Results  ?Component Value Date  ? NA 140 10/04/2020  ? K 5.6 No hemolysis seen (H) 10/04/2020  ? CL 106 10/04/2020  ? CO2 28 10/04/2020  ? ? ? ?Immunization status:  ?Immunization History  ?Administered Date(s) Administered  ? Influenza-Unspecified 10/06/2019  ? Moderna Sars-Covid-2 Vaccination 03/08/2019, 04/07/2019, 12/03/2019  ? Pneumococcal Conjugate-13 10/06/2019  ? Pneumococcal Polysaccharide-23 10/06/2018  ? Tdap 01/02/2021  ? Zoster Recombinat (Shingrix) 10/06/2018, 10/06/2019  ? ? ? I reviewed prior external note(s) from GI ? I reviewed the result(s) of the labs and imaging as noted above.  ? I have ordered PFT ? ?Discussion of management or test interpretation with another colleague.  ? 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 ? ?Plan/Recommendations: ? ?Will obtain PFTS to evaluate further her dyspnea symptoms. Overall concern for ILD is lower given normal chest xray and absence of crackles on exam. ? ?FeNO obtained today 20 ppb.  ? ?Continue exercise and activity as tolerated ? ?Continue albuterol inhaler as needed for chest tightness, wheezing, shortness of breath. If  you are using it more frequently than twice a week we may need to consider a maintenance therapy ? ?Return to Care: ?Return in about 4 weeks (around 05/08/2021). ? ?07/08/2021, MD ?Pulmonary and Critical Care Medicine ?[facility] ?Office:[phone] ? ?CC: Durel Salts, MD ? ? ? ?

## 2021-05-08 ENCOUNTER — Telehealth: Payer: Self-pay | Admitting: Adult Health

## 2021-05-08 MED ORDER — AMPHETAMINE-DEXTROAMPHETAMINE 15 MG PO TABS: 15.0000 mg | ORAL_TABLET | Freq: Two times a day (BID) | ORAL | 0 refills | Status: DC

## 2021-05-08 NOTE — Telephone Encounter (Signed)
Okay for refill?  

## 2021-05-08 NOTE — Telephone Encounter (Signed)
Called pt to schedule a visit for medication refill. Pt has been scheduled. ?

## 2021-05-08 NOTE — Telephone Encounter (Signed)
Patient called to get refill on amphetamine-dextroamphetamine (ADDERALL) 15 MG tablet ? ? ? ? ? ? ?Please send to  ?[id number] [place], [place] - [address] Phone:  [phone]  ?Fax:  [phone]  ?  ? ? ? ? ? ? ? ?Please advise  ? ? ? ?

## 2021-05-09 NOTE — Telephone Encounter (Signed)
Rx refilled electronically °

## 2021-05-15 ENCOUNTER — Telehealth: Payer: Self-pay | Admitting: Adult Health

## 2021-05-15 NOTE — Telephone Encounter (Signed)
Left message for patient to call back and schedule Medicare Annual Wellness Visit (AWV) either virtually or in office. Left  my Melissa Cervantes number [phone] ? ? ?Awvi 03/07/21 per [place]  please schedule at anytime with [facility] Nurse Health Advisor 1 or 2 ? ? ?This should be a 45 minute visit.  ?

## 2021-05-18 ENCOUNTER — Telehealth: Payer: Self-pay

## 2021-05-18 ENCOUNTER — Ambulatory Visit (INDEPENDENT_AMBULATORY_CARE_PROVIDER_SITE_OTHER): Payer: Medicare Other

## 2021-05-18 VITALS — Ht 64.0 in | Wt 118.0 lb

## 2021-05-18 DIAGNOSIS — Z0000 Encounter for general adult medical examination without abnormal findings: Secondary | ICD-10-CM

## 2021-05-18 NOTE — Progress Notes (Addendum)
? ?Subjective:  ? Melissa Cervantes is a 81 y.o. female who presents for Medicare Annual (Subsequent) preventive examination. ? ?Review of Systems    ?Virtual Visit via Telephone Note ? ?I connected with  Melissa Cervantes on 05/18/21 at  1:00 PM EDT by telephone and verified that I am speaking with the correct person using two identifiers. ? ?Location: ?Patient: Home ?Provider: Office ?Persons participating in the virtual visit: patient/Nurse Health Advisor ?  ?I discussed the limitations, risks, security and privacy concerns of performing an evaluation and management service by telephone and the availability of in person appointments. The patient expressed understanding and agreed to proceed. ? ?Interactive audio and video telecommunications were attempted between this nurse and patient, however failed, due to patient having technical difficulties OR patient did not have access to video capability.  We continued and completed visit with audio only. ? ?Some vital signs may be absent or patient reported.  ? ?Criselda Peaches, LPN  ?Cardiac Risk Factors include: advanced age (>70men, >54 women) ? ?   ?Objective:  ?  ?Today's Vitals  ? 05/18/21 1314  ?Weight: 118 lb (53.5 kg)  ?Height: 5\' 4"  (1.626 m)  ? ?Body mass index is 20.25 kg/m?. ? ? ?  01/30/2021  ? 11:26 AM 01/02/2021  ?  1:54 PM 06/25/2020  ?  8:25 PM 06/21/2020  ?  2:16 PM  ?Advanced Directives  ?Does Patient Have a Medical Advance Directive? No No No No  ?Would patient like information on creating a medical advance directive? No - Patient declined  No - Patient declined Yes (Inpatient - patient defers creating a medical advance directive at this time - Information given)  ? ? ?Current Medications (verified) ?Outpatient Encounter Medications as of 05/18/2021  ?Medication Sig  ? albuterol (VENTOLIN HFA) 108 (90 Base) MCG/ACT inhaler INHALE 1-2 PUFFS BY MOUTH EVERY 6 HOURS AS NEEDED FOR WHEEZE OR SHORTNESS OF BREATH  ? amphetamine-dextroamphetamine (ADDERALL) 15 MG  tablet Take 1 tablet by mouth 2 (two) times daily.  ? cetirizine (ZYRTEC) 10 MG chewable tablet Chew 10 mg by mouth as needed for allergies.  ? clonazePAM (KLONOPIN) 0.5 MG tablet Take 0.5 mg by mouth at bedtime.  ? clotrimazole (MYCELEX) 10 MG troche Take 1 tablet (10 mg total) by mouth 3 (three) times daily as needed.  ? fluticasone (FLONASE) 50 MCG/ACT nasal spray Place 2 sprays into both nostrils 2 (two) times daily as needed for allergies.  ? Hyoscyamine Sulfate SL (LEVSIN/SL) 0.125 MG SUBL Place 1 tablet under the tongue 3 (three) times daily as needed.  ? ?Facility-Administered Encounter Medications as of 05/18/2021  ?Medication  ? 0.9 %  sodium chloride infusion  ? ? ?Allergies (verified) ?Other  ? ?History: ?Past Medical History:  ?Diagnosis Date  ? Achalasia   ? ADHD   ? Allergy   ? Cervical dystonia   ? Chicken pox   ? Esophageal obstruction   ? per patient she is unable to eat solid food  ? Hearing loss   ? Meniere disease   ? Osteoarthritis   ? Spinal stenosis   ? ?Past Surgical History:  ?Procedure Laterality Date  ? BALLOON DILATION N/A 01/30/2021  ? Procedure: BALLOON DILATION;  Surgeon: Mauri Pole, MD;  Location: [facility];  Service: Endoscopy;  Laterality: N/A;  ? [place]  ? CYST EXCISION    ? tailbone  ? ESOPHAGEAL BRUSHING  01/30/2021  ? Procedure: ESOPHAGEAL BRUSHING;  Surgeon: Mauri Pole, MD;  Location: [facility];  Service: Endoscopy;;  ? ESOPHAGOGASTRODUODENOSCOPY (EGD) WITH PROPOFOL N/A 01/30/2021  ? Procedure: ESOPHAGOGASTRODUODENOSCOPY (EGD) WITH PROPOFOL;  Surgeon: Mauri Pole, MD;  Location: [facility];  Service: Endoscopy;  Laterality: N/A;  ? HELLER MYOTOMY  08/2005  ? NISSEN FUNDOPLICATION  [id number]  ? TOTAL HIP ARTHROPLASTY Left 06/2012  ? ?Family History  ?Problem Relation Age of Onset  ? Heart disease Mother   ? Heart disease Father   ? Alcohol abuse Father   ? Dystonia Sister   ? Dystonia Brother   ? Colon cancer Neg Hx   ?  Esophageal cancer Neg Hx   ? Stomach cancer Neg Hx   ? ?Social History  ? ?Socioeconomic History  ? Marital status: Divorced  ?  Spouse name: Not on file  ? Number of children: Not on file  ? Years of education: Not on file  ? Highest education level: Not on file  ?Occupational History  ? Not on file  ?Tobacco Use  ? Smoking status: Former  ? Smokeless tobacco: Never  ?Vaping Use  ? Vaping Use: Never used  ?Substance and Sexual Activity  ? Alcohol use: Yes  ?  Comment: 4  ? Drug use: Not Currently  ?  Types: Marijuana  ? Sexual activity: Not Currently  ?Other Topics Concern  ? Not on file  ?Social History Narrative  ? Not on file  ? ?Social Determinants of Health  ? ?Financial Resource Strain: Low Risk   ? Difficulty of Paying Living Expenses: Not very hard  ?Food Insecurity: No Food Insecurity  ? Worried About Charity fundraiser in the Last Year: Never true  ? Ran Out of Food in the Last Year: Never true  ?Transportation Needs: No Transportation Needs  ? Lack of Transportation (Medical): No  ? Lack of Transportation (Non-Medical): No  ?Physical Activity: Sufficiently Active  ? Days of Exercise per Week: 3 days  ? Minutes of Exercise per Session: 50 min  ?Stress: No Stress Concern Present  ? Feeling of Stress : Only a little  ?Social Connections: Moderately Integrated  ? Frequency of Communication with Friends and Family: More than three times a week  ? Frequency of Social Gatherings with Friends and Family: Three times a week  ? Attends Religious Services: More than 4 times per year  ? Active Member of Clubs or Organizations: Yes  ? Attends Archivist Meetings: More than 4 times per year  ? Marital Status: Divorced  ? ? ?Tobacco Counseling ?Counseling given: Not Answered ? ? ?Clinical Intake: ? ?Pre-visit preparation completed: Yes ? ?Pain : No/denies pain ? ?  ? ?BMI - recorded: 21.72 ?Nutritional Status: BMI of 19-24  Normal ?Nutritional Risks: None ?Diabetes: No ? ?How often do you need to have  someone help you when you read instructions, pamphlets, or other written materials from your doctor or pharmacy?: 1 - Never ? ?Diabetic?  No ? ?Activities of Daily Living ? ?  05/18/2021  ?  1:27 PM 05/18/2021  ?  1:23 PM  ?In your present state of health, do you have any difficulty performing the following activities:  ?Hearing? 1 1  ?Vision? 0 0  ?Difficulty concentrating or making decisions? 0 0  ?Walking or climbing stairs? 0 0  ?Dressing or bathing? 0 0  ?Doing errands, shopping? 0 0  ?Preparing Food and eating ? N   ?Using the Toilet? N   ?In the past six months, have you accidently leaked urine? N   ?  Do you have problems with loss of bowel control? N   ?Managing your Medications? N   ?Managing your Finances? N   ?Housekeeping or managing your Housekeeping? N   ? ? ?Patient Care Team: ?Dorothyann Peng, NP as PCP - General (Family Medicine) ? ?Indicate any recent Medical Services you may have received from other than [facility] providers in the past year (date may be approximate). ? ?   ?Assessment:  ? This is a routine wellness examination for [place]. ? ?Hearing/Vision screen ?Vision Screening - Comments:: Wears glasses. ? ?Dietary issues and exercise activities discussed: ?Exercise limited by: None identified ? ? Goals Addressed   ?None ?  ? ?Depression Screen ? ?  05/18/2021  ?  1:20 PM 10/04/2020  ? 10:14 AM  ?PHQ 2/9 Scores  ?PHQ - 2 Score 0 0  ?  ?Fall Risk ? ?  05/18/2021  ?  1:28 PM 05/15/2021  ?  5:56 PM  ?Fall Risk   ?Falls in the past year? 0 0  ?Number falls in past yr: 0 0  ?Injury with Fall? 0 0  ?Risk for fall due to : No Fall Risks   ? ? ?FALL RISK PREVENTION PERTAINING TO THE HOME: ? ? ?ASSISTIVE DEVICES UTILIZED TO PREVENT FALLS: ? ? ?TIMED UP AND GO: ? ? ?Cognitive Function: ?  ?  ?  ? ?Immunizations ?Immunization History  ?Administered Date(s) Administered  ? Influenza-Unspecified 10/06/2019  ? Moderna Sars-Covid-2 Vaccination 03/08/2019, 04/07/2019, 12/03/2019  ? Pneumococcal Conjugate-13 10/06/2019  ?  Pneumococcal Polysaccharide-23 10/06/2018  ? Tdap 01/02/2021  ? Zoster Recombinat (Shingrix) 10/06/2018, 10/06/2019  ? ? ?Screening Tests ?Health Maintenance  ?Topic Date Due  ? COVID-19 Vaccine (4 - Booster for Mode

## 2021-05-18 NOTE — Patient Instructions (Addendum)
?[name] , ?Thank you for taking time to come for your Medicare Wellness Visit. I appreciate your ongoing commitment to your health goals. Please review the following plan we discussed and let me know if I can assist you in the future.  ? ?These are the goals we discussed: ? Goals   ?None ?  ?  ?This is a list of the screening recommended for you and due dates:  ?Health Maintenance  ?Topic Date Due  ? COVID-19 Vaccine (4 - Booster for Moderna series) 06/03/2021*  ? Flu Shot  09/04/2021  ? Tetanus Vaccine  01/03/2031  ? Pneumonia Vaccine  Completed  ? DEXA scan (bone density measurement)  Completed  ? Hepatitis C Screening: USPSTF Recommendation to screen - Ages 6018-79 yo.  Completed  ? Zoster (Shingles) Vaccine  Completed  ? HPV Vaccine  Aged Out  ?*Topic was postponed. The date shown is not the original due date.  ? ?Next appointment: Follow up in one year for your annual wellness visit  ? ? ?Preventive Care 165 Years and Older, Female ?Preventive care refers to lifestyle choices and visits with your health care provider that can promote health and wellness. ?What does preventive care include? ?A yearly physical exam. This is also called an annual well check. ?Dental exams once or twice a year. ?Routine eye exams. Ask your health care provider how often you should have your eyes checked. ?Personal lifestyle choices, including: ?Daily care of your teeth and gums. ?Regular physical activity. ?Eating a healthy diet. ?Avoiding tobacco and drug use. ?Limiting alcohol use. ?Practicing safe sex. ?Taking low-dose aspirin every day. ?Taking vitamin and mineral supplements as recommended by your health care provider. ?What happens during an annual well check? ?The services and screenings done by your health care provider during your annual well check will depend on your age, overall health, lifestyle risk factors, and family history of disease. ?Counseling  ?Your health care provider may ask you questions about  your: ?Alcohol use. ?Tobacco use. ?Drug use. ?Emotional well-being. ?Home and relationship well-being. ?Sexual activity. ?Eating habits. ?History of falls. ?Memory and ability to understand (cognition). ?Work and work Astronomerenvironment. ?Reproductive health. ?Screening  ?You may have the following tests or measurements: ?Height, weight, and BMI. ?Blood pressure. ?Lipid and cholesterol levels. These may be checked every 5 years, or more frequently if you are over 80 years old. ?Skin check. ?Lung cancer screening. You may have this screening every year starting at age 80 if you have a 30-pack-year history of smoking and currently smoke or have quit within the past 15 years. ?Fecal occult blood test (FOBT) of the stool. You may have this test every year starting at age 80. ?Flexible sigmoidoscopy or colonoscopy. You may have a sigmoidoscopy every 5 years or a colonoscopy every 10 years starting at age 80. ?Hepatitis C blood test. ?Hepatitis B blood test. ?Sexually transmitted disease (STD) testing. ?Diabetes screening. This is done by checking your blood sugar (glucose) after you have not eaten for a while (fasting). You may have this done every 1-3 years. ?Bone density scan. This is done to screen for osteoporosis. You may have this done starting at age 80. ?Mammogram. This may be done every 1-2 years. Talk to your health care provider about how often you should have regular mammograms. ?Talk with your health care provider about your test results, treatment options, and if necessary, the need for more tests. ?Vaccines  ?Your health care provider may recommend certain vaccines, such as: ?Influenza vaccine. This  is recommended every year. ?Tetanus, diphtheria, and acellular pertussis (Tdap, Td) vaccine. You may need a Td booster every 10 years. ?Zoster vaccine. You may need this after age 24. ?Pneumococcal 13-valent conjugate (PCV13) vaccine. One dose is recommended after age 54. ?Pneumococcal polysaccharide (PPSV23) vaccine.  One dose is recommended after age 40. ?Talk to your health care provider about which screenings and vaccines you need and how often you need them. ?This information is not intended to replace advice given to you by your health care provider. Make sure you discuss any questions you have with your health care provider. ?Document Released: 02/17/2015 Document Revised: 10/11/2015 Document Reviewed: 11/22/2014 ?Elsevier Interactive Patient Education ? 2017 [employer] ? ?Fall Prevention in the Home ?Falls can cause injuries. They can happen to people of all ages. There are many things you can do to make your home safe and to help prevent falls. ?What can I do on the outside of my home? ?Regularly fix the edges of walkways and driveways and fix any cracks. ?Remove anything that might make you trip as you walk through a door, such as a raised step or threshold. ?Trim any bushes or trees on the path to your home. ?Use bright outdoor lighting. ?Clear any walking paths of anything that might make someone trip, such as rocks or tools. ?Regularly check to see if handrails are loose or broken. Make sure that both sides of any steps have handrails. ?Any raised decks and porches should have guardrails on the edges. ?Have any leaves, snow, or ice cleared regularly. ?Use sand or salt on walking paths during winter. ?Clean up any spills in your garage right away. This includes oil or grease spills. ?What can I do in the bathroom? ?Use night lights. ?Install grab bars by the toilet and in the tub and shower. Do not use towel bars as grab bars. ?Use non-skid mats or decals in the tub or shower. ?If you need to sit down in the shower, use a plastic, non-slip stool. ?Keep the floor dry. Clean up any water that spills on the floor as soon as it happens. ?Remove soap buildup in the tub or shower regularly. ?Attach bath mats securely with double-sided non-slip rug tape. ?Do not have throw rugs and other things on the floor that can make  you trip. ?What can I do in the bedroom? ?Use night lights. ?Make sure that you have a light by your bed that is easy to reach. ?Do not use any sheets or blankets that are too big for your bed. They should not hang down onto the floor. ?Have a firm chair that has side arms. You can use this for support while you get dressed. ?Do not have throw rugs and other things on the floor that can make you trip. ?What can I do in the kitchen? ?Clean up any spills right away. ?Avoid walking on wet floors. ?Keep items that you use a lot in easy-to-reach places. ?If you need to reach something above you, use a strong step stool that has a grab bar. ?Keep electrical cords out of the way. ?Do not use floor polish or wax that makes floors slippery. If you must use wax, use non-skid floor wax. ?Do not have throw rugs and other things on the floor that can make you trip. ?What can I do with my stairs? ?Do not leave any items on the stairs. ?Make sure that there are handrails on both sides of the stairs and use them. Fix handrails that  are broken or loose. Make sure that handrails are as long as the stairways. ?Check any carpeting to make sure that it is firmly attached to the stairs. Fix any carpet that is loose or worn. ?Avoid having throw rugs at the top or bottom of the stairs. If you do have throw rugs, attach them to the floor with carpet tape. ?Make sure that you have a light switch at the top of the stairs and the bottom of the stairs. If you do not have them, ask someone to add them for you. ?What else can I do to help prevent falls? ?Wear shoes that: ?Do not have high heels. ?Have rubber bottoms. ?Are comfortable and fit you well. ?Are closed at the toe. Do not wear sandals. ?If you use a stepladder: ?Make sure that it is fully opened. Do not climb a closed stepladder. ?Make sure that both sides of the stepladder are locked into place. ?Ask someone to hold it for you, if possible. ?Clearly mark and make sure that you can  see: ?Any grab bars or handrails. ?First and last steps. ?Where the edge of each step is. ?Use tools that help you move around (mobility aids) if they are needed. These include: ?Canes. ?Walkers. ?Scooters. ?Crutc

## 2021-05-18 NOTE — Telephone Encounter (Signed)
Contacted patient on preferred number listed in notes for scheduled AWV. Patient refused to complete encounter. Patient stated she filled out questionnaire and  will not proceed. ?

## 2021-05-24 ENCOUNTER — Encounter: Payer: Self-pay | Admitting: Adult Health

## 2021-05-24 ENCOUNTER — Ambulatory Visit (INDEPENDENT_AMBULATORY_CARE_PROVIDER_SITE_OTHER): Payer: Medicare Other | Admitting: Adult Health

## 2021-05-24 VITALS — BP 124/80 | HR 88 | Temp 97.6°F | Ht 64.0 in | Wt 119.0 lb

## 2021-05-24 DIAGNOSIS — F909 Attention-deficit hyperactivity disorder, unspecified type: Secondary | ICD-10-CM | POA: Diagnosis not present

## 2021-05-24 MED ORDER — AMPHETAMINE-DEXTROAMPHETAMINE 15 MG PO TABS: 15.0000 mg | ORAL_TABLET | Freq: Two times a day (BID) | ORAL | 0 refills | Status: DC

## 2021-05-24 NOTE — Patient Instructions (Signed)
There are no preventive care reminders to display for this patient. ? ? ? Row Labels 05/18/2021  ?  1:20 PM 10/04/2020  ? 10:14 AM  ?Depression screen PHQ 2/9   Section Header. No data exists in this row.    ?Decreased Interest   0 0  ?Down, Depressed, Hopeless   0 0  ?PHQ - 2 Score   0 0  ? ? ?

## 2021-05-24 NOTE — Progress Notes (Signed)
? ?Subjective:  ? ? Patient ID: Melissa Cervantes, female    DOB: [birth year], 81 y.o.   MRN: [id number] ? ?HPI ?81 year old female who  has a past medical history of Achalasia, ADHD, Allergy, Cervical dystonia, Chicken pox, Esophageal obstruction, Hearing loss, Meniere disease, Osteoarthritis, and Spinal stenosis. ? ?She presents to the office today for medication management related to ADHD. She is currently prescribed Adderall 15 mg BID. She has been on this dose for many years. Continues to feel well controlled without any side effects ? ? ?Review of Systems ?See HPI  ? ?Past Medical History:  ?Diagnosis Date  ? Achalasia   ? ADHD   ? Allergy   ? Cervical dystonia   ? Chicken pox   ? Esophageal obstruction   ? per patient she is unable to eat solid food  ? Hearing loss   ? Meniere disease   ? Osteoarthritis   ? Spinal stenosis   ? ? ?Social History  ? ?Socioeconomic History  ? Marital status: Divorced  ?  Spouse name: Not on file  ? Number of children: Not on file  ? Years of education: Not on file  ? Highest education level: Master's degree (e.g., MA, MS, MEng, MEd, MSW, MBA)  ?Occupational History  ? Not on file  ?Tobacco Use  ? Smoking status: Former  ? Smokeless tobacco: Never  ?Vaping Use  ? Vaping Use: Never used  ?Substance and Sexual Activity  ? Alcohol use: Yes  ?  Comment: 4  ? Drug use: Not Currently  ?  Types: Marijuana  ? Sexual activity: Not Currently  ?Other Topics Concern  ? Not on file  ?Social History Narrative  ? Not on file  ? ?Social Determinants of Health  ? ?Financial Resource Strain: Low Risk   ? Difficulty of Paying Living Expenses: Not very hard  ?Food Insecurity: Unknown  ? Worried About Programme researcher, broadcasting/film/video in the Last Year: Patient refused  ? Ran Out of Food in the Last Year: Patient refused  ?Transportation Needs: No Transportation Needs  ? Lack of Transportation (Medical): No  ? Lack of Transportation (Non-Medical): No  ?Physical Activity: Sufficiently Active  ? Days of Exercise per Week:  5 days  ? Minutes of Exercise per Session: 90 min  ?Stress: Stress Concern Present  ? Feeling of Stress : To some extent  ?Social Connections: Moderately Isolated  ? Frequency of Communication with Friends and Family: More than three times a week  ? Frequency of Social Gatherings with Friends and Family: Three times a week  ? Attends Religious Services: Never  ? Active Member of Clubs or Organizations: Yes  ? Attends Banker Meetings: More than 4 times per year  ? Marital Status: Divorced  ?Intimate Partner Violence: Not At Risk  ? Fear of Current or Ex-Partner: No  ? Emotionally Abused: No  ? Physically Abused: No  ? Sexually Abused: No  ? ? ?Past Surgical History:  ?Procedure Laterality Date  ? BALLOON DILATION N/A 01/30/2021  ? Procedure: BALLOON DILATION;  Surgeon: Napoleon Form, MD;  Location: [facility];  Service: Endoscopy;  Laterality: N/A;  ? CESAREAN SECTION  1971, 1979  ? CYST EXCISION    ? tailbone  ? ESOPHAGEAL BRUSHING  01/30/2021  ? Procedure: ESOPHAGEAL BRUSHING;  Surgeon: Napoleon Form, MD;  Location: [facility];  Service: Endoscopy;;  ? ESOPHAGOGASTRODUODENOSCOPY (EGD) WITH PROPOFOL N/A 01/30/2021  ? Procedure: ESOPHAGOGASTRODUODENOSCOPY (EGD) WITH PROPOFOL;  Surgeon: Marsa Aris  V, MD;  Location: [facility];  Service: Endoscopy;  Laterality: N/A;  ? HELLER MYOTOMY  08/2005  ? NISSEN FUNDOPLICATION  2007  ? TOTAL HIP ARTHROPLASTY Left 06/2012  ? ? ?Family History  ?Problem Relation Age of Onset  ? Heart disease Mother   ? Heart disease Father   ? Alcohol abuse Father   ? Dystonia Sister   ? Dystonia Brother   ? Colon cancer Neg Hx   ? Esophageal cancer Neg Hx   ? Stomach cancer Neg Hx   ? ? ?Allergies  ?Allergen Reactions  ? Other   ?  Seasonal allergies, horses, cats and mold  ? ? ?Current Outpatient Medications on File Prior to Visit  ?Medication Sig Dispense Refill  ? albuterol (VENTOLIN HFA) 108 (90 Base) MCG/ACT inhaler INHALE 1-2 PUFFS BY MOUTH EVERY 6  HOURS AS NEEDED FOR WHEEZE OR SHORTNESS OF BREATH 8.5 each 3  ? amphetamine-dextroamphetamine (ADDERALL) 15 MG tablet Take 1 tablet by mouth 2 (two) times daily. 60 tablet 0  ? cetirizine (ZYRTEC) 10 MG chewable tablet Chew 10 mg by mouth as needed for allergies.    ? clonazePAM (KLONOPIN) 0.5 MG tablet Take 0.5 mg by mouth at bedtime.    ? ?Current Facility-Administered Medications on File Prior to Visit  ?Medication Dose Route Frequency Provider Last Rate Last Admin  ? 0.9 %  sodium chloride infusion   Intravenous PRN Janetta Hora, PA-C      ? ? ?BP 124/80   Pulse 88   Temp 97.6 ?F (36.4 ?C) (Oral)   Ht 5\' 4"  (1.626 m)   Wt 119 lb (54 kg)   SpO2 97%   BMI 20.43 kg/m?  ? ? ?   ?Objective:  ? Physical Exam ?Vitals and nursing note reviewed.  ?Constitutional:   ?   Appearance: Normal appearance.  ?Musculoskeletal:     ?   General: Normal range of motion.  ?Skin: ?   General: Skin is warm and dry.  ?   Capillary Refill: Capillary refill takes less than 2 seconds.  ?Neurological:  ?   General: No focal deficit present.  ?   Mental Status: She is alert and oriented to person, place, and time.  ?Psychiatric:     ?   Mood and Affect: Mood normal.     ?   Behavior: Behavior normal.     ?   Thought Content: Thought content normal.     ?   Judgment: Judgment normal.  ? ? ?   ?Assessment & Plan:  ?1. Attention deficit hyperactivity disorder (ADHD), unspecified ADHD type ?- Continue with adderall 15 mg BID  ?- amphetamine-dextroamphetamine (ADDERALL) 15 MG tablet; Take 1 tablet by mouth 2 (two) times daily.  Dispense: 60 tablet; Refill: 0 ?- amphetamine-dextroamphetamine (ADDERALL) 15 MG tablet; Take 1 tablet by mouth 2 (two) times daily.  Dispense: 60 tablet; Refill: 0 ?- Follow up in 6 months or sooner if needed ? ? , NP ? ? ?

## 2021-05-31 ENCOUNTER — Encounter: Payer: Self-pay | Admitting: Internal Medicine

## 2021-05-31 ENCOUNTER — Ambulatory Visit (INDEPENDENT_AMBULATORY_CARE_PROVIDER_SITE_OTHER): Payer: Medicare Other | Admitting: Internal Medicine

## 2021-05-31 VITALS — BP 116/68 | HR 70 | Temp 97.5°F | Ht 64.0 in | Wt 117.0 lb

## 2021-05-31 DIAGNOSIS — R0602 Shortness of breath: Secondary | ICD-10-CM

## 2021-05-31 DIAGNOSIS — J4530 Mild persistent asthma, uncomplicated: Secondary | ICD-10-CM

## 2021-05-31 DIAGNOSIS — J302 Other seasonal allergic rhinitis: Secondary | ICD-10-CM | POA: Diagnosis not present

## 2021-05-31 LAB — PULMONARY FUNCTION TEST
DL/VA % pred: 136 %
DL/VA: 5.56 ml/min/mmHg/L
DLCO cor % pred: 103 %
DLCO cor: 19.48 ml/min/mmHg
DLCO unc % pred: 103 %
DLCO unc: 19.48 ml/min/mmHg
FEF 25-75 Post: 1.25 L/sec
FEF 25-75 Pre: 0.87 L/sec
FEF2575-%Change-Post: 42 %
FEF2575-%Pred-Post: 85 %
FEF2575-%Pred-Pre: 59 %
FEV1-%Change-Post: 10 %
FEV1-%Pred-Post: 71 %
FEV1-%Pred-Pre: 64 %
FEV1-Post: 1.41 L
FEV1-Pre: 1.28 L
FEV1FVC-%Change-Post: 6 %
FEV1FVC-%Pred-Pre: 95 %
FEV6-%Change-Post: 5 %
FEV6-%Pred-Post: 74 %
FEV6-%Pred-Pre: 70 %
FEV6-Post: 1.87 L
FEV6-Pre: 1.77 L
FEV6FVC-%Pred-Post: 105 %
FEV6FVC-%Pred-Pre: 105 %
FVC-%Change-Post: 4 %
FVC-%Pred-Post: 70 %
FVC-%Pred-Pre: 68 %
FVC-Post: 1.88 L
FVC-Pre: 1.81 L
Post FEV1/FVC ratio: 75 %
Post FEV6/FVC ratio: 100 %
Pre FEV1/FVC ratio: 71 %
Pre FEV6/FVC Ratio: 100 %
RV % pred: 89 %
RV: 2.12 L
TLC % pred: 81 %
TLC: 4.12 L

## 2021-05-31 NOTE — Progress Notes (Signed)
? ?      ?Melissa Cervantes    [id number]    Apr 22, 1940 ? ?Primary Care Physician:Nafziger, Kandee Keen, NP ?Date of Appointment: 05/31/2021 ?Established Patient Visit ? ?Chief complaint:   ?Chief Complaint  ?Patient presents with  ? Follow-up  ?  PFT follow up, SOB improved   ? ? ? ?HPI: ?Melissa Cervantes is a 81 y.o. woman who presents with shortness of breath. ? ?Interval Updates: ?Here for follow up after PFTs which show moderate airflow limitation with +BD response.  ?She feels better with albuterol.  ? ?Has had to use albuterol twice in the last 10 days. ? ?Also feels symptoms of allergic rhinitis coming on  - takes cetirizine as needed.   ? ?No limitations with ADLs. Overall doing much better.  ? ?She sometimes confuses her achalasia symptoms with asthma but does feel like stress is another trigger for symptoms.  ? ?I have reviewed the patient's family social and past medical history and updated as appropriate.  ? ?Past Medical History:  ?Diagnosis Date  ? Achalasia   ? ADHD   ? Allergy   ? Cervical dystonia   ? Chicken pox   ? Esophageal obstruction   ? per patient she is unable to eat solid food  ? Hearing loss   ? Meniere disease   ? Osteoarthritis   ? Spinal stenosis   ? ? ?Past Surgical History:  ?Procedure Laterality Date  ? BALLOON DILATION N/A 01/30/2021  ? Procedure: BALLOON DILATION;  Surgeon: Napoleon Form, MD;  Location: [facility];  Service: Endoscopy;  Laterality: N/A;  ? CESAREAN SECTION  1971, 1979  ? CYST EXCISION    ? tailbone  ? ESOPHAGEAL BRUSHING  01/30/2021  ? Procedure: ESOPHAGEAL BRUSHING;  Surgeon: Napoleon Form, MD;  Location: [facility];  Service: Endoscopy;;  ? ESOPHAGOGASTRODUODENOSCOPY (EGD) WITH PROPOFOL N/A 01/30/2021  ? Procedure: ESOPHAGOGASTRODUODENOSCOPY (EGD) WITH PROPOFOL;  Surgeon: Napoleon Form, MD;  Location: [facility];  Service: Endoscopy;  Laterality: N/A;  ? HELLER MYOTOMY  08/2005  ? NISSEN FUNDOPLICATION  2007  ? TOTAL HIP ARTHROPLASTY Left  06/2012  ? ? ?Family History  ?Problem Relation Age of Onset  ? Heart disease Mother   ? Heart disease Father   ? Alcohol abuse Father   ? Dystonia Sister   ? Dystonia Brother   ? Colon cancer Neg Hx   ? Esophageal cancer Neg Hx   ? Stomach cancer Neg Hx   ? ? ?Social History  ? ?Occupational History  ? Not on file  ?Tobacco Use  ? Smoking status: Former  ? Smokeless tobacco: Never  ?Vaping Use  ? Vaping Use: Never used  ?Substance and Sexual Activity  ? Alcohol use: Yes  ?  Comment: 4  ? Drug use: Not Currently  ?  Types: Marijuana  ? Sexual activity: Not Currently  ? ? ? ?Physical Exam: ?Blood pressure 116/68, pulse 70, temperature (!) 97.5 ?F (36.4 ?C), temperature source Oral, height 5\' 4"  (1.626 m), weight 117 lb (53.1 kg), SpO2 94 %. ? ?Gen:      No acute distress ?ENT:  no nasal polyps, mucus membranes moist ?Lungs:    No increased respiratory effort, symmetric chest wall excursion, clear to auscultation bilaterally, no wheezes or crackles ?CV:         Regular rate and rhythm; no murmurs, rubs, or gallops.  No pedal edema ? ? ?Data Reviewed: ?Imaging: ?I have personally reviewed the  ? ?PFTs: ? ? ?  Latest Ref Rng & Units 05/31/2021  ? 12:00 PM  ?PFT Results  ?FVC-Pre L 1.81  P  ?FVC-Predicted Pre % 68  P  ?FVC-Post L 1.88  P  ?FVC-Predicted Post % 70  P  ?Pre FEV1/FVC % % 71  P  ?Post FEV1/FCV % % 75  P  ?FEV1-Pre L 1.28  P  ?FEV1-Predicted Pre % 64  P  ?FEV1-Post L 1.41  P  ?DLCO uncorrected ml/min/mmHg 19.48  P  ?DLCO UNC% % 103  P  ?DLCO corrected ml/min/mmHg 19.48  P  ?DLCO COR %Predicted % 103  P  ?DLVA Predicted % 136  P  ?TLC L 4.12  P  ?TLC % Predicted % 81  P  ?RV % Predicted % 89  P  ?  ?P Preliminary result  ? ?I have personally reviewed the patient's PFTs and moderate airflow limitation with +bd response.  ? ?Labs: ? ?Immunization status: ?Immunization History  ?Administered Date(s) Administered  ? Influenza,inj,quad, With Preservative 10/06/2019  ? Influenza-Unspecified 10/06/2019, 11/23/2020  ?  Moderna Sars-Covid-2 Vaccination 03/08/2019, 04/07/2019, 12/03/2019  ? Pneumococcal Conjugate-13 10/06/2019  ? Pneumococcal Polysaccharide-23 10/06/2018, 10/06/2019  ? Tdap 01/02/2021  ? Zoster Recombinat (Shingrix) 10/06/2018, 10/06/2019  ? ? ?External Records Personally Reviewed: PCP ? ?Assessment:  ?Mild persistent asthma ?Allergic rhinitis ? ?Plan/Recommendations: ?Controlled on prn albuterol. Continue this ?Continue prn cetirizine for allergies.  ?She will let me know if symptoms worsen and she might need a maintenance inhaler.  ? ? ?Return to Care: ?Return in about 6 months (around 11/30/2021). ? ? ?Durel Salts, MD ?Pulmonary and Critical Care Medicine ?[facility] ?Office:[phone] ? ? ? ? ? ?

## 2021-05-31 NOTE — Patient Instructions (Signed)
Please schedule follow up scheduled with myself in 6 months.  If my schedule is not open yet, we will contact you with a reminder closer to that time. Please call [phone] if you haven't heard from [place] a month before.  ? ?Continue albuterol rescue inhaler as needed.  ?Let me know if you are using it more than 2-3 times/week. You might need a maintenance inhaler.  ?Continue cetirizine as needed for allergies. Allergies not well controlled can worsen asthma.  ? ?

## 2021-05-31 NOTE — Patient Instructions (Signed)
Performed full PFT today. ?

## 2021-05-31 NOTE — Progress Notes (Signed)
Performed full PFT today. ?

## 2021-07-05 ENCOUNTER — Ambulatory Visit (INDEPENDENT_AMBULATORY_CARE_PROVIDER_SITE_OTHER): Payer: Medicare Other | Admitting: Internal Medicine

## 2021-07-05 ENCOUNTER — Encounter: Payer: Self-pay | Admitting: Internal Medicine

## 2021-07-05 VITALS — BP 116/68 | HR 82 | Temp 98.4°F | Ht 64.0 in | Wt 119.6 lb

## 2021-07-05 DIAGNOSIS — J309 Allergic rhinitis, unspecified: Secondary | ICD-10-CM

## 2021-07-05 DIAGNOSIS — J4530 Mild persistent asthma, uncomplicated: Secondary | ICD-10-CM

## 2021-07-05 MED ORDER — FLUTICASONE PROPIONATE HFA 110 MCG/ACT IN AERO: 1.0000 | INHALATION_SPRAY | Freq: Two times a day (BID) | RESPIRATORY_TRACT | 5 refills | Status: DC

## 2021-07-05 NOTE — Patient Instructions (Addendum)
Please schedule follow up scheduled with myself in 6 months.  If my schedule is not open yet, we will contact you with a reminder closer to that time. Please call [phone] if you haven't heard from [place] a month before.   Start taking flovent 1 puff in the morning, 1 puff at night, gargle after use.   Let me know if you want to start singulair for allergies and sinus congestion and I will send it to your pharmacy.

## 2021-07-05 NOTE — Progress Notes (Signed)
Melissa Cervantes    [id number]    01-12-1941  Primary Care Physician:Nafziger, Kandee Keen, NP Date of Appointment: 07/05/2021 Established Patient Visit  Chief complaint:   Chief Complaint  Patient presents with   Follow-up    She is having to use her Albuterol more than 1 time a day.      HPI: Melissa Cervantes is a 81 y.o. woman with achalasia and mild persistent asthma.  Interval Updates: Previously started on prn albuterol. Here for follow up before previously scheduled.  Having worsening wheezing and coughing relieved with albuterol  Here because she is using her albuterol more frequently 1-2 times/day.  Also having worsening allergy symptoms and is taking cetirizine daily - she feels this is keeping symptoms controlled.   Current Regimen: prn albuterol Asthma Triggers: stress Exacerbations in the last year: none requiring prednisone History of hospitalization or intubation: never Allergy Testing: none GERD: yes, but also has achalasia Allergic Rhinitis: denies ACT:  Asthma Control Test ACT Total Score  07/05/2021  1:39 PM 13   FeNO:  I have reviewed the patient's family social and past medical history and updated as appropriate.   Past Medical History:  Diagnosis Date   Achalasia    ADHD    Allergy    Cervical dystonia    Chicken pox    Esophageal obstruction    per patient she is unable to eat solid food   Hearing loss    Meniere disease    Osteoarthritis    Spinal stenosis     Past Surgical History:  Procedure Laterality Date   BALLOON DILATION N/A 01/30/2021   Procedure: BALLOON DILATION;  Surgeon: Napoleon Form, MD;  Location: [facility];  Service: Endoscopy;  Laterality: N/A;   CESAREAN SECTION  1971, 1979   CYST EXCISION     tailbone   ESOPHAGEAL BRUSHING  01/30/2021   Procedure: ESOPHAGEAL BRUSHING;  Surgeon: Napoleon Form, MD;  Location: [facility];  Service: Endoscopy;;   ESOPHAGOGASTRODUODENOSCOPY (EGD) WITH PROPOFOL N/A  01/30/2021   Procedure: ESOPHAGOGASTRODUODENOSCOPY (EGD) WITH PROPOFOL;  Surgeon: Napoleon Form, MD;  Location: [facility];  Service: Endoscopy;  Laterality: N/A;   HELLER MYOTOMY  08/2005   NISSEN FUNDOPLICATION  2007   TOTAL HIP ARTHROPLASTY Left 06/2012    Family History  Problem Relation Age of Onset   Heart disease Mother    Heart disease Father    Alcohol abuse Father    Dystonia Sister    Dystonia Brother    Colon cancer Neg Hx    Esophageal cancer Neg Hx    Stomach cancer Neg Hx     Social History   Occupational History   Not on file  Tobacco Use   Smoking status: Former   Smokeless tobacco: Never  Building services engineer Use: Never used  Substance and Sexual Activity   Alcohol use: Yes    Comment: 4   Drug use: Not Currently    Types: Marijuana   Sexual activity: Not Currently     Physical Exam: Blood pressure 116/68, pulse 82, temperature 98.4 F (36.9 C), temperature source Oral, height 5\' 4"  (1.626 m), weight 119 lb 9.6 oz (54.3 kg), SpO2 94 %.  Gen:      No acute distress, hoarse voice, frequent throat clearing ENT:  no nasal polyps, mucus membranes moist Lungs:    No increased respiratory effort, symmetric chest wall excursion, clear to auscultation bilaterally, no wheezes or crackles  CV:         Regular rate and rhythm; no murmurs, rubs, or gallops.  No pedal edema   Data Reviewed: Imaging: I have personally reviewed the chest xray July 2022  - no acute process  PFTs:     Latest Ref Rng & Units 05/31/2021   12:00 PM  PFT Results  FVC-Pre L 1.81    FVC-Predicted Pre % 68    FVC-Post L 1.88    FVC-Predicted Post % 70    Pre FEV1/FVC % % 71    Post FEV1/FCV % % 75    FEV1-Pre L 1.28    FEV1-Predicted Pre % 64    FEV1-Post L 1.41    DLCO uncorrected ml/min/mmHg 19.48    DLCO UNC% % 103    DLCO corrected ml/min/mmHg 19.48    DLCO COR %Predicted % 103    DLVA Predicted % 136    TLC L 4.12    TLC % Predicted % 81    RV % Predicted %  89     I have personally reviewed the patient's PFTs and moderate airflow limitation with +bd response.   Labs:  Immunization status: Immunization History  Administered Date(s) Administered   Influenza,inj,quad, With Preservative 10/06/2019   Influenza-Unspecified 10/06/2019, 11/23/2020   Moderna Sars-Covid-2 Vaccination 03/08/2019, 04/07/2019, 12/03/2019   Pneumococcal Conjugate-13 10/06/2019   Pneumococcal Polysaccharide-23 10/06/2018, 10/06/2019   Tdap 01/02/2021   Zoster Recombinat (Shingrix) 10/06/2018, 10/06/2019    External Records Personally Reviewed: PCP  Assessment:  Mild persistent asthma, worsening control Allergic rhinitis, controlled for now  Plan/Recommendations: Start flovent 1 puff BID. Gargle after use. Continue prn cetirizine for allergies.  We discussed montelukast for allergies - she will call back if she feels she needs this and I will call it into her pharmacy - does not need appt.   Return to Care: Return in about 6 months (around 01/04/2022).   Durel Salts, MD Pulmonary and Critical Care Medicine [facility] Office:[phone]

## 2021-07-17 ENCOUNTER — Encounter: Payer: Self-pay | Admitting: Internal Medicine

## 2021-07-18 NOTE — Telephone Encounter (Signed)
Pharmacy please do BIV for ICS and ICS-LABA

## 2021-07-18 NOTE — Telephone Encounter (Signed)
Dr Celine Mans, please see email and advise recommendations regarding flovent inhaler, thanks!  Melissa Cervantes  P [facility] (supporting Charlott Holler, MD) 22 hours ago (4:08 PM)    I have some problems with this prescription, starting with the cost of $210 with insurance!   My shortness of breath definitely  disappeared once I started using this, but  the roof of my mouth is very sore and irritated by every food and drink. It feels lumpy or rashy too, beyond irritating and actually painful.  Yes, I gargle and rinse my mouth thoroughly after use and keep the device clean. I have also been rather sluggish or unenergetic though of course that may be coincidence or a side effect of my very sore mouth.   I quit the inhaler for 3 doses "just to see".  My mouth seemed  improved in that it did not get worse, but the shortness of breath came back.(  btw I developed thrush while using Advair discus in the past. ). You did mention  Montelukast/Singular pills so I am wondering if that or something else you know about could be a better and cheaper alternative without these side effects. Thank you for your help. Melissa Cervantes

## 2021-07-23 ENCOUNTER — Telehealth: Payer: Self-pay | Admitting: Adult Health

## 2021-07-23 NOTE — Telephone Encounter (Signed)
Pt is calling and would like  a refill on amphetamine-dextroamphetamine (ADDERALL) 15 MG tablet   [id number] [place], [place] - [address] Phone:  [phone]  Fax:  [phone]

## 2021-07-26 ENCOUNTER — Other Ambulatory Visit (HOSPITAL_COMMUNITY): Payer: Self-pay

## 2021-07-26 MED ORDER — MONTELUKAST SODIUM 10 MG PO TABS: 10.0000 mg | ORAL_TABLET | Freq: Every day | ORAL | 11 refills | Status: DC

## 2021-07-26 NOTE — Telephone Encounter (Signed)
Lmom for the pt to check with her pharm and that she should have another refill

## 2021-08-21 ENCOUNTER — Telehealth: Payer: Self-pay | Admitting: Adult Health

## 2021-08-21 DIAGNOSIS — F909 Attention-deficit hyperactivity disorder, unspecified type: Secondary | ICD-10-CM

## 2021-08-21 MED ORDER — AMPHETAMINE-DEXTROAMPHETAMINE 15 MG PO TABS: 15.0000 mg | ORAL_TABLET | Freq: Two times a day (BID) | ORAL | 0 refills | Status: DC

## 2021-08-21 NOTE — Telephone Encounter (Signed)
Pt rquesting a refill amphetamine-dextroamphetamine (ADDERALL) 15 MG tablet

## 2021-08-21 NOTE — Telephone Encounter (Signed)
Ok to fill?  Last ADHD appt 05/2021   Last refill 05/2021 for 3 months

## 2021-08-22 ENCOUNTER — Ambulatory Visit (INDEPENDENT_AMBULATORY_CARE_PROVIDER_SITE_OTHER): Payer: Medicare Other | Admitting: Adult Health

## 2021-08-22 ENCOUNTER — Encounter: Payer: Self-pay | Admitting: Adult Health

## 2021-08-22 VITALS — BP 160/90 | HR 93 | Temp 98.3°F | Ht 64.0 in | Wt 120.0 lb

## 2021-08-22 DIAGNOSIS — H6121 Impacted cerumen, right ear: Secondary | ICD-10-CM | POA: Diagnosis not present

## 2021-08-22 DIAGNOSIS — G243 Spasmodic torticollis: Secondary | ICD-10-CM

## 2021-08-22 NOTE — Progress Notes (Signed)
Subjective:    Patient ID: Melissa Cervantes, female    DOB: [birth year], 81 y.o.   MRN: [id number]  HPI 81 year old female who  has a past medical history of Achalasia, ADHD, Allergy, Cervical dystonia, Chicken pox, Esophageal obstruction, Hearing loss, Meniere disease, Osteoarthritis, and Spinal stenosis.  She presents to the office today for concern of cerumen impaction in her right ear. She feels as though her ear is full and has loss of hearing. She wears a hearing aid in her right ear  She also has a history of cervical dystonia, is seen at [facility] neurology but would like to be referred to [facility] neurology for this issue.  Review of Systems See HPI   Past Medical History:  Diagnosis Date   Achalasia    ADHD    Allergy    Cervical dystonia    Chicken pox    Esophageal obstruction    per patient she is unable to eat solid food   Hearing loss    Meniere disease    Osteoarthritis    Spinal stenosis     Social History   Socioeconomic History   Marital status: Divorced    Spouse name: Not on file   Number of children: Not on file   Years of education: Not on file   Highest education level: Master's degree (e.g., MA, MS, MEng, MEd, MSW, MBA)  Occupational History   Not on file  Tobacco Use   Smoking status: Former   Smokeless tobacco: Never  Vaping Use   Vaping Use: Never used  Substance and Sexual Activity   Alcohol use: Yes    Comment: 4   Drug use: Not Currently    Types: Marijuana   Sexual activity: Not Currently  Other Topics Concern   Not on file  Social History Narrative   Not on file   Social Determinants of Health   Financial Resource Strain: Low Risk  (05/23/2021)   Overall Financial Resource Strain (CARDIA)    Difficulty of Paying Living Expenses: Not very hard  Food Insecurity: Unknown (05/23/2021)   Hunger Vital Sign    Worried About Running Out of Food in the Last Year: Patient refused    Ran Out of Food in the Last Year: Patient refused   Transportation Needs: No Transportation Needs (05/23/2021)   PRAPARE - Administrator, Civil Service (Medical): No    Lack of Transportation (Non-Medical): No  Physical Activity: Sufficiently Active (05/23/2021)   Exercise Vital Sign    Days of Exercise per Week: 5 days    Minutes of Exercise per Session: 90 min  Stress: Stress Concern Present (05/23/2021)   [employer] of Occupational Health - Occupational Stress Questionnaire    Feeling of Stress : To some extent  Social Connections: Moderately Isolated (05/23/2021)   Social Connection and Isolation Panel [NHANES]    Frequency of Communication with Friends and Family: More than three times a week    Frequency of Social Gatherings with Friends and Family: Three times a week    Attends Religious Services: Never    Active Member of Clubs or Organizations: Yes    Attends Banker Meetings: More than 4 times per year    Marital Status: Divorced  Intimate Partner Violence: Not At Risk (05/18/2021)   Humiliation, Afraid, Rape, and Kick questionnaire    Fear of Current or Ex-Partner: No    Emotionally Abused: No    Physically Abused: No  Sexually Abused: No    Past Surgical History:  Procedure Laterality Date   BALLOON DILATION N/A 01/30/2021   Procedure: BALLOON DILATION;  Surgeon: Napoleon Form, MD;  Location: [facility];  Service: Endoscopy;  Laterality: N/A;   CESAREAN SECTION  1971, 1979   CYST EXCISION     tailbone   ESOPHAGEAL BRUSHING  01/30/2021   Procedure: ESOPHAGEAL BRUSHING;  Surgeon: Napoleon Form, MD;  [facility]: [facility];  Service: Endoscopy;;   ESOPHAGOGASTRODUODENOSCOPY (EGD) WITH PROPOFOL N/A 01/30/2021   Procedure: ESOPHAGOGASTRODUODENOSCOPY (EGD) WITH PROPOFOL;  Surgeon: Napoleon Form, MD;  Location: [facility];  Service: Endoscopy;  Laterality: N/A;   HELLER MYOTOMY  08/2005   NISSEN FUNDOPLICATION  2007   TOTAL HIP ARTHROPLASTY Left 06/2012    Family  History  Problem Relation Age of Onset   Heart disease Mother    Heart disease Father    Alcohol abuse Father    Dystonia Sister    Dystonia Brother    Colon cancer Neg Hx    Esophageal cancer Neg Hx    Stomach cancer Neg Hx     Allergies  Allergen Reactions   Other     Seasonal allergies, horses, cats and mold    Current Outpatient Medications on File Prior to Visit  Medication Sig Dispense Refill   albuterol (VENTOLIN HFA) 108 (90 Base) MCG/ACT inhaler INHALE 1-2 PUFFS BY MOUTH EVERY 6 HOURS AS NEEDED FOR WHEEZE OR SHORTNESS OF BREATH 8.5 each 3   amphetamine-dextroamphetamine (ADDERALL) 15 MG tablet Take 1 tablet by mouth 2 (two) times daily. 60 tablet 0   amphetamine-dextroamphetamine (ADDERALL) 15 MG tablet Take 1 tablet by mouth 2 (two) times daily. 60 tablet 0   amphetamine-dextroamphetamine (ADDERALL) 15 MG tablet Take 1 tablet by mouth 2 (two) times daily. 60 tablet 0   cetirizine (ZYRTEC) 10 MG chewable tablet Chew 10 mg by mouth as needed for allergies.     clonazePAM (KLONOPIN) 0.5 MG tablet Take 0.5 mg by mouth at bedtime.     montelukast (SINGULAIR) 10 MG tablet Take 1 tablet (10 mg total) by mouth at bedtime. 30 tablet 11   Current Facility-Administered Medications on File Prior to Visit  Medication Dose Route Frequency Provider Last Rate Last Admin   0.9 %  sodium chloride infusion   Intravenous PRN Cline Crock R, PA-C        BP (!) 160/90   Pulse 93   Temp 98.3 F (36.8 C) (Oral)   Ht 5\' 4"  (1.626 m)   Wt 120 lb (54.4 kg)   SpO2 95%   BMI 20.60 kg/m       Objective:   Physical Exam Vitals and nursing note reviewed.  Constitutional:      Appearance: Normal appearance.  HENT:     Right Ear: There is impacted cerumen.     Left Ear: There is no impacted cerumen.  Skin:    General: Skin is warm and dry.  Neurological:     General: No focal deficit present.     Mental Status: She is alert and oriented to person, place, and time.  Psychiatric:         Mood and Affect: Mood normal.        Behavior: Behavior normal.        Thought Content: Thought content normal.        Judgment: Judgment normal.           Assessment & Plan:   1. Impacted  cerumen of right ear Verbal consent obtained. Warm water was applied and gentle ear lavage performed to right ear canal. There were no complications and following the disimpaction the tympanic membrane was visible .  Tympanic membranes was intact following the procedure.  Auditory canals are normal.  The patient reported relief of symptoms after removal of cerumen. Patient tolerated procedure well.    2. Cervical dystonia  - Ambulatory referral to Neurology    Shirline Frees, NP

## 2021-08-23 ENCOUNTER — Encounter: Payer: Self-pay | Admitting: Adult Health

## 2021-09-10 ENCOUNTER — Telehealth: Payer: Self-pay | Admitting: Internal Medicine

## 2021-09-12 MED ORDER — ARNUITY ELLIPTA 100 MCG/ACT IN AEPB: 1.0000 | INHALATION_SPRAY | Freq: Every day | RESPIRATORY_TRACT | 5 refills | Status: DC

## 2021-09-12 NOTE — Telephone Encounter (Signed)
Patient Instructions by Charlott Holler, MD at 07/05/2021 1:30 PM  Author: Charlott Holler, MD Author Type: Physician Filed: 07/05/2021  1:55 PM  Note Status: Addendum Melissa Cervantes: Cosign Not Required Encounter Date: 07/05/2021  Editor: Charlott Holler, MD (Physician)      Prior Versions:     Please schedule follow up scheduled with myself in 6 months.  If my schedule is not open yet, we will contact you with a reminder closer to that time. Please call [phone] if you haven't heard from [place] a month before.    Start taking flovent 1 puff in the morning, 1 puff at night, gargle after use.    Let me know if you want to start singulair for allergies and sinus congestion and I will send it to your pharmacy.       Per [employer], pt could not get Flovent due to the cost.  Due to pt requesting to have Arnuity sent to the pharmacy, routing this to Dr. Celine Mans for which dose.

## 2021-09-12 NOTE — Telephone Encounter (Signed)
Attempted to call pt but unable to reach. Left pt a detailed message for pt letting her know that the Rx for arnuity had been sent to preferred pharmacy. Nothing further needed.

## 2021-09-21 ENCOUNTER — Encounter: Payer: Self-pay | Admitting: Adult Health

## 2021-09-21 ENCOUNTER — Ambulatory Visit (INDEPENDENT_AMBULATORY_CARE_PROVIDER_SITE_OTHER): Payer: Medicare Other | Admitting: Adult Health

## 2021-09-21 VITALS — BP 120/86 | HR 88 | Temp 97.9°F | Ht 64.0 in | Wt 118.0 lb

## 2021-09-21 DIAGNOSIS — J302 Other seasonal allergic rhinitis: Secondary | ICD-10-CM | POA: Diagnosis not present

## 2021-09-21 DIAGNOSIS — J4520 Mild intermittent asthma, uncomplicated: Secondary | ICD-10-CM | POA: Diagnosis not present

## 2021-09-21 NOTE — Progress Notes (Signed)
Subjective:    Patient ID: Melissa Cervantes, female    DOB: 04/13/1940, 81 y.o.   MRN: [id number]  HPI  81 year old female who  has a past medical history of Achalasia, ADHD, Allergy, Cervical dystonia, Chicken pox, Esophageal obstruction, Hearing loss, Meniere disease, Osteoarthritis, and Spinal stenosis.  She presents to the office today to discuss medication management.  She does have a history of asthma and seasonal allergies, seen by pulmonary and recently switched her maintenance inhaler to Arnuity Ellipta 100 mcg in the morning as well as continuing her albuterol inhaler.  She reports that the Arnuity Ellipta is helping slightly but she continues to have to use her albuterol inhaler on a daily basis.  She is also prescribed Singulair but has not been taking this on a daily basis, and had no idea that her pulmonologist sent in a refill of the prescription.   About once a week she will have constant congestion and sneezing.  She does have Zyrtec on her medication list, reports that she took dose earlier this week and felt woozy and felt as though she "swelled up".  This is since resolved.  She is leaving in a month to go to [place] on a hiking trip and wants to make sure she is feeling her best  Review of Systems See HPI   Past Medical History:  Diagnosis Date   Achalasia    ADHD    Allergy    Cervical dystonia    Chicken pox    Esophageal obstruction    per patient she is unable to eat solid food   Hearing loss    Meniere disease    Osteoarthritis    Spinal stenosis     Social History   Socioeconomic History   Marital status: Divorced    Spouse name: Not on file   Number of children: Not on file   Years of education: Not on file   Highest education level: Master's degree (e.g., MA, MS, MEng, MEd, MSW, MBA)  Occupational History   Not on file  Tobacco Use   Smoking status: Former   Smokeless tobacco: Never  Vaping Use   Vaping Use: Never used  Substance and Sexual  Activity   Alcohol use: Yes    Comment: 4   Drug use: Not Currently    Types: Marijuana   Sexual activity: Not Currently  Other Topics Concern   Not on file  Social History Narrative   Not on file   Social Determinants of Health   Financial Resource Strain: Low Risk  (05/23/2021)   Overall Financial Resource Strain (CARDIA)    Difficulty of Paying Living Expenses: Not very hard  Food Insecurity: Unknown (05/23/2021)   Hunger Vital Sign    Worried About Running Out of Food in the Last Year: Patient refused    Ran Out of Food in the Last Year: Patient refused  Transportation Needs: No Transportation Needs (05/23/2021)   PRAPARE - Administrator, Civil Service (Medical): No    Lack of Transportation (Non-Medical): No  Physical Activity: Sufficiently Active (05/23/2021)   Exercise Vital Sign    Days of Exercise per Week: 5 days    Minutes of Exercise per Session: 90 min  Stress: Stress Concern Present (05/23/2021)   [employer] of Occupational Health - Occupational Stress Questionnaire    Feeling of Stress : To some extent  Social Connections: Moderately Isolated (05/23/2021)   Social Connection and Isolation Panel [NHANES]  Frequency of Communication with Friends and Family: More than three times a week    Frequency of Social Gatherings with Friends and Family: Three times a week    Attends Religious Services: Never    Active Member of Clubs or Organizations: Yes    Attends Banker Meetings: More than 4 times per year    Marital Status: Divorced  Intimate Partner Violence: Not At Risk (05/18/2021)   Humiliation, Afraid, Rape, and Kick questionnaire    Fear of Current or Ex-Partner: No    Emotionally Abused: No    Physically Abused: No    Sexually Abused: No    Past Surgical History:  Procedure Laterality Date   BALLOON DILATION N/A 01/30/2021   Procedure: BALLOON DILATION;  Surgeon: Napoleon Form, MD;  Location: [facility];  Service:  Endoscopy;  Laterality: N/A;   CESAREAN SECTION  1971, 1979   CYST EXCISION     tailbone   ESOPHAGEAL BRUSHING  01/30/2021   Procedure: ESOPHAGEAL BRUSHING;  Surgeon: Napoleon Form, MD;  [facility]: [facility];  Service: Endoscopy;;   ESOPHAGOGASTRODUODENOSCOPY (EGD) WITH PROPOFOL N/A 01/30/2021   Procedure: ESOPHAGOGASTRODUODENOSCOPY (EGD) WITH PROPOFOL;  Surgeon: Napoleon Form, MD;  Location: [facility];  Service: Endoscopy;  Laterality: N/A;   HELLER MYOTOMY  08/2005   NISSEN FUNDOPLICATION  2007   TOTAL HIP ARTHROPLASTY Left 06/2012    Family History  Problem Relation Age of Onset   Heart disease Mother    Heart disease Father    Alcohol abuse Father    Dystonia Sister    Dystonia Brother    Colon cancer Neg Hx    Esophageal cancer Neg Hx    Stomach cancer Neg Hx     Allergies  Allergen Reactions   Other     Seasonal allergies, horses, cats and mold    Current Outpatient Medications on File Prior to Visit  Medication Sig Dispense Refill   albuterol (VENTOLIN HFA) 108 (90 Base) MCG/ACT inhaler INHALE 1-2 PUFFS BY MOUTH EVERY 6 HOURS AS NEEDED FOR WHEEZE OR SHORTNESS OF BREATH 8.5 each 3   amphetamine-dextroamphetamine (ADDERALL) 15 MG tablet Take 1 tablet by mouth 2 (two) times daily. 60 tablet 0   amphetamine-dextroamphetamine (ADDERALL) 15 MG tablet Take 1 tablet by mouth 2 (two) times daily. 60 tablet 0   amphetamine-dextroamphetamine (ADDERALL) 15 MG tablet Take 1 tablet by mouth 2 (two) times daily. 60 tablet 0   cetirizine (ZYRTEC) 10 MG chewable tablet Chew 10 mg by mouth as needed for allergies.     clonazePAM (KLONOPIN) 0.5 MG tablet Take 0.5 mg by mouth at bedtime.     fluticasone (FLONASE) 50 MCG/ACT nasal spray      Fluticasone Furoate (ARNUITY ELLIPTA) 100 MCG/ACT AEPB Inhale 1 puff into the lungs daily. 30 each 5   montelukast (SINGULAIR) 10 MG tablet Take 1 tablet (10 mg total) by mouth at bedtime. 30 tablet 11   No current  facility-administered medications on file prior to visit.    BP 120/86   Pulse 88   Temp 97.9 F (36.6 C) (Oral)   Ht 5\' 4"  (1.626 m)   Wt 118 lb (53.5 kg)   SpO2 98%   BMI 20.25 kg/m       Objective:   Physical Exam Vitals and nursing note reviewed.  Constitutional:      Appearance: Normal appearance.  Cardiovascular:     Rate and Rhythm: Normal rate and regular rhythm.     Pulses:  Normal pulses.     Heart sounds: Normal heart sounds.  Pulmonary:     Effort: Pulmonary effort is normal.     Breath sounds: Wheezing (trace throughout) present.  Skin:    General: Skin is warm.     Capillary Refill: Capillary refill takes less than 2 seconds.  Neurological:     General: No focal deficit present.     Mental Status: She is alert and oriented to person, place, and time.  Psychiatric:        Mood and Affect: Mood normal.        Behavior: Behavior normal.        Thought Content: Thought content normal.        Judgment: Judgment normal.       Assessment & Plan:  1. Mild intermittent asthma without complication -Continue with maintenance inhaler and start using Singulair daily, hopefully this will decrease her use of albuterol inhaler  2. Seasonal allergies - Start Singulair daily and use flonase daily  - Use antihistamines such as Claritin PRN   - Follow up as needed  Shirline Frees, NP

## 2021-09-26 ENCOUNTER — Telehealth: Payer: Self-pay | Admitting: Adult Health

## 2021-09-26 NOTE — Telephone Encounter (Signed)
[place] is going out of country  [place] 2 months and needs amphetamine-dextroamphetamine (ADDERALL) 15 MG tablet # 120  [id number] [place], [place] - [address] Phone:  [phone]  Fax:  [phone]

## 2021-09-27 MED ORDER — AMPHETAMINE-DEXTROAMPHETAMINE 15 MG PO TABS: 15.0000 mg | ORAL_TABLET | Freq: Two times a day (BID) | ORAL | 0 refills | Status: DC

## 2021-09-27 NOTE — Telephone Encounter (Signed)
Looks like it is already one for this month pleases advise

## 2021-09-27 NOTE — Telephone Encounter (Signed)
Left message to return phone call.

## 2021-09-27 NOTE — Telephone Encounter (Signed)
Patient notified of update  and verbalized understanding. 

## 2021-09-27 NOTE — Telephone Encounter (Signed)
Please advise 

## 2021-10-02 ENCOUNTER — Other Ambulatory Visit: Payer: Self-pay | Admitting: Adult Health

## 2021-10-02 ENCOUNTER — Telehealth: Payer: Self-pay | Admitting: Internal Medicine

## 2021-10-02 DIAGNOSIS — J4520 Mild intermittent asthma, uncomplicated: Secondary | ICD-10-CM

## 2021-10-02 NOTE — Telephone Encounter (Signed)
Patient is calling because she will be leaving the country from september 1 - october 1. She wants to get a refill for her Arnuity Ellipta before she leaves so she could have it while she's gone. Her pharmacy had told her it's too early for a refill. SHe wants to know if Dr. Celine Mans could do something so they will fill the RX

## 2021-10-04 NOTE — Telephone Encounter (Signed)
Attempted to call pt but unable to reach. Left message to call back.

## 2021-10-12 ENCOUNTER — Encounter: Payer: Self-pay | Admitting: Adult Health

## 2021-10-12 NOTE — Progress Notes (Unsigned)
    [facility] [address] [place] [address] Phone: [phone]   Assessment and Plan:     There are no diagnoses linked to this encounter.  ***   Pertinent previous records reviewed include ***   Follow Up: ***     Subjective:   I, Melissa Cervantes, am serving as a Neurosurgeon for Melissa Cervantes   Chief Complaint: right knee pain    HPI:  03/26/21 Patient is a 81 year old female complaining of right knee pain . Patient states that her right knee is now hurting, has been in pain for about 2 weeks does not remember falling she was walking with her friend and noted "oh my knee felt like it shifted to th"e side but she was able to keep walking, no radiating pain , pt states that it is swollen but the swelling is going down has been taking ib and icing and notes that , that has helped some, no numbness or tingling, sometimes she cant walk up the stairs and when she wakes in the morning she cant walk and doesn't feel like her knee will be able to support her   10/15/2021 Patient states    Relevant Historical Information: Osteoarthritis, pseudogout  Additional pertinent review of systems negative.   Current Outpatient Medications:    albuterol (VENTOLIN HFA) 108 (90 Base) MCG/ACT inhaler, INHALE 1-2 PUFFS BY MOUTH EVERY 6 HOURS AS NEEDED FOR WHEEZE OR SHORTNESS OF BREATH, Disp: 8.5 each, Rfl: 3   amphetamine-dextroamphetamine (ADDERALL) 15 MG tablet, Take 1 tablet by mouth 2 (two) times daily., Disp: 60 tablet, Rfl: 0   amphetamine-dextroamphetamine (ADDERALL) 15 MG tablet, Take 1 tablet by mouth 2 (two) times daily., Disp: 60 tablet, Rfl: 0   amphetamine-dextroamphetamine (ADDERALL) 15 MG tablet, Take 1 tablet by mouth 2 (two) times daily., Disp: 60 tablet, Rfl: 0   amphetamine-dextroamphetamine (ADDERALL) 15 MG tablet, Take 1 tablet by mouth 2 (two) times daily., Disp: 120 tablet, Rfl: 0   cetirizine (ZYRTEC) 10 MG  chewable tablet, Chew 10 mg by mouth as needed for allergies., Disp: , Rfl:    clonazePAM (KLONOPIN) 0.5 MG tablet, Take 0.5 mg by mouth at bedtime., Disp: , Rfl:    fluticasone (FLONASE) 50 MCG/ACT nasal spray, , Disp: , Rfl:    Fluticasone Furoate (ARNUITY ELLIPTA) 100 MCG/ACT AEPB, Inhale 1 puff into the lungs daily., Disp: 30 each, Rfl: 5   montelukast (SINGULAIR) 10 MG tablet, Take 1 tablet (10 mg total) by mouth at bedtime., Disp: 30 tablet, Rfl: 11   Objective:     There were no vitals filed for this visit.    There is no height or weight on file to calculate BMI.    Physical Exam:    ***   Electronically signed by:  Melissa Cervantes [facility] 9:43 AM 10/12/21

## 2021-10-12 NOTE — Telephone Encounter (Signed)
Please advise 

## 2021-10-15 ENCOUNTER — Ambulatory Visit (INDEPENDENT_AMBULATORY_CARE_PROVIDER_SITE_OTHER): Payer: Medicare Other | Admitting: Sports Medicine

## 2021-10-15 VITALS — BP 122/82 | Ht 64.0 in | Wt 118.0 lb

## 2021-10-15 DIAGNOSIS — B070 Plantar wart: Secondary | ICD-10-CM

## 2021-10-15 DIAGNOSIS — M170 Bilateral primary osteoarthritis of knee: Secondary | ICD-10-CM

## 2021-10-15 DIAGNOSIS — M1711 Unilateral primary osteoarthritis, right knee: Secondary | ICD-10-CM

## 2021-10-15 DIAGNOSIS — M1712 Unilateral primary osteoarthritis, left knee: Secondary | ICD-10-CM

## 2021-10-15 NOTE — Patient Instructions (Addendum)
Good to see you  Wart removal kit  As needed follow up

## 2021-10-29 ENCOUNTER — Telehealth: Payer: Self-pay | Admitting: Adult Health

## 2021-10-29 NOTE — Telephone Encounter (Signed)
LVM on daughter phone that pt would need to be seen where she is as [place] Rumps is unable to help her if [place] is [place].

## 2021-10-29 NOTE — Telephone Encounter (Signed)
Jamon (Daughter) called to say Pt is in [place] and tested positive for Covid.   Daughter would like advise on what to do next. [phone]

## 2021-11-22 ENCOUNTER — Ambulatory Visit (INDEPENDENT_AMBULATORY_CARE_PROVIDER_SITE_OTHER): Payer: Medicare Other | Admitting: Sports Medicine

## 2021-11-22 VITALS — BP 130/80 | Ht 64.0 in | Wt 120.0 lb

## 2021-11-22 DIAGNOSIS — B070 Plantar wart: Secondary | ICD-10-CM | POA: Diagnosis not present

## 2021-11-22 DIAGNOSIS — M1711 Unilateral primary osteoarthritis, right knee: Secondary | ICD-10-CM

## 2021-11-22 DIAGNOSIS — M1712 Unilateral primary osteoarthritis, left knee: Secondary | ICD-10-CM

## 2021-11-22 NOTE — Patient Instructions (Addendum)
Good to see you Podiatry referral  As needed follow up

## 2021-11-22 NOTE — Progress Notes (Signed)
    Melissa Cervantes D.[place] Phone: [phone]   Assessment and Plan:     1. Plantar wart -Chronic, subsequent visit - Consistent with plantar warts of right second toe - Over-the-counter treatments have been ineffective - We will refer to podiatry to discuss excision versus freezing therapies  2. Osteoarthritis of right knee, unspecified osteoarthritis type 3. Osteoarthritis of left knee, unspecified osteoarthritis type  -Chronic with exacerbation, subsequent visit - Significant improvement in bilateral knee pain after bilateral intra-articular knee CSI performed on 10/15/2021 - Goal of minimum 3 months relief from CSI, so could repeat after 01/14/2022 or later.  If patient does not receive 3 months relief, we could consider alternative injections such as hyaluronic acid versus Zilretta injections versus PRP injections - Continue Tylenol as needed for day-to-day pain relief  Pertinent previous records reviewed include none   Follow Up: As needed   Subjective:   I, Pincus Badder, am serving as a Education administrator for Doctor Glennon Mac  Chief Complaint: plantar wart follow up   HPI:   11/22/21 Patient is a 81 year old female complaining of plantar wart. Patient states she is scared , and it hurts some its enlarging and its TTP   Relevant Historical Information: None pertinent  Additional pertinent review of systems negative.   Current Outpatient Medications:    albuterol (VENTOLIN HFA) 108 (90 Base) MCG/ACT inhaler, INHALE 1-2 PUFFS BY MOUTH EVERY 6 HOURS AS NEEDED FOR WHEEZE OR SHORTNESS OF BREATH, Disp: 8.5 each, Rfl: 3   amphetamine-dextroamphetamine (ADDERALL) 15 MG tablet, Take 1 tablet by mouth 2 (two) times daily., Disp: 60 tablet, Rfl: 0   amphetamine-dextroamphetamine (ADDERALL) 15 MG tablet, Take 1 tablet by mouth 2 (two) times daily., Disp: 60 tablet, Rfl: 0   amphetamine-dextroamphetamine  (ADDERALL) 15 MG tablet, Take 1 tablet by mouth 2 (two) times daily., Disp: 60 tablet, Rfl: 0   amphetamine-dextroamphetamine (ADDERALL) 15 MG tablet, Take 1 tablet by mouth 2 (two) times daily., Disp: 120 tablet, Rfl: 0   cetirizine (ZYRTEC) 10 MG chewable tablet, Chew 10 mg by mouth as needed for allergies., Disp: , Rfl:    clonazePAM (KLONOPIN) 0.5 MG tablet, Take 0.5 mg by mouth at bedtime., Disp: , Rfl:    fluticasone (FLONASE) 50 MCG/ACT nasal spray, , Disp: , Rfl:    Fluticasone Furoate (ARNUITY ELLIPTA) 100 MCG/ACT AEPB, Inhale 1 puff into the lungs daily., Disp: 30 each, Rfl: 5   montelukast (SINGULAIR) 10 MG tablet, Take 1 tablet (10 mg total) by mouth at bedtime., Disp: 30 tablet, Rfl: 11   Objective:     Vitals:   11/22/21 1034  BP: 130/80  Weight: 120 lb (54.4 kg)  Height: 5\' 4"  (1.626 m)      Body mass index is 20.6 kg/m.    Physical Exam:    General: Well-appearing, cooperative, sitting comfortably in no acute distress.   Skin: Warm and dry; no focal rashes identified on limited exam.  Plantar wart over second toe between first and second digits with superficial skin degeneration with patient using over-the-counter wart remover.   Electronically signed by:  Melissa Cervantes D.[facility] 10:49 AM 11/22/21

## 2021-11-26 ENCOUNTER — Encounter: Payer: Self-pay | Admitting: Adult Health

## 2021-11-26 DIAGNOSIS — Z5320 Procedure and treatment not carried out because of patient's decision for unspecified reasons: Secondary | ICD-10-CM

## 2021-11-27 NOTE — Telephone Encounter (Signed)
Ok for referral?

## 2021-12-03 ENCOUNTER — Telehealth: Payer: Self-pay | Admitting: Adult Health

## 2021-12-03 NOTE — Telephone Encounter (Signed)
Refill amphetamine-dextroamphetamine (ADDERALL) 15 MG tablet   [id number] [place], [place] Phone:  [phone]  Fax:  [phone]

## 2021-12-04 ENCOUNTER — Ambulatory Visit (INDEPENDENT_AMBULATORY_CARE_PROVIDER_SITE_OTHER): Payer: Medicare Other | Admitting: Podiatry

## 2021-12-04 DIAGNOSIS — M2042 Other hammer toe(s) (acquired), left foot: Secondary | ICD-10-CM | POA: Diagnosis not present

## 2021-12-04 DIAGNOSIS — L84 Corns and callosities: Secondary | ICD-10-CM | POA: Diagnosis not present

## 2021-12-04 NOTE — Patient Instructions (Signed)
I do not think this is a wart. It is a corn caused by your hammertoe and bunion deformity   More silicone toe caps  can be purchased from:  [email or web address]

## 2021-12-04 NOTE — Progress Notes (Signed)
  Subjective:  Patient ID: Melissa Cervantes, female    DOB: 12-26-1940,  MRN: [id number]  Chief Complaint  Patient presents with   Plantar Warts    (np) right foot, 2nd toe plantars wart-between toes    81 y.o. female presents with the above complaint. History confirmed with patient.  She was told this is a plantars wart and to use wart remover pads to get rid of this  Objective:  Physical Exam: warm, good capillary refill, no trophic changes or ulcerative lesions, normal DP and PT pulses, normal sensory exam, and on the right foot she has hallux valgus deformity with hammertoe contracture as well and a hyperkeratotic corn on the medial PIPJ of the second toe facing the hallux  Assessment:   1. Corn of toe   2. Hammertoe of left foot      Plan:  Patient was evaluated and treated and all questions answered.  I discussed with her that this is in fact not a wart but rather a hyperkeratotic corn secondary to her digital deformities of hallux valgus and the hammertoe.  We discussed nonsurgical treatment of this with offloading with silicone pads.  Silicone toe caps were dispensed.  She will follow-up as needed for this if it does not improve  Return if symptoms worsen or fail to improve.

## 2021-12-06 MED ORDER — AMPHETAMINE-DEXTROAMPHETAMINE 15 MG PO TABS: 15.0000 mg | ORAL_TABLET | Freq: Two times a day (BID) | ORAL | 0 refills | Status: DC

## 2021-12-06 NOTE — Telephone Encounter (Signed)
Patient informed that rx was sent and to scheduled OV

## 2021-12-06 NOTE — Addendum Note (Signed)
Addended by: Apolinar Junes on: 12/06/2021 06:58 AM   Modules accepted: Orders

## 2022-01-30 ENCOUNTER — Telehealth: Payer: Self-pay | Admitting: Adult Health

## 2022-01-30 NOTE — Telephone Encounter (Signed)
Pt called to ask for a refill of the   amphetamine-dextroamphetamine (ADDERALL) 15 MG tablet   LOV:  09/21/21  [id number] [name], [name] - [address] Phone: [phone]  Fax: [phone]

## 2022-01-30 NOTE — Telephone Encounter (Signed)
Pt has been scheduled for a 6 month f/u.

## 2022-02-05 ENCOUNTER — Other Ambulatory Visit: Payer: Self-pay | Admitting: Adult Health

## 2022-02-05 MED ORDER — AMPHETAMINE-DEXTROAMPHETAMINE 15 MG PO TABS: 15.0000 mg | ORAL_TABLET | Freq: Two times a day (BID) | ORAL | 0 refills | Status: DC

## 2022-02-05 NOTE — Telephone Encounter (Signed)
Noted  

## 2022-02-05 NOTE — Telephone Encounter (Signed)
Please advise 

## 2022-02-05 NOTE — Telephone Encounter (Signed)
Pt has on pill left and does have an appt on 02-08-2022

## 2022-02-08 ENCOUNTER — Encounter: Payer: Self-pay | Admitting: Adult Health

## 2022-02-08 ENCOUNTER — Ambulatory Visit (INDEPENDENT_AMBULATORY_CARE_PROVIDER_SITE_OTHER): Payer: Medicare Other | Admitting: Adult Health

## 2022-02-08 VITALS — BP 120/80 | Temp 97.5°F | Ht 64.0 in | Wt 119.0 lb

## 2022-02-08 DIAGNOSIS — G243 Spasmodic torticollis: Secondary | ICD-10-CM | POA: Diagnosis not present

## 2022-02-08 DIAGNOSIS — F909 Attention-deficit hyperactivity disorder, unspecified type: Secondary | ICD-10-CM | POA: Diagnosis not present

## 2022-02-08 DIAGNOSIS — R2681 Unsteadiness on feet: Secondary | ICD-10-CM

## 2022-02-08 DIAGNOSIS — H8102 Meniere's disease, left ear: Secondary | ICD-10-CM

## 2022-02-08 DIAGNOSIS — R21 Rash and other nonspecific skin eruption: Secondary | ICD-10-CM

## 2022-02-08 DIAGNOSIS — E782 Mixed hyperlipidemia: Secondary | ICD-10-CM

## 2022-02-08 DIAGNOSIS — K220 Achalasia of cardia: Secondary | ICD-10-CM | POA: Diagnosis not present

## 2022-02-08 DIAGNOSIS — J4520 Mild intermittent asthma, uncomplicated: Secondary | ICD-10-CM

## 2022-02-08 MED ORDER — AMPHETAMINE-DEXTROAMPHETAMINE 15 MG PO TABS: 15.0000 mg | ORAL_TABLET | Freq: Two times a day (BID) | ORAL | 0 refills | Status: DC

## 2022-02-08 MED ORDER — TRIAMCINOLONE ACETONIDE 0.1 % EX CREA: 1.0000 | TOPICAL_CREAM | Freq: Two times a day (BID) | CUTANEOUS | 0 refills | Status: DC

## 2022-02-08 NOTE — Progress Notes (Signed)
Subjective:    Patient ID: Melissa Cervantes, female    DOB: [birth year], 82 y.o.   MRN: [id number]  HPI Patient presents for yearly follow up exam. She is a pleasant 82 year old female who  has a past medical history of Achalasia, ADHD, Allergy, Cervical dystonia, Chicken pox, Esophageal obstruction, Hearing loss, Meniere disease, Osteoarthritis, and Spinal stenosis.  ADHD - managed with Adderall 15 mg BID. She does fell well controlled on this medication   Hyperlipidemia - not currently on medication.  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 She has hearing aids bilaterally and uses a noice machine   Cervical Dystonia - She is managed by [facility] and receives botox injections ever 3-5 months. She is also prescribed klonopin 0.5 mg QHS as an antispasmodic. She does not exercises for balance and range of motion daily.   Achalasia - has had multiple esophageal dilations as well as laparoscopic Heller's myotomy with full fundoplication. She is seen by GI on a routine basis   Asthma - mild and intermittent, she is seen by pulmonary routinely. Uses Albuterol inhaler PRN  All immunizations and health maintenance protocols were reviewed with the patient and needed orders were placed.  Gait Instability - she feels unstable with walking believes it is from the cervical dystonia. She has not had any falls is interested in working with PT so that they can help with gait.   Rash - this has been present on and off for multiple years, in the past she was given a prescription for " some type of cream with cortisone in it" Her rash is itchy and irritating. a  Appropriate screening laboratory values were ordered for the patient including screening of hyperlipidemia,  renal function and hepatic function.  Medication reconciliation,  past medical history, social history, problem list and allergies were reviewed in detail with the patient  Goals were established with regard to weight loss, exercise, and  diet in compliance with medications. She continues to enjoy walking and hiking and tries to do this is much as possible.    Review of Systems  Constitutional: Negative.   HENT:  Positive for hearing loss, tinnitus and trouble swallowing.   Eyes: Negative.   Respiratory: Negative.    Cardiovascular: Negative.   Gastrointestinal: Negative.   Endocrine: Negative.   Genitourinary: Negative.   Musculoskeletal:  Positive for arthralgias, back pain, gait problem, neck pain and neck stiffness.  Skin:  Positive for rash.  Allergic/Immunologic: Negative.   Hematological: Negative.   Psychiatric/Behavioral:  Positive for decreased concentration.    Past Medical History:  Diagnosis Date   Achalasia    ADHD    Allergy    Cervical dystonia    Chicken pox    Esophageal obstruction    per patient she is unable to eat solid food   Hearing loss    Meniere disease    Osteoarthritis    Spinal stenosis     Social History   Socioeconomic History   Marital status: Divorced    Spouse name: Not on file   Number of children: Not on file   Years of education: Not on file   Highest education level: Master's degree (e.g., MA, MS, MEng, MEd,  MSW, MBA)  Occupational History   Not on file  Tobacco Use   Smoking status: Former   Smokeless tobacco: Never  Vaping Use   Vaping Use: Never used  Substance and Sexual Activity   Alcohol use: Yes    Comment: 4   Drug use: Not Currently    Types: Marijuana   Sexual activity: Not Currently  Other Topics Concern   Not on file  Social History Narrative   Not on file   Social Determinants of Health   Financial Resource Strain: Low Risk  (05/23/2021)   Overall Financial Resource Strain (CARDIA)    Difficulty of  Paying Living Expenses: Not very hard  Food Insecurity: Unknown (05/23/2021)   Hunger Vital Sign    Worried About Running Out of Food in the Last Year: Patient refused    Ran Out of Food in the Last Year: Patient refused  Transportation Needs: No Transportation Needs (05/23/2021)   PRAPARE - Administrator, Civil Service (Medical): No    Lack of Transportation (Non-Medical): No  Physical Activity: Sufficiently Active (05/23/2021)   Exercise Vital Sign    Days of Exercise per Week: 5 days    Minutes of Exercise per Session: 90 min  Stress: Stress Concern Present (05/23/2021)   [employer] of Occupational Health - Occupational Stress Questionnaire    Feeling of Stress : To some extent  Social Connections: Moderately Isolated (05/23/2021)   Social Connection and Isolation Panel [NHANES]    Frequency of Communication with Friends and Family: More than three times a week    Frequency of Social Gatherings with Friends and Family: Three times a week    Attends Religious Services: Never    Active Member of Clubs or Organizations: Yes    Attends Banker Meetings: More than 4 times per year    Marital Status: Divorced  Intimate Partner Violence: Not At Risk (05/18/2021)   Humiliation, Afraid, Rape, and Kick questionnaire    Fear of Current or Ex-Partner: No    Emotionally Abused: No    Physically Abused: No    Sexually Abused: No    Past Surgical History:  Procedure Laterality Date   BALLOON DILATION N/A 01/30/2021   Procedure: BALLOON DILATION;  Surgeon: Napoleon Form, MD;  Location: [facility];  Service: Endoscopy;  Laterality: N/A;   CESAREAN SECTION  1971, 1979   CYST EXCISION     tailbone   ESOPHAGEAL BRUSHING  01/30/2021   Procedure: ESOPHAGEAL BRUSHING;  Surgeon: Napoleon Form, MD;  Location: [facility];  Service: Endoscopy;;   ESOPHAGOGASTRODUODENOSCOPY (EGD) WITH PROPOFOL N/A 01/30/2021   Procedure: ESOPHAGOGASTRODUODENOSCOPY (EGD) WITH  PROPOFOL;  Surgeon: Napoleon Form, MD;  Location: [facility];  Service: Endoscopy;  Laterality: N/A;   HELLER MYOTOMY  08/2005   NISSEN FUNDOPLICATION  2007   TOTAL HIP ARTHROPLASTY Left 06/2012    Family History  Problem Relation Age of Onset   Heart disease Mother    Heart disease Father    Alcohol abuse Father    Dystonia Sister    Dystonia Brother    Colon cancer Neg Hx    Esophageal cancer Neg Hx    Stomach cancer Neg Hx     Allergies  Allergen Reactions   Levonorgestrel-Ethinyl Estrad    Other     Seasonal allergies, horses, cats and mold    Current Outpatient Medications on File Prior to Visit  Medication Sig Dispense Refill   albuterol (VENTOLIN HFA) 108 (  90 Base) MCG/ACT inhaler INHALE 1-2 PUFFS BY MOUTH EVERY 6 HOURS AS NEEDED FOR WHEEZE OR SHORTNESS OF BREATH 8.5 each 3   amphetamine-dextroamphetamine (ADDERALL) 15 MG tablet Take 1 tablet by mouth 2 (two) times daily. 60 tablet 0   amphetamine-dextroamphetamine (ADDERALL) 15 MG tablet Take 1 tablet by mouth 2 (two) times daily. 60 tablet 0   amphetamine-dextroamphetamine (ADDERALL) 15 MG tablet Take 1 tablet by mouth 2 (two) times daily. 60 tablet 0   cetirizine (ZYRTEC) 10 MG chewable tablet Chew 10 mg by mouth as needed for allergies.     clonazePAM (KLONOPIN) 0.5 MG tablet Take 0.5 mg by mouth at bedtime.     fluticasone (FLONASE) 50 MCG/ACT nasal spray      Fluticasone Furoate (ARNUITY ELLIPTA) 100 MCG/ACT AEPB Inhale 1 puff into the lungs daily. 30 each 5   montelukast (SINGULAIR) 10 MG tablet Take 1 tablet (10 mg total) by mouth at bedtime. 30 tablet 11   amphetamine-dextroamphetamine (ADDERALL) 15 MG tablet Take 1 tablet by mouth 2 (two) times daily. 120 tablet 0   No current facility-administered medications on file prior to visit.    BP 120/80   Temp (!) 97.5 F (36.4 C) (Oral)   Ht 5\' 4"  (1.626 m)   Wt 119 lb (54 kg)   BMI 20.43 kg/m       Objective:   Physical Exam Vitals and nursing  note reviewed.  Constitutional:      General: She is not in acute distress.    Appearance: Normal appearance. She is well-developed. She is not ill-appearing.  HENT:     Head: Normocephalic and atraumatic.     Right Ear: Tympanic membrane, ear canal and external ear normal. There is no impacted cerumen.     Left Ear: Tympanic membrane, ear canal and external ear normal. There is no impacted cerumen.     Nose: Nose normal. No congestion or rhinorrhea.     Mouth/Throat:     Mouth: Mucous membranes are moist.     Pharynx: Oropharynx is clear. No oropharyngeal exudate or posterior oropharyngeal erythema.  Eyes:     General:        Right eye: No discharge.        Left eye: No discharge.     Extraocular Movements: Extraocular movements intact.     Conjunctiva/sclera: Conjunctivae normal.     Pupils: Pupils are equal, round, and reactive to light.  Neck:     Thyroid: No thyromegaly.     Vascular: No carotid bruit.     Trachea: No tracheal deviation.  Cardiovascular:     Rate and Rhythm: Normal rate and regular rhythm.     Pulses: Normal pulses.     Heart sounds: Normal heart sounds. No murmur heard.    No friction rub. No gallop.  Pulmonary:     Effort: Pulmonary effort is normal. No respiratory distress.     Breath sounds: Normal breath sounds. No stridor. No wheezing, rhonchi or rales.  Chest:     Chest wall: No tenderness.  Abdominal:     General: Abdomen is flat. Bowel sounds are normal. There is no distension.     Palpations: Abdomen is soft. There is no mass.     Tenderness: There is no abdominal tenderness. There is no right CVA tenderness, left CVA tenderness, guarding or rebound.     Hernia: No hernia is present.  Musculoskeletal:        General: No swelling, tenderness, deformity or  signs of injury. Normal range of motion.     Cervical back: Normal range of motion and neck supple. Torticollis present.     Right lower leg: No edema.     Left lower leg: No edema.   Lymphadenopathy:     Cervical: No cervical adenopathy.  Skin:    General: Skin is warm and dry.     Coloration: Skin is not jaundiced or pale.     Findings: Rash (flat red rash noted on right shin. Scratch marks present) present. No bruising, erythema or lesion.  Neurological:     General: No focal deficit present.     Mental Status: She is alert and oriented to person, place, and time.     Cranial Nerves: No cranial nerve deficit.     Sensory: No sensory deficit.     Motor: No weakness.     Coordination: Coordination normal.     Gait: Gait abnormal (She needed some minor assistance getting on and off exam table).     Deep Tendon Reflexes: Reflexes normal.  Psychiatric:        Mood and Affect: Mood normal.        Behavior: Behavior normal.        Thought Content: Thought content normal.        Judgment: Judgment normal.       Assessment & Plan:  1. Attention deficit hyperactivity disorder (ADHD), unspecified ADHD type - Controlled.  - Follow up in 85months  - CBC with Differential/Platelet; Future - Comprehensive metabolic panel; Future - Lipid panel; Future - TSH; Future - amphetamine-dextroamphetamine (ADDERALL) 15 MG tablet; Take 1 tablet by mouth 2 (two) times daily.  Dispense: 60 tablet; Refill: 0 - amphetamine-dextroamphetamine (ADDERALL) 15 MG tablet; Take 1 tablet by mouth 2 (two) times daily.  Dispense: 60 tablet; Refill: 0  2. Cervical dystonia - Per Neurology  - CBC with Differential/Platelet; Future - Comprehensive metabolic panel; Future - Lipid panel; Future - TSH; Future  3. Achalasia - Per GI   - CBC with Differential/Platelet; Future - Comprehensive metabolic panel; Future - Lipid panel; Future - TSH; Future  4. Meniere's disease of left ear  - CBC with Differential/Platelet; Future - Comprehensive metabolic panel; Future - Lipid panel; Future - TSH; Future  5. Mixed hyperlipidemia - Refuses statin  - CBC with Differential/Platelet; Future -  Comprehensive metabolic panel; Future - Lipid panel; Future - TSH; Future  6. Mild intermittent asthma without complication - Continue with albuterol inhaler PRN   7. Rash  - triamcinolone cream (KENALOG) 0.1 %; Apply 1 Application topically 2 (two) times daily.  Dispense: 30 g; Refill: 0  8. Gait instability  - Ambulatory referral to Physical Therapy  Shirline Frees, NP

## 2022-02-08 NOTE — Patient Instructions (Signed)
It was great seeing you today   We will follow up with you regarding your lab work - schedule lab work at the front desk   Please let me know if you need anything

## 2022-02-12 ENCOUNTER — Other Ambulatory Visit (INDEPENDENT_AMBULATORY_CARE_PROVIDER_SITE_OTHER): Payer: Medicare Other

## 2022-02-12 DIAGNOSIS — K220 Achalasia of cardia: Secondary | ICD-10-CM

## 2022-02-12 DIAGNOSIS — G243 Spasmodic torticollis: Secondary | ICD-10-CM | POA: Diagnosis not present

## 2022-02-12 DIAGNOSIS — F909 Attention-deficit hyperactivity disorder, unspecified type: Secondary | ICD-10-CM

## 2022-02-12 DIAGNOSIS — H8102 Meniere's disease, left ear: Secondary | ICD-10-CM | POA: Diagnosis not present

## 2022-02-12 DIAGNOSIS — E782 Mixed hyperlipidemia: Secondary | ICD-10-CM

## 2022-02-12 LAB — CBC WITH DIFFERENTIAL/PLATELET
Basophils Absolute: 0.1 10*3/uL (ref 0.0–0.1)
Basophils Relative: 1.4 % (ref 0.0–3.0)
Eosinophils Absolute: 0.9 10*3/uL — ABNORMAL HIGH (ref 0.0–0.7)
Eosinophils Relative: 14.8 % — ABNORMAL HIGH (ref 0.0–5.0)
HCT: 42.9 % (ref 36.0–46.0)
Hemoglobin: 14.2 g/dL (ref 12.0–15.0)
Lymphocytes Relative: 33.7 % (ref 12.0–46.0)
Lymphs Abs: 1.9 10*3/uL (ref 0.7–4.0)
MCHC: 33.1 g/dL (ref 30.0–36.0)
MCV: 95.4 fl (ref 78.0–100.0)
Monocytes Absolute: 0.6 10*3/uL (ref 0.1–1.0)
Monocytes Relative: 10.4 % (ref 3.0–12.0)
Neutro Abs: 2.3 10*3/uL (ref 1.4–7.7)
Neutrophils Relative %: 39.7 % — ABNORMAL LOW (ref 43.0–77.0)
Platelets: 322 10*3/uL (ref 150.0–400.0)
RBC: 4.5 Mil/uL (ref 3.87–5.11)
RDW: 13.1 % (ref 11.5–15.5)
WBC: 5.8 10*3/uL (ref 4.0–10.5)

## 2022-02-12 LAB — LIPID PANEL
Cholesterol: 219 mg/dL — ABNORMAL HIGH (ref 0–200)
HDL: 76.4 mg/dL (ref >39.00)
LDL Cholesterol: 124 mg/dL — ABNORMAL HIGH (ref 0–99)
NonHDL: 142.55
Total CHOL/HDL Ratio: 3
Triglycerides: 94 mg/dL (ref 0.0–149.0)
VLDL: 18.8 mg/dL (ref 0.0–40.0)

## 2022-02-12 LAB — COMPREHENSIVE METABOLIC PANEL
ALT: 13 U/L (ref 0–35)
AST: 24 U/L (ref 0–37)
Albumin: 4.3 g/dL (ref 3.5–5.2)
Alkaline Phosphatase: 77 U/L (ref 39–117)
BUN: 13 mg/dL (ref 6–23)
CO2: 29 mEq/L (ref 19–32)
Calcium: 9.9 mg/dL (ref 8.4–10.5)
Chloride: 104 mEq/L (ref 96–112)
Creatinine, Ser: 0.76 mg/dL (ref 0.40–1.20)
GFR: 73.94 mL/min (ref >60.00)
Glucose, Bld: 83 mg/dL (ref 70–99)
Potassium: 5.2 mEq/L — ABNORMAL HIGH (ref 3.5–5.1)
Sodium: 141 mEq/L (ref 135–145)
Total Bilirubin: 0.9 mg/dL (ref 0.2–1.2)
Total Protein: 6.8 g/dL (ref 6.0–8.3)

## 2022-02-12 LAB — TSH: TSH: 3.3 u[IU]/mL (ref 0.35–5.50)

## 2022-02-13 ENCOUNTER — Other Ambulatory Visit: Payer: Self-pay

## 2022-02-13 MED ORDER — ATORVASTATIN CALCIUM 20 MG PO TABS: 20.0000 mg | ORAL_TABLET | Freq: Every day | ORAL | 1 refills | Status: DC

## 2022-02-20 ENCOUNTER — Other Ambulatory Visit: Payer: Self-pay

## 2022-02-20 ENCOUNTER — Ambulatory Visit: Payer: Medicare Other | Attending: Adult Health

## 2022-02-20 DIAGNOSIS — R2681 Unsteadiness on feet: Secondary | ICD-10-CM | POA: Insufficient documentation

## 2022-02-20 DIAGNOSIS — R293 Abnormal posture: Secondary | ICD-10-CM | POA: Diagnosis present

## 2022-02-20 DIAGNOSIS — M6281 Muscle weakness (generalized): Secondary | ICD-10-CM | POA: Insufficient documentation

## 2022-02-20 DIAGNOSIS — R262 Difficulty in walking, not elsewhere classified: Secondary | ICD-10-CM

## 2022-02-20 NOTE — Therapy (Signed)
OUTPATIENT PHYSICAL THERAPY NEURO EVALUATION   Patient Name: Melissa Cervantes MRN: [id number] DOB:01/06/1941, 82 y.o., female Today's Date: 02/20/2022   PCP: Dorothyann Peng REFERRING PROVIDER: Dorothyann Peng, NP  END OF SESSION:  PT End of Session - 02/20/22 1106     Visit Number 1    Number of Visits 3    Date for PT Re-Evaluation 03/13/22    Authorization Type Medicare    Progress Note Due on Visit 10    PT Start Time 1103    PT Stop Time 1145    PT Time Calculation (min) 42 min             Past Medical History:  Diagnosis Date   Achalasia    ADHD    Allergy    Cervical dystonia    Chicken pox    Esophageal obstruction    per patient she is unable to eat solid food   Hearing loss    Meniere disease    Osteoarthritis    Spinal stenosis    Past Surgical History:  Procedure Laterality Date   BALLOON DILATION N/A 01/30/2021   Procedure: BALLOON DILATION;  Surgeon: Mauri Pole, MD;  Location: [facility];  Service: Endoscopy;  Laterality: N/A;   CESAREAN SECTION  1971, 1979   CYST EXCISION     tailbone   ESOPHAGEAL BRUSHING  01/30/2021   Procedure: ESOPHAGEAL BRUSHING;  Surgeon: Mauri Pole, MD;  Location: [facility];  Service: Endoscopy;;   ESOPHAGOGASTRODUODENOSCOPY (EGD) WITH PROPOFOL N/A 01/30/2021   Procedure: ESOPHAGOGASTRODUODENOSCOPY (EGD) WITH PROPOFOL;  Surgeon: Mauri Pole, MD;  Location: [facility];  Service: Endoscopy;  Laterality: N/A;   HELLER MYOTOMY  03/6376   NISSEN FUNDOPLICATION  [id number]   TOTAL HIP ARTHROPLASTY Left 06/2012   Patient Active Problem List   Diagnosis Date Noted   Dysphagia    Spinal stenosis 06/09/2020   Meniere disease    ADHD    Achalasia    Osteoarthritis    Arthritis of right hip 05/24/2020   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   Degenerative arthritis of right knee 04/21/2020    ONSET DATE: "quite some time"  REFERRING DIAG:  R26.81 (ICD-10-CM) - Gait instability  THERAPY DIAG:  Unsteadiness on feet  Muscle weakness (generalized)  Difficulty in walking, not elsewhere classified  Rationale for Evaluation and Treatment: Rehabilitation  SUBJECTIVE:  SUBJECTIVE STATEMENT: Increasing unsteadiness and instances of tripping. Of note pt has hx of Meniere's disease of left ear and cervical dystonia and receives Botox injections to neck for tx. Pt denies falls but notes increase difficulty with balance on one leg and increased difficulty with her typical exercise regimen and needs UE support when attempting lunge position activities  Pt accompanied by: self  PERTINENT HISTORY: cervical dystonia, Meniere's disease, spinal stenosis, left THR  PAIN:  Are you having pain? No  PRECAUTIONS: Fall  WEIGHT BEARING RESTRICTIONS: No  FALLS: Has patient fallen in last 6 months? No  LIVING ENVIRONMENT: Lives with: lives alone Lives in: House/apartment Stairs: Yes: External: 1 steps; none Has following equipment at home: None  PLOF: Independent  PATIENT GOALS:   OBJECTIVE:   DIAGNOSTIC FINDINGS:   COGNITION: Overall cognitive status: Within functional limits for tasks assessed   SENSATION: WFL  COORDINATION: WNL  EDEMA:    MUSCLE TONE:   MUSCLE LENGTH:   DTRs:    POSTURE: asymmetry with c-spine  LOWER EXTREMITY ROM:     WNL LOWER EXTREMITY MMT:    Grossly 5/5 except left hip abduction 4/5  BED MOBILITY:  indep  TRANSFERS: indepent  RAMP:  independent  CURB:  Level of Assistance: Complete Independence Assistive device utilized: None Curb Comments:   STAIRS: Level of Assistance: Modified independence Stair Negotiation Technique: Alternating Pattern  with No Rails Single Rail on Right Number  of Stairs: 15  Height of Stairs: 4-6''  Comments: able to perform without HR but apprehensive  GAIT: Gait pattern: WFL Distance walked:  Assistive device utilized: None Level of assistance: Complete Independence Comments:   FUNCTIONAL TESTS:  5 times sit to stand: 12 sec Timed up and go (TUG):   Berg Balance Scale: 48/56 Dynamic Gait Index: 21/24  M-CTSIB  Condition 1: Firm Surface, EO 30 Sec, Normal Sway  Condition 2: Firm Surface, EC 30 Sec, Normal and Mild Sway  Condition 3: Foam Surface, EO 30 Sec, Normal Sway  Condition 4: Foam Surface, EC 5 Sec, Severe Sway    PATIENT SURVEYS:  N/a  TODAY'S TREATMENT:                                                                                                                              DATE: 02/20/22    PATIENT EDUCATION: Education details: outcome measure scores, rationale for PT intervention Person educated: Patient Education method: Explanation, Demonstration, and Handouts Education comprehension: verbalized understanding and needs further education  HOME EXERCISE PROGRAM: Access Code: [email or web address] URL: [email or web address] Date: 02/20/2022 Prepared by: Shary Decamp  Exercises - Corner Balance Feet Together With Eyes Open  - 1 x daily - 7 x weekly - 3-5 sets - 30 sec hold - Corner Balance Feet Together With Eyes Closed  - 1 x daily - 7 x weekly - 3-5 sets - 30 sec hold - Corner Balance Feet Together: Eyes Closed With Head Turns  - 1 x  daily - 7 x weekly - 3-5 sets - 3-5 reps - Semi-Tandem Corner Balance With Eyes Open  - 1 x daily - 7 x weekly - 3-5 sets - Semi-Tandem Corner Balance With Eyes Closed  - 1 x daily - 7 x weekly - 3-5 sets - 15-30 sec hold  GOALS: Goals reviewed with patient? Yes  SHORT TERM GOALS: Target date: same as LTG    LONG TERM GOALS: Target date: 03/20/2022    Patient will be independent in HEP to improve functional outcomes Baseline:  Goal status: INITIAL  2.  Patient to  teach back strategies for multi-sensory balance activities to promote self-progression and carryover for home program Baseline:  Goal status: INITIAL  3.  Demo improved safety and tolerance for multi-sensory balance activities per time 15 sec condition 4 M-CTSIB w/ mild sway Baseline: 5 sec severe Goal status: INITIAL  4. Demo improved balance and control per score 52/56 Berg Balance Test to improve safety during ADL  Baseline: 48/56  Goal status: INITIAL  ASSESSMENT:  CLINICAL IMPRESSION: Patient is a 82 y.o. lady who was seen today for physical therapy evaluation and treatment for unsteadiness on feet. Thankfully, demonstrates low risk for falls per several fall risk assessments and ambulates with normal station and no significant deviations noted with various demands and challenges.  Pt endorses healthy lifestyle and meeting physical exercise recommendation standards.  Pt has hx of Meniere's disease and manifests balance impairments typical to vestibular system compromise as evidenced by M-CTSIB.  Pt would benefit from PT sessions to develop and train in proper activities to improve safety and stability with these demands.   OBJECTIVE IMPAIRMENTS: decreased balance.   ACTIVITY LIMITATIONS: stairs and locomotion level on uneven ground  PARTICIPATION LIMITATIONS: community activity and hiking  PERSONAL FACTORS: Time since onset of injury/illness/exacerbation and 3+ comorbidities: see "pertinent hx"  are also affecting patient's functional outcome.   REHAB POTENTIAL: Excellent  CLINICAL DECISION MAKING: Stable/uncomplicated  EVALUATION COMPLEXITY: Low  PLAN:  PT FREQUENCY: 1x/week  PT DURATION: 3 weeks  PLANNED INTERVENTIONS: Therapeutic exercises, Therapeutic activity, Neuromuscular re-education, Balance training, Gait training, Patient/Family education, Self Care, Joint mobilization, Stair training, Vestibular training, Canalith repositioning, DME instructions, Aquatic Therapy,  Dry Needling, Cryotherapy, Moist heat, Taping, and Manual therapy  PLAN FOR NEXT SESSION: HEP review, discuss with how to progress balance/vestibular activities   1:08 PM, 02/20/22 M. Sherlyn Lees, PT, DPT Physical Therapist- [place] Office Number: [phone]

## 2022-03-03 ENCOUNTER — Other Ambulatory Visit: Payer: Self-pay | Admitting: Adult Health

## 2022-03-03 DIAGNOSIS — R21 Rash and other nonspecific skin eruption: Secondary | ICD-10-CM

## 2022-03-04 ENCOUNTER — Encounter: Payer: Self-pay | Admitting: Adult Health

## 2022-03-04 ENCOUNTER — Telehealth: Payer: Self-pay | Admitting: Adult Health

## 2022-03-04 MED ORDER — TRIAMCINOLONE ACETONIDE 0.1 % EX CREA: 1.0000 | TOPICAL_CREAM | Freq: Two times a day (BID) | CUTANEOUS | 0 refills | Status: DC

## 2022-03-04 NOTE — Telephone Encounter (Signed)
Please advise 

## 2022-03-04 NOTE — Telephone Encounter (Signed)
Pt is calling and need a refill on amphetamine-dextroamphetamine (ADDERALL) 15 MG tablet  [id number] [address], [place] - [place] Phone: [phone]  Fax: [phone]

## 2022-03-04 NOTE — Telephone Encounter (Signed)
Pt advised that the Rx was called in and she may have been requesting the refill too early. Pt stated that she was asking for it early so that she can have it. Pt stated she didn't know they will hold it until its time for pick up. Pt stated she will wait when the Rx is ready.

## 2022-03-05 ENCOUNTER — Ambulatory Visit: Payer: Medicare Other

## 2022-03-05 ENCOUNTER — Other Ambulatory Visit: Payer: Self-pay | Admitting: Adult Health

## 2022-03-05 DIAGNOSIS — R262 Difficulty in walking, not elsewhere classified: Secondary | ICD-10-CM

## 2022-03-05 DIAGNOSIS — R2681 Unsteadiness on feet: Secondary | ICD-10-CM

## 2022-03-05 DIAGNOSIS — M6281 Muscle weakness (generalized): Secondary | ICD-10-CM

## 2022-03-05 DIAGNOSIS — R293 Abnormal posture: Secondary | ICD-10-CM

## 2022-03-05 NOTE — Therapy (Signed)
OUTPATIENT PHYSICAL THERAPY NEURO EVALUATION   Patient Name: Melissa Cervantes MRN: [id number] DOB:Apr 07, 1940, 82 y.o., female Today's Date: 03/05/2022   PCP: Dorothyann Peng REFERRING PROVIDER: Dorothyann Peng, NP  END OF SESSION:  PT End of Session - 03/05/22 1451     Visit Number 2    Number of Visits 3    Date for PT Re-Evaluation 03/13/22    Authorization Type Medicare    Progress Note Due on Visit 10    PT Start Time 1450    PT Stop Time 1530    PT Time Calculation (min) 40 min             Past Medical History:  Diagnosis Date   Achalasia    ADHD    Allergy    Cervical dystonia    Chicken pox    Esophageal obstruction    per patient she is unable to eat solid food   Hearing loss    Meniere disease    Osteoarthritis    Spinal stenosis    Past Surgical History:  Procedure Laterality Date   BALLOON DILATION N/A 01/30/2021   Procedure: BALLOON DILATION;  Surgeon: Mauri Pole, MD;  Location: [facility];  Service: Endoscopy;  Laterality: N/A;   CESAREAN SECTION  1971, 1979   CYST EXCISION     tailbone   ESOPHAGEAL BRUSHING  01/30/2021   Procedure: ESOPHAGEAL BRUSHING;  Surgeon: Mauri Pole, MD;  Location: [facility];  Service: Endoscopy;;   ESOPHAGOGASTRODUODENOSCOPY (EGD) WITH PROPOFOL N/A 01/30/2021   Procedure: ESOPHAGOGASTRODUODENOSCOPY (EGD) WITH PROPOFOL;  Surgeon: Mauri Pole, MD;  Location: [facility];  Service: Endoscopy;  Laterality: N/A;   HELLER MYOTOMY  12/9145   NISSEN FUNDOPLICATION  8295   TOTAL HIP ARTHROPLASTY Left 06/2012   Patient Active Problem List   Diagnosis Date Noted   Dysphagia    Spinal stenosis 06/09/2020   Meniere disease    ADHD    Achalasia    Osteoarthritis    Arthritis of right hip 05/24/2020   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 No  PRECAUTIONS: Fall  WEIGHT BEARING RESTRICTIONS: No  FALLS: Has patient fallen in last 6 months? No  LIVING ENVIRONMENT: Lives with: lives alone Lives in: House/apartment Stairs: Yes: External: 1 steps; none Has following equipment at home: None  PLOF: Independent  PATIENT GOALS:   OBJECTIVE:   TODAY'S TREATMENT: 03/05/22 Activity Comments  HEP review Corner balance:  -EO/EC head turns -semi-tandem EO, diff eyes closed  Foot on step 10x 3 sec hold  Hover/lift to improve SLS  Split lunge 1x10 W/ foot on 6" step and trekking poles for balance to improve mobility and   Half-kneeling w/ head turns Knee on foam for padding and to improve proximal stability          PATIENT EDUCATION: Education details: outcome measure scores, rationale for PT intervention Person educated: Patient Education method: Explanation, Demonstration, and Handouts Education comprehension: verbalized understanding and needs further education  HOME EXERCISE PROGRAM: Access Code: [email or web address] URL: [email or web address] Date: 02/20/2022 Prepared by: Sherlyn Lees  Exercises - Corner Balance Feet Together With Eyes Open  - 1 x daily - 7 x weekly - 3-5 sets - 30 sec hold - Corner Balance Feet Together With Eyes Closed  - 1 x daily - 7 x weekly - 3-5 sets - 30 sec hold - Corner  Balance Feet Together: Eyes Closed With Head Turns  - 1 x daily - 7 x weekly - 3-5 sets - 3-5 reps - Semi-Tandem Corner Balance With Eyes Open  - 1 x daily - 7 x weekly - 3-5 sets - Semi-Tandem Corner Balance With Eyes Closed  - 1 x daily - 7 x weekly - 3-5 sets - 15-30 sec hold - Step Taps on Low Step  - 1 x daily - 7 x weekly - 3 sets - 10 reps - 3 sec hold - Split Squat Forward Trunk with Dumbbells  - 1 x daily - 7 x weekly - 3 sets - 10 reps - Half-Kneeling to Standing  - 1 x daily - 7 x weekly - 1-3 sets - 5-10 reps  DIAGNOSTIC FINDINGS:   COGNITION: Overall cognitive status: Within functional limits for tasks assessed   SENSATION: WFL  COORDINATION: WNL  EDEMA:    MUSCLE TONE:   MUSCLE LENGTH:   DTRs:    POSTURE: asymmetry with c-spine  LOWER EXTREMITY ROM:     WNL LOWER EXTREMITY MMT:    Grossly 5/5 except left hip abduction 4/5  BED MOBILITY:  indep  TRANSFERS: indepent  RAMP:  independent  CURB:  Level of Assistance: Complete Independence Assistive device utilized: None Curb Comments:   STAIRS: Level of Assistance: Modified independence Stair Negotiation Technique: Alternating Pattern  with No Rails Single Rail on Right Number of Stairs: 15  Height of Stairs: 4-6''  Comments: able to perform without HR but apprehensive  GAIT: Gait pattern: WFL Distance walked:  Assistive device utilized: None Level of assistance: Complete Independence Comments:   FUNCTIONAL TESTS:  5 times sit to stand: 12 sec Timed up and go (TUG):   Berg Balance Scale: 48/56 Dynamic Gait Index: 21/24  M-CTSIB  Condition 1: Firm Surface, EO 30 Sec, Normal Sway  Condition 2: Firm Surface, EC 30 Sec, Normal and Mild Sway  Condition 3: Foam Surface, EO 30 Sec, Normal Sway  Condition 4: Foam Surface, EC 5 Sec,  Severe Sway    PATIENT SURVEYS:  N/a       GOALS: Goals reviewed with patient? Yes  SHORT TERM GOALS: Target date: same as LTG    LONG TERM  GOALS: Target date: 03/20/2022    Patient will be independent in HEP to improve functional outcomes Baseline:  Goal status: IN PROGRESS  2.  Patient to teach back strategies for multi-sensory balance activities to promote self-progression and carryover for home program Baseline:  Goal status: IN PROGRESS  3.  Demo improved safety and tolerance for multi-sensory balance activities per time 15 sec condition 4 M-CTSIB w/ mild sway Baseline: 5 sec severe Goal status: IN PROGRESS  4. Demo improved balance and control per score 52/56 Berg Balance Test to improve safety during ADL  Baseline: 48/56  Goal status: IN PROGRESS  ASSESSMENT:  CLINICAL IMPRESSION: Returns today for 1st follow-up and demo excellent HEP recall with improved stability performing feet-together and all conditions. LOB/instability with semi-tandem position.  Proceeded with activities to enhance single leg stance per pt request via modified techniques (hover/lift above step). Instructed in techniques to improve stability with lunge position by environmental modifcation and use of half-kneeling and head turns to promote proximal stability and hip strategy.  Tolerated session well without adverse effects   OBJECTIVE IMPAIRMENTS: decreased balance.   ACTIVITY LIMITATIONS: stairs and locomotion level on uneven ground  PARTICIPATION LIMITATIONS: community activity and hiking  PERSONAL FACTORS: Time since onset of injury/illness/exacerbation and 3+ comorbidities: see "pertinent hx"  are also affecting patient's functional outcome.   REHAB POTENTIAL: Excellent  CLINICAL DECISION MAKING: Stable/uncomplicated  EVALUATION COMPLEXITY: Low  PLAN:  PT FREQUENCY: 1x/week  PT DURATION: 3 weeks  PLANNED INTERVENTIONS: Therapeutic exercises, Therapeutic activity, Neuromuscular re-education, Balance training, Gait training, Patient/Family education, Self Care, Joint mobilization, Stair training, Vestibular training, Canalith  repositioning, DME instructions, Aquatic Therapy, Dry Needling, Cryotherapy, Moist heat, Taping, and Manual therapy  PLAN FOR NEXT SESSION: HEP review, discuss with how to progress balance/vestibular activities. D/C if no further issues   2:52 PM, 03/05/22 M. Sherlyn Lees, PT, DPT Physical Therapist- [place] Office Number: [phone]

## 2022-03-08 NOTE — Therapy (Incomplete)
OUTPATIENT PHYSICAL THERAPY NEURO TREATMENT   Patient Name: Melissa Cervantes MRN: [id number] DOB:[birth year], 82 y.o., female Today's Date: 03/05/2022   PCP: Dorothyann Peng REFERRING PROVIDER: Dorothyann Peng, NP  END OF SESSION:  PT End of Session - 03/05/22 1451     Visit Number 2    Number of Visits 3    Date for PT Re-Evaluation 03/13/22    Authorization Type Medicare    Progress Note Due on Visit 10    PT Start Time 1450    PT Stop Time 1530    PT Time Calculation (min) 40 min             Past Medical History:  Diagnosis Date   Achalasia    ADHD    Allergy    Cervical dystonia    Chicken pox    Esophageal obstruction    per patient she is unable to eat solid food   Hearing loss    Meniere disease    Osteoarthritis    Spinal stenosis    Past Surgical History:  Procedure Laterality Date   BALLOON DILATION N/A 01/30/2021   Procedure: BALLOON DILATION;  Surgeon: Mauri Pole, MD;  Location: [facility];  Service: Endoscopy;  Laterality: N/A;   CESAREAN SECTION  1971, 1979   CYST EXCISION     tailbone   ESOPHAGEAL BRUSHING  01/30/2021   Procedure: ESOPHAGEAL BRUSHING;  Surgeon: Mauri Pole, MD;  Location: [facility];  Service: Endoscopy;;   ESOPHAGOGASTRODUODENOSCOPY (EGD) WITH PROPOFOL N/A 01/30/2021   Procedure: ESOPHAGOGASTRODUODENOSCOPY (EGD) WITH PROPOFOL;  Surgeon: Mauri Pole, MD;  Location: [facility];  Service: Endoscopy;  Laterality: N/A;   HELLER MYOTOMY  82/9937   NISSEN FUNDOPLICATION  1696   TOTAL HIP ARTHROPLASTY Left 06/2012   Patient Active Problem List   Diagnosis Date Noted   Dysphagia    Spinal stenosis 06/09/2020   Meniere disease    ADHD    Achalasia    Osteoarthritis    Arthritis of right hip 05/24/2020   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 No  PRECAUTIONS: Fall  WEIGHT BEARING RESTRICTIONS: No  FALLS: Has patient fallen in last 6 months? No  LIVING ENVIRONMENT: Lives with: lives alone Lives in: House/apartment Stairs: Yes: External: 1 steps; none Has following equipment at home: None  PLOF: Independent  PATIENT GOALS:   OBJECTIVE:      TODAY'S TREATMENT: 03/11/22 Activity Comments                       TODAY'S TREATMENT: 03/05/22 Activity Comments  HEP review Corner balance:  -EO/EC head turns -semi-tandem EO, diff eyes closed  Foot on step 10x 3 sec hold  Hover/lift to improve SLS  Split lunge 1x10 W/ foot on 6" step and trekking poles for balance to improve mobility and   Half-kneeling w/ head turns Knee on foam for padding and to improve proximal stability          PATIENT EDUCATION: Education details: outcome measure scores, rationale for PT intervention Person educated: Patient Education method: Explanation, Demonstration, and Handouts Education comprehension: verbalized understanding and needs further education  HOME EXERCISE PROGRAM: Access Code: [email or web address] URL: [email or web address] Date: 02/20/2022 Prepared by: Sherlyn Lees  Exercises - Corner Balance Feet Together With Eyes Open  - 1 x daily - 7 x weekly - 3-5 sets - 30 sec hold - Corner Balance Feet  Together With Eyes Closed  - 1 x daily - 7 x weekly - 3-5 sets - 30 sec hold - Corner Balance Feet Together: Eyes Closed With Head Turns  - 1 x daily - 7 x weekly - 3-5 sets - 3-5 reps - Semi-Tandem Corner Balance With Eyes Open  - 1 x daily - 7 x weekly - 3-5 sets - Semi-Tandem Corner Balance With Eyes Closed  - 1 x daily - 7 x weekly - 3-5 sets - 15-30 sec hold - Step Taps on Low Step  - 1 x daily - 7 x weekly - 3 sets - 10 reps - 3 sec hold - Split Squat Forward Trunk with Dumbbells  - 1 x daily - 7 x weekly - 3 sets - 10 reps - Half-Kneeling to Standing  - 1 x daily - 7 x weekly - 1-3 sets - 5-10 reps   Below measures were taken at time of initial evaluation unless otherwise specified:  DIAGNOSTIC FINDINGS:   COGNITION: Overall cognitive status: Within functional limits for tasks assessed   SENSATION: WFL  COORDINATION: WNL  EDEMA:    MUSCLE TONE:   MUSCLE LENGTH:   DTRs:    POSTURE: asymmetry with c-spine  LOWER EXTREMITY ROM:     WNL LOWER EXTREMITY MMT:    Grossly 5/5 except left hip abduction 4/5  BED MOBILITY:  indep  TRANSFERS: indepent  RAMP:  independent  CURB:  Level of Assistance: Complete Independence Assistive device utilized: None Curb Comments:   STAIRS: Level of Assistance: Modified independence Stair Negotiation Technique: Alternating Pattern  with No Rails Single Rail on Right Number of Stairs: 15  Height of Stairs: 4-6''  Comments: able to perform without HR but apprehensive  GAIT: Gait pattern: WFL Distance walked:  Assistive device utilized: None Level of assistance: Complete Independence Comments:   FUNCTIONAL TESTS:  5 times sit to stand: 12 sec Timed up and go (TUG):   Berg Balance Scale: 48/56 Dynamic Gait  Index: 21/24  M-CTSIB  Condition 1: Firm Surface, EO 30 Sec, Normal Sway  Condition 2: Firm Surface, EC 30 Sec, Normal and Mild Sway  Condition 3: Foam Surface, EO 30 Sec, Normal Sway  Condition 4: Foam  Surface, EC 5 Sec, Severe Sway    PATIENT SURVEYS:  N/a       GOALS: Goals reviewed with patient? Yes  SHORT TERM GOALS: Target date: same as LTG    LONG TERM GOALS: Target date: 03/20/2022    Patient will be independent in HEP to improve functional outcomes Baseline:  Goal status: IN PROGRESS  2.  Patient to teach back strategies for multi-sensory balance activities to promote self-progression and carryover for home program Baseline:  Goal status: IN PROGRESS  3.  Demo improved safety and tolerance for multi-sensory balance activities per time 15 sec condition 4 M-CTSIB w/ mild sway Baseline: 5 sec severe Goal status: IN PROGRESS  4. Demo improved balance and control per score 52/56 Berg Balance Test to improve safety during ADL  Baseline: 48/56  Goal status: IN PROGRESS  ASSESSMENT:  CLINICAL IMPRESSION: Returns today for 1st follow-up and demo excellent HEP recall with improved stability performing feet-together and all conditions. LOB/instability with semi-tandem position.  Proceeded with activities to enhance single leg stance per pt request via modified techniques (hover/lift above step). Instructed in techniques to improve stability with lunge position by environmental modifcation and use of half-kneeling and head turns to promote proximal stability and hip strategy.  Tolerated session well without adverse effects   OBJECTIVE IMPAIRMENTS: decreased balance.   ACTIVITY LIMITATIONS: stairs and locomotion level on uneven ground  PARTICIPATION LIMITATIONS: community activity and hiking  PERSONAL FACTORS: Time since onset of injury/illness/exacerbation and 3+ comorbidities: see "pertinent hx"  are also affecting patient's functional outcome.   REHAB POTENTIAL: Excellent  CLINICAL DECISION MAKING: Stable/uncomplicated  EVALUATION COMPLEXITY: Low  PLAN:  PT FREQUENCY: 1x/week  PT DURATION: 3 weeks  PLANNED INTERVENTIONS: Therapeutic exercises, Therapeutic  activity, Neuromuscular re-education, Balance training, Gait training, Patient/Family education, Self Care, Joint mobilization, Stair training, Vestibular training, Canalith repositioning, DME instructions, Aquatic Therapy, Dry Needling, Cryotherapy, Moist heat, Taping, and Manual therapy  PLAN FOR NEXT SESSION: HEP review, discuss with how to progress balance/vestibular activities. D/C if no further issues

## 2022-03-11 ENCOUNTER — Ambulatory Visit: Payer: Medicare Other | Admitting: Physical Therapy

## 2022-03-14 ENCOUNTER — Encounter: Payer: Self-pay | Admitting: Adult Health

## 2022-03-15 ENCOUNTER — Encounter: Payer: Self-pay | Admitting: Family Medicine

## 2022-03-15 ENCOUNTER — Ambulatory Visit (INDEPENDENT_AMBULATORY_CARE_PROVIDER_SITE_OTHER): Payer: Medicare Other | Admitting: Family Medicine

## 2022-03-15 VITALS — BP 168/92 | HR 88 | Temp 98.2°F | Ht 64.0 in | Wt 121.7 lb

## 2022-03-15 DIAGNOSIS — H00011 Hordeolum externum right upper eyelid: Secondary | ICD-10-CM

## 2022-03-15 MED ORDER — DOXYCYCLINE MONOHYDRATE 25 MG/5ML PO SUSR: 100.0000 mg | Freq: Two times a day (BID) | ORAL | 0 refills | Status: DC

## 2022-03-15 NOTE — Progress Notes (Signed)
   Acute Office Visit  Subjective:     Patient ID: Melissa Cervantes, female    DOB: [birth year], 82 y.o.   MRN: [id number]  Chief Complaint  Patient presents with   Eye Problem    Patient states she jammed a pencil or toothbrush handle into her right eye 3 days ago, complains of pain and bruising since    Eye Problem    Patient is in today for an eye injury 3 days ago, states that she isn't sure how it happened but something jabbed her right eye and since then her eyelid was bruised. States that initially her vision was ok but now its getting more blurry, states that she is seeing double sometimes. She does wear readers but does not have an eye doctor. States she thinks that maybe there is something stuck in there. States that it feels sore, like a "bruise deep inside", hurts worse when she winks the eye.states that she woke up with some crusting on the right, had to use a warm washcloth to open her eye.   ROS      Objective:    BP (!) 168/92 (BP Location: Right Arm, Patient Position: Sitting, Cuff Size: Normal)   Pulse 88   Temp 98.2 F (36.8 C) (Oral)   Ht 5\' 4"  (1.626 m)   Wt 121 lb 11.2 oz (55.2 kg)   SpO2 98%   BMI 20.89 kg/m    Physical Exam Eyes:     General:        Right eye: Discharge and hordeolum present. No foreign body.     Conjunctiva/sclera:     Right eye: Right conjunctiva is injected.      No results found for any visits on 03/15/22.      Assessment & Plan:   Problem List Items Addressed This Visit   None Visit Diagnoses     Hordeolum externum of right upper eyelid    -  Primary   Relevant Medications   doxycycline (VIBRAMYCIN) 25 MG/5ML SUSR      Has been present for at least 3 days without improvement, it is draining purulent fluid which is causing the blurriness. I have encouraged the patient to use warm compresses three times a day to help it continue to drain.  The swelling and redness has extended up towards the eyebrow and there is  diffuse upper eyelid swelling. My concern is that it might represent an early cellulitis so I am prescribing doxycycline 100 mg BID for a few days to make sure it does not evolve into cellulitis.  Meds ordered this encounter  Medications   doxycycline (VIBRAMYCIN) 25 MG/5ML SUSR    Sig: Take 20 mLs (100 mg total) by mouth 2 (two) times daily for 5 days.    Dispense:  240 mL    Refill:  0    No follow-ups on file.  Farrel Conners, MD

## 2022-03-17 ENCOUNTER — Encounter: Payer: Self-pay | Admitting: Family Medicine

## 2022-03-17 DIAGNOSIS — H00011 Hordeolum externum right upper eyelid: Secondary | ICD-10-CM

## 2022-03-18 ENCOUNTER — Ambulatory Visit: Payer: Medicare Other | Attending: Adult Health | Admitting: Physical Therapy

## 2022-03-18 ENCOUNTER — Encounter: Payer: Self-pay | Admitting: Physical Therapy

## 2022-03-18 DIAGNOSIS — M6281 Muscle weakness (generalized): Secondary | ICD-10-CM | POA: Diagnosis present

## 2022-03-18 DIAGNOSIS — R2681 Unsteadiness on feet: Secondary | ICD-10-CM | POA: Diagnosis present

## 2022-03-18 DIAGNOSIS — R262 Difficulty in walking, not elsewhere classified: Secondary | ICD-10-CM | POA: Diagnosis present

## 2022-03-18 MED ORDER — DOXYCYCLINE HYCLATE 100 MG PO TABS: 100.0000 mg | ORAL_TABLET | Freq: Two times a day (BID) | ORAL | 0 refills | Status: AC

## 2022-03-18 NOTE — Therapy (Signed)
OUTPATIENT PHYSICAL THERAPY NEURO TREATMENT/RECERT    Patient Name: Melissa Cervantes MRN: [id number] DOB:14-May-1940, 82 y.o., female Today's Date: 03/18/2022   PCP: Dorothyann Peng REFERRING PROVIDER: Dorothyann Peng, NP    PT End of Session - 03/18/22 1345     Visit Number 3    Number of Visits 6    Date for PT Re-Evaluation 04/29/22    Authorization Type Medicare    Progress Note Due on Visit 10    PT Start Time 1321   pt few minutes late   PT Stop Time 66    PT Time Calculation (min) 37 min    Activity Tolerance Patient tolerated treatment well    Behavior During Therapy Impulsive                  Past Medical History:  Diagnosis Date   Achalasia    ADHD    Allergy    Cervical dystonia    Chicken pox    Esophageal obstruction    per patient she is unable to eat solid food   Hearing loss    Meniere disease    Osteoarthritis    Spinal stenosis    Past Surgical History:  Procedure Laterality Date   BALLOON DILATION N/A 01/30/2021   Procedure: BALLOON DILATION;  Surgeon: Mauri Pole, MD;  Location: [facility];  Service: Endoscopy;  Laterality: N/A;   CESAREAN SECTION  1971, 1979   CYST EXCISION     tailbone   ESOPHAGEAL BRUSHING  01/30/2021   Procedure: ESOPHAGEAL BRUSHING;  Surgeon: Mauri Pole, MD;  Location: [facility];  Service: Endoscopy;;   ESOPHAGOGASTRODUODENOSCOPY (EGD) WITH PROPOFOL N/A 01/30/2021   Procedure: ESOPHAGOGASTRODUODENOSCOPY (EGD) WITH PROPOFOL;  Surgeon: Mauri Pole, MD;  Location: [facility];  Service: Endoscopy;  Laterality: N/A;   HELLER MYOTOMY  [id number]   NISSEN FUNDOPLICATION  [id number]   TOTAL HIP ARTHROPLASTY Left 06/2012   Patient Active Problem List   Diagnosis Date Noted   Dysphagia    Spinal stenosis 06/09/2020   Meniere disease    ADHD    Achalasia    Osteoarthritis    Arthritis of right hip 05/24/2020   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   Cervical dystonia  04/21/2020   Degenerative arthritis of right knee 04/21/2020    ONSET DATE: "quite some time"  REFERRING DIAG: R26.81 (ICD-10-CM) - Gait instability  THERAPY DIAG:  Unsteadiness on feet - Plan: PT plan of care cert/re-cert  Muscle weakness (generalized) - Plan: PT plan of care cert/re-cert  Difficulty in walking, not elsewhere classified - Plan: PT plan of care cert/re-cert  Rationale for Evaluation and Treatment: Rehabilitation  SUBJECTIVE:  SUBJECTIVE STATEMENT:  I've had a bad bad very bad week, I didn't make my last appointment because I was sick then I am moving house. Why am I not on Kelly's schedule? I feel better if I exercise a lot but that is my fault. I score myself at 40/100 right now, I'm not at 100 because I have not been exercising.   Pt accompanied by: self  PERTINENT HISTORY: cervical dystonia, Meniere's disease, spinal stenosis, left THR  PAIN:  Are you having pain? Yes: NPRS scale: 3/10 Pain location: neck  Pain description: "its coming to the end of when I get botox I have a knot I am very aware of"  Aggravating factors: turning head certain ways  Relieving factors: botox     PRECAUTIONS: Fall  WEIGHT BEARING RESTRICTIONS: No  FALLS: Has patient fallen in last 6 months? No  LIVING ENVIRONMENT: Lives with: lives alone Lives in: House/apartment Stairs: Yes: External: 1 steps; none Has following equipment at home: None  PLOF: Independent  PATIENT GOALS:   OBJECTIVE:   TODAY'S TREATMENT:   03/18/22  Objective measures for re-assessment   NMR  Nustep L4 x4 minutes BLEs only  Double step taps x5 B min guard blue foam pad  3 way toe taps on blue foam pad x5 B Criss cross on top step x5 B blue foam pad       PATIENT EDUCATION: Education details: outcome  measure scores, rationale for PT intervention Person educated: Patient Education method: Explanation, Demonstration, and Handouts Education comprehension: verbalized understanding and needs further education  HOME EXERCISE PROGRAM: Access Code: [email or web address] URL: [email or web address] Date: 02/20/2022 Prepared by: Sherlyn Lees  Exercises - Corner Balance Feet Together With Eyes Open  - 1 x daily - 7 x weekly - 3-5 sets - 30 sec hold - Corner Balance Feet Together With Eyes Closed  - 1 x daily - 7 x weekly - 3-5 sets - 30 sec hold - Corner Balance Feet Together: Eyes Closed With Head Turns  - 1 x daily - 7 x weekly - 3-5 sets - 3-5 reps - Semi-Tandem Corner Balance With Eyes Open  - 1 x daily - 7 x weekly - 3-5 sets - Semi-Tandem Corner Balance With Eyes Closed  - 1 x daily - 7 x weekly - 3-5 sets - 15-30 sec hold - Step Taps on Low Step  - 1 x daily - 7 x weekly - 3 sets - 10 reps - 3 sec hold - Split Squat Forward Trunk with Dumbbells  - 1 x daily - 7 x weekly - 3 sets - 10 reps - Half-Kneeling to Standing  - 1 x daily - 7 x weekly - 1-3 sets - 5-10 reps  DIAGNOSTIC FINDINGS:   COGNITION: Overall cognitive status: Within functional limits for tasks assessed   SENSATION: WFL  COORDINATION: WNL  EDEMA:    MUSCLE TONE:   MUSCLE LENGTH:   DTRs:    POSTURE: asymmetry with c-spine  LOWER EXTREMITY ROM:     WNL LOWER EXTREMITY MMT:    Grossly 5/5 except left hip abduction 4/5  BED MOBILITY:  indep  TRANSFERS: indepent  RAMP:  independent  CURB:  Level of Assistance: Complete Independence Assistive device utilized: None Curb Comments:   STAIRS: Level of Assistance: Modified independence Stair Negotiation Technique: Alternating Pattern  with No Rails Single Rail on Right Number of Stairs: 15  Height of Stairs: 4-6''  Comments: able to perform without HR but apprehensive  GAIT: Gait pattern:  WFL Distance walked:  Assistive device utilized:  None Level of assistance: Complete Independence Comments:   FUNCTIONAL TESTS:  5 times sit to stand: 12 sec Timed up and go (TUG):   Berg Balance Scale: 48/56 Dynamic Gait Index: 21/24  Berg 2/12 49/56, MCTSIB Moderate sway in condition 4     M-CTSIB  Condition 1: Firm Surface, EO 30 Sec, Normal Sway  Condition 2: Firm Surface, EC 30 Sec, Normal and Mild Sway  Condition 3: Foam Surface, EO 30 Sec, Normal Sway  Condition 4: Foam Surface, EC 5 Sec, Severe Sway    PATIENT SURVEYS:  N/a       GOALS: Goals reviewed with patient? Yes  SHORT TERM GOALS: Target date: same as LTG    LONG TERM GOALS: Target date: 03/20/2022    Patient will be independent in HEP to improve functional outcomes Baseline:  Goal status: IN PROGRESS  2.  Patient to teach back strategies for multi-sensory balance activities to promote self-progression and carryover for home program Baseline:  Goal status: IN PROGRESS  3.  Demo improved safety and tolerance for multi-sensory balance activities per time 15 sec condition 4 M-CTSIB w/ mild sway Baseline: 5 sec severe; 2/12-moderate sway  Goal status: IN PROGRESS  4. Demo improved balance and control per score 52/56 Berg Balance Test to improve safety during ADL  Baseline: 48/56; 2/12- 49/56  Goal status: IN PROGRESS  ASSESSMENT:  CLINICAL IMPRESSION:   [place] arrives today doing OK, really perseverating on having been sick and not having been able to do exercises. Got objective measures for recertification to MD. MCTSIB is improved, but Merrilee Jansky shows minimal improvement. Will continue PT for an additional 6 weeks/3 more visits.   OBJECTIVE IMPAIRMENTS: decreased balance.   ACTIVITY LIMITATIONS: stairs and locomotion level on uneven ground  PARTICIPATION LIMITATIONS: community activity and hiking  PERSONAL FACTORS: Time since onset of injury/illness/exacerbation and 3+ comorbidities: see "pertinent hx"  are also affecting patient's functional  outcome.   REHAB POTENTIAL: Excellent  CLINICAL DECISION MAKING: Stable/uncomplicated  EVALUATION COMPLEXITY: Low  PLAN:  PT FREQUENCY: every other week  PT DURATION: 6 weeks  PLANNED INTERVENTIONS: Therapeutic exercises, Therapeutic activity, Neuromuscular re-education, Balance training, Gait training, Patient/Family education, Self Care, Joint mobilization, Stair training, Vestibular training, Canalith repositioning, DME instructions, Aquatic Therapy, Dry Needling, Cryotherapy, Moist heat, Taping, and Manual therapy  PLAN FOR NEXT SESSION: HEP review, discuss with how to progress balance/vestibular activities.   Deniece Ree PT DPT PN2

## 2022-04-08 ENCOUNTER — Telehealth: Payer: Self-pay | Admitting: Adult Health

## 2022-04-08 ENCOUNTER — Ambulatory Visit: Payer: Medicare Other

## 2022-04-08 NOTE — Telephone Encounter (Signed)
Pt is calling and would like a refill amphetamine-dextroamphetamine (ADDERALL) 15 MG tablet [id number] [facility], [place] Phone: [phone]  Fax: [phone]

## 2022-04-09 NOTE — Telephone Encounter (Signed)
Please advise 

## 2022-04-10 ENCOUNTER — Other Ambulatory Visit: Payer: Self-pay | Admitting: Adult Health

## 2022-04-10 DIAGNOSIS — F909 Attention-deficit hyperactivity disorder, unspecified type: Secondary | ICD-10-CM

## 2022-04-10 MED ORDER — AMPHETAMINE-DEXTROAMPHETAMINE 15 MG PO TABS: 15.0000 mg | ORAL_TABLET | Freq: Two times a day (BID) | ORAL | 0 refills | Status: DC

## 2022-04-10 NOTE — Telephone Encounter (Signed)
Noted  

## 2022-04-10 NOTE — Telephone Encounter (Signed)
Patient notified of update  and verbalized understanding. 

## 2022-04-22 ENCOUNTER — Ambulatory Visit: Payer: Medicare Other | Attending: Adult Health

## 2022-04-22 DIAGNOSIS — R262 Difficulty in walking, not elsewhere classified: Secondary | ICD-10-CM | POA: Diagnosis present

## 2022-04-22 DIAGNOSIS — R2681 Unsteadiness on feet: Secondary | ICD-10-CM | POA: Diagnosis not present

## 2022-04-22 DIAGNOSIS — R293 Abnormal posture: Secondary | ICD-10-CM | POA: Diagnosis present

## 2022-04-22 DIAGNOSIS — M6281 Muscle weakness (generalized): Secondary | ICD-10-CM | POA: Diagnosis present

## 2022-04-22 NOTE — Therapy (Signed)
OUTPATIENT PHYSICAL THERAPY NEURO TREATMENT   Patient Name: Melissa Cervantes MRN: [id number] DOB:January 26, 1941, 82 y.o., female Today's Date: 04/22/2022   PCP: Dorothyann Peng REFERRING PROVIDER: Dorothyann Peng, NP    PT End of Session - 04/22/22 0932     Visit Number 4    Number of Visits 6    Date for PT Re-Evaluation 04/29/22    Authorization Type Medicare    Progress Note Due on Visit 10    PT Start Time 0932    PT Stop Time 1015    PT Time Calculation (min) 43 min    Activity Tolerance Patient tolerated treatment well    Behavior During Therapy Impulsive                  Past Medical History:  Diagnosis Date   Achalasia    ADHD    Allergy    Cervical dystonia    Chicken pox    Esophageal obstruction    per patient she is unable to eat solid food   Hearing loss    Meniere disease    Osteoarthritis    Spinal stenosis    Past Surgical History:  Procedure Laterality Date   BALLOON DILATION N/A 01/30/2021   Procedure: BALLOON DILATION;  Surgeon: Mauri Pole, MD;  Location: [facility];  Service: Endoscopy;  Laterality: N/A;   CESAREAN SECTION  1971, 1979   CYST EXCISION     tailbone   ESOPHAGEAL BRUSHING  01/30/2021   Procedure: ESOPHAGEAL BRUSHING;  Surgeon: Mauri Pole, MD;  Location: [facility];  Service: Endoscopy;;   ESOPHAGOGASTRODUODENOSCOPY (EGD) WITH PROPOFOL N/A 01/30/2021   Procedure: ESOPHAGOGASTRODUODENOSCOPY (EGD) WITH PROPOFOL;  Surgeon: Mauri Pole, MD;  Location: [facility];  Service: Endoscopy;  Laterality: N/A;   HELLER MYOTOMY  [id number]   NISSEN FUNDOPLICATION  [id number]   TOTAL HIP ARTHROPLASTY Left 06/2012   Patient Active Problem List   Diagnosis Date Noted   Dysphagia    Spinal stenosis 06/09/2020   Meniere disease    ADHD    Achalasia    Osteoarthritis    Arthritis of right hip 05/24/2020   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  Been having continued unsteadiness. Dizziness hasn't been too bad   Pt accompanied by: self  PERTINENT HISTORY: cervical dystonia, Meniere's disease, spinal stenosis, left THR  PAIN:  Are you having pain? Yes: NPRS scale: 3/10 Pain location: neck  Pain description: "its coming to the end of when I get botox I have a knot I am very aware of"  Aggravating factors: turning head certain ways  Relieving factors: botox     PRECAUTIONS: Fall  WEIGHT BEARING RESTRICTIONS: No  FALLS: Has patient fallen in last 6 months? No  LIVING ENVIRONMENT: Lives with: lives alone Lives in: House/apartment Stairs: Yes: External: 1 steps; none Has following equipment at home: None  PLOF: Independent  PATIENT GOALS:   OBJECTIVE:    TODAY'S TREATMENT: 04/22/22 Activity Comments  HEP review -modification to "step taps on low step" using trekking poles  Reactive balance  -ant, post, lateral -standing on foam-postural perturbations                     PATIENT EDUCATION: Education details:  Person educated: Patient Education method: Consulting civil engineer, Media planner, and Handouts Education comprehension: verbalized understanding and needs further education  HOME EXERCISE PROGRAM: Access Code: [email or web address] URL: [email or web address] Date: 02/20/2022 Prepared by: Sherlyn Lees  Exercises - Corner  Balance Feet Together With Eyes Open  - 1 x daily - 7 x weekly - 3-5 sets - 30 sec hold - Corner Balance Feet Together With Eyes Closed  - 1 x daily - 7 x weekly - 3-5 sets - 30 sec hold - Corner Balance Feet Together: Eyes Closed With Head Turns  - 1 x daily - 7 x weekly - 3-5 sets - 3-5 reps - Semi-Tandem Corner Balance With Eyes Open  - 1 x daily - 7 x weekly - 3-5 sets - Semi-Tandem Corner Balance With Eyes Closed  - 1 x daily - 7 x weekly - 3-5 sets - 15-30 sec hold - Step Taps on Low Step  - 1 x daily - 7 x weekly - 3 sets - 10 reps - 3 sec hold - Split Squat Forward Trunk with Dumbbells  - 1 x daily - 7 x weekly - 3 sets - 10 reps - Half-Kneeling to Standing  - 1 x daily - 7 x weekly - 1-3 sets - 5-10 reps  DIAGNOSTIC FINDINGS:   COGNITION: Overall cognitive status: Within functional limits for tasks assessed   SENSATION: WFL  COORDINATION: WNL  EDEMA:    MUSCLE TONE:   MUSCLE LENGTH:   DTRs:    POSTURE: asymmetry with c-spine  LOWER EXTREMITY ROM:     WNL LOWER EXTREMITY MMT:    Grossly 5/5 except left hip abduction 4/5  BED MOBILITY:  indep  TRANSFERS: indepent  RAMP:  independent  CURB:  Level of Assistance: Complete Independence Assistive device utilized: None Curb Comments:   STAIRS: Level of Assistance: Modified independence Stair Negotiation Technique: Alternating Pattern  with No Rails Single Rail on Right Number of Stairs: 15  Height of Stairs: 4-6''  Comments: able to perform without HR but apprehensive  GAIT: Gait pattern: WFL Distance walked:  Assistive device utilized: None Level of assistance: Complete Independence Comments:   FUNCTIONAL TESTS:  5 times sit to stand: 12 sec Timed up and go (TUG):   Berg Balance Scale: 48/56 Dynamic Gait Index: 21/24  Berg 2/12 49/56, MCTSIB Moderate sway in condition 4  M-CTSIB  Condition 1: Firm Surface, EO 30 Sec, Normal Sway  Condition 2: Firm Surface, EC 30 Sec, Normal and  Mild Sway  Condition 3: Foam Surface, EO 30 Sec, Normal Sway  Condition 4: Foam Surface, EC 5 Sec, Severe Sway    PATIENT SURVEYS:  N/a       GOALS: Goals reviewed with patient? Yes  SHORT TERM GOALS: Target date: same as LTG    LONG TERM GOALS: Target date: 03/20/2022    Patient will be independent in HEP to improve functional outcomes Baseline:  Goal status: IN PROGRESS  2.  Patient to teach back strategies for multi-sensory balance activities to promote self-progression and carryover for home program Baseline:  Goal status: IN PROGRESS  3.  Demo improved safety and tolerance for multi-sensory balance activities per time 15 sec condition 4 M-CTSIB w/ mild sway Baseline: 5 sec severe; 2/12-moderate sway  Goal status: IN PROGRESS  4. Demo improved balance and control per score 52/56 Berg Balance Test to improve safety during ADL  Baseline: 48/56; 2/12- 49/56  Goal status: IN PROGRESS  ASSESSMENT:  CLINICAL IMPRESSION:  Returns following prolonged absence due to move. HEP review and relevant modifications to improve safety with more dynamic movements such as the lunge and single leg stance using trekking poles and yoga block to modify. Demo good reaction to balance disturbance with functional ankle, hip, and stepping strategies appreciated.  Will review new HEP modifcation and plan for D/C after next session  OBJECTIVE IMPAIRMENTS: decreased balance.   ACTIVITY LIMITATIONS: stairs and locomotion level on uneven ground  PARTICIPATION LIMITATIONS: community activity and hiking  PERSONAL FACTORS: Time since onset of injury/illness/exacerbation and 3+ comorbidities: see "pertinent hx"  are also affecting patient's functional outcome.   REHAB POTENTIAL: Excellent  CLINICAL DECISION MAKING: Stable/uncomplicated  EVALUATION COMPLEXITY: Low  PLAN:  PT FREQUENCY: every other week  PT DURATION: 6 weeks  PLANNED INTERVENTIONS: Therapeutic exercises, Therapeutic  activity, Neuromuscular re-education, Balance training, Gait training, Patient/Family education, Self Care, Joint mobilization, Stair training, Vestibular training, Canalith repositioning, DME instructions, Aquatic Therapy, Dry Needling, Cryotherapy, Moist heat, Taping, and Manual therapy  PLAN FOR NEXT SESSION: HEP review/modifications, plan on D/C  10:37 AM, 04/22/22 M. Sherlyn Lees, PT, DPT Physical Therapist- [place] Office Number: [phone]

## 2022-05-06 ENCOUNTER — Ambulatory Visit: Payer: Medicare Other | Attending: Adult Health

## 2022-05-06 ENCOUNTER — Other Ambulatory Visit: Payer: Self-pay | Admitting: Internal Medicine

## 2022-05-06 DIAGNOSIS — M6281 Muscle weakness (generalized): Secondary | ICD-10-CM | POA: Diagnosis present

## 2022-05-06 DIAGNOSIS — R262 Difficulty in walking, not elsewhere classified: Secondary | ICD-10-CM | POA: Diagnosis present

## 2022-05-06 DIAGNOSIS — R293 Abnormal posture: Secondary | ICD-10-CM | POA: Insufficient documentation

## 2022-05-06 DIAGNOSIS — R2681 Unsteadiness on feet: Secondary | ICD-10-CM | POA: Insufficient documentation

## 2022-05-06 NOTE — Therapy (Signed)
OUTPATIENT PHYSICAL THERAPY NEURO TREATMENT/ Discharge Summary   Patient Name: Melissa Cervantes MRN: [id number] DOB:10-22-1940, 82 y.o., female Today's Date: 05/06/2022   PCP: Dorothyann Peng REFERRING PROVIDER: Dorothyann Peng, NP  PHYSICAL THERAPY DISCHARGE SUMMARY  Visits from Start of Care: 5  Current functional level related to goals / functional outcomes: Goals met. Demo improved score for Berg Balance Test to 53/56 indicating low risk for falls. Improved postural stability maintaining condition 4 M-CTSIB x 15 sec   Remaining deficits: N/A   Education / Equipment: HEP   Patient agrees to discharge. Patient goals were met. Patient is being discharged due to meeting the stated rehab goals.   PT End of Session - 05/06/22 0936     Visit Number 5    Number of Visits 6    Date for PT Re-Evaluation 04/29/22    Authorization Type Medicare    Progress Note Due on Visit 10    PT Start Time 0932    PT Stop Time 1015    PT Time Calculation (min) 43 min    Activity Tolerance Patient tolerated treatment well    Behavior During Therapy Impulsive                  Past Medical History:  Diagnosis Date   Achalasia    ADHD    Allergy    Cervical dystonia    Chicken pox    Esophageal obstruction    per patient she is unable to eat solid food   Hearing loss    Meniere disease    Osteoarthritis    Spinal stenosis    Past Surgical History:  Procedure Laterality Date   BALLOON DILATION N/A 01/30/2021   Procedure: BALLOON DILATION;  Surgeon: Mauri Pole, MD;  Location: [facility];  Service: Endoscopy;  Laterality: N/A;   CESAREAN SECTION  1971, 1979   CYST EXCISION     tailbone   ESOPHAGEAL BRUSHING  01/30/2021   Procedure: ESOPHAGEAL BRUSHING;  Surgeon: Mauri Pole, MD;  Location: [facility];  Service: Endoscopy;;   ESOPHAGOGASTRODUODENOSCOPY (EGD) WITH PROPOFOL N/A 01/30/2021   Procedure: ESOPHAGOGASTRODUODENOSCOPY (EGD) WITH PROPOFOL;  Surgeon:  Mauri Pole, MD;  Location: [facility];  Service: Endoscopy;  Laterality: N/A;   HELLER MYOTOMY  [id number]   NISSEN FUNDOPLICATION  [id number]   TOTAL HIP ARTHROPLASTY Left 06/2012   Patient Active Problem List   Diagnosis Date Noted   Dysphagia    Spinal stenosis 06/09/2020   Meniere disease    ADHD    Achalasia    Osteoarthritis    Arthritis of right hip 05/24/2020   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   Degenerative arthritis of right knee 04/21/2020    ONSET DATE: "quite some time"  REFERRING DIAG: R26.81 (ICD-10-CM) - Gait instability  THERAPY DIAG:  Unsteadiness on feet  Muscle weakness (generalized)  Difficulty in walking, not elsewhere classified  Abnormal posture  Rationale for Evaluation and Treatment: Rehabilitation  SUBJECTIVE:  SUBJECTIVE STATEMENT:  No new issues  Pt accompanied by: self  PERTINENT HISTORY: cervical dystonia, Meniere's disease, spinal stenosis, left THR  PAIN:  Are you having pain? Yes: NPRS scale: 3/10 Pain location: neck  Pain description: "its coming to the end of when I get botox I have a knot I am very aware of"  Aggravating factors: turning head certain ways  Relieving factors: botox     PRECAUTIONS: Fall  WEIGHT BEARING RESTRICTIONS: No  FALLS: Has patient fallen in last 6 months? No  LIVING ENVIRONMENT: Lives with: lives alone Lives in: House/apartment Stairs: Yes: External: 1 steps; none Has following equipment at home: None  PLOF: Independent  PATIENT GOALS:   OBJECTIVE:   TODAY'S TREATMENT: 05/06/22 Activity Comments  M-CTSIB Condition 1-2: normal Condition 3: normal Condition 4: mild-mod 15 sec  Berg Balance Test   HEP review                  PATIENT  EDUCATION: Education details:  Person educated: Patient Education method: Consulting civil engineer, Media planner, and Handouts Education comprehension: verbalized understanding and needs further education  HOME EXERCISE PROGRAM: Access Code: [email or web address] URL: [email or web address] Date: 02/20/2022 Prepared by: Sherlyn Lees  Exercises - Corner Balance Feet Together With Eyes Open  - 1 x daily - 7 x weekly - 3-5 sets - 30 sec hold - Corner Balance Feet Together With Eyes Closed  - 1 x daily - 7 x weekly - 3-5 sets - 30 sec hold - Corner Balance Feet Together: Eyes Closed With Head Turns  - 1 x daily - 7 x weekly - 3-5 sets - 3-5 reps - Semi-Tandem Corner Balance With Eyes Open  - 1 x daily - 7 x weekly - 3-5 sets - Semi-Tandem Corner Balance With Eyes Closed  - 1 x daily - 7 x weekly - 3-5 sets - 15-30 sec hold - Step Taps on Low Step  - 1 x daily - 7 x weekly - 3 sets - 10 reps - 3 sec hold - Split Squat Forward Trunk with Dumbbells  - 1 x daily - 7 x weekly - 3 sets - 10 reps - Half-Kneeling to Standing  - 1 x daily - 7 x weekly - 1-3 sets - 5-10 reps  DIAGNOSTIC FINDINGS:   COGNITION: Overall cognitive status: Within functional limits for tasks assessed   SENSATION: WFL  COORDINATION: WNL  EDEMA:    MUSCLE TONE:   MUSCLE LENGTH:   DTRs:    POSTURE: asymmetry with c-spine  LOWER EXTREMITY ROM:     WNL LOWER EXTREMITY MMT:    Grossly 5/5 except left hip abduction 4/5  BED MOBILITY:  indep  TRANSFERS: indepent  RAMP:  independent  CURB:  Level of Assistance: Complete Independence Assistive device utilized: None Curb Comments:   STAIRS: Level of Assistance: Modified independence Stair Negotiation Technique: Alternating Pattern  with No Rails Single Rail on Right Number of Stairs: 15  Height of Stairs: 4-6''  Comments: able to perform without HR but apprehensive  GAIT: Gait pattern: WFL Distance walked:  Assistive device utilized: None Level of  assistance: Complete Independence Comments:   FUNCTIONAL TESTS:  5 times sit to stand: 12 sec Timed up and go (TUG):   Berg Balance Scale: 48/56 Dynamic Gait Index: 21/24  Berg 2/12 49/56, MCTSIB Moderate sway in condition 4     M-CTSIB  Condition 1: Firm Surface, EO 30 Sec, Normal Sway  Condition 2: Firm Surface, EC 30 Sec, Normal and Mild Sway  Condition 3: Foam Surface, EO 30 Sec, Normal Sway  Condition 4: Foam Surface, EC 5 Sec, Severe Sway    PATIENT SURVEYS:  N/a       GOALS: Goals reviewed with patient? Yes  SHORT TERM GOALS: Target date: same as LTG    LONG TERM GOALS: Target date: 03/20/2022    Patient will be independent in HEP to improve functional outcomes Baseline:  Goal status: MET  2.  Patient to teach back strategies for multi-sensory balance activities to promote self-progression and carryover for home program Baseline:  Goal status: MET  3.  Demo improved safety and tolerance for multi-sensory balance activities per time 15 sec condition 4 M-CTSIB w/ mild sway Baseline: 5 sec severe; 2/12-moderate sway  Goal status: MET  4. Demo improved balance and control per score 52/56 Berg Balance Test to improve safety during ADL  Baseline: 48/56; 2/12- 49/56; 4/1-53/56  Goal status: MET  ASSESSMENT:  CLINICAL IMPRESSION: POC details reviewed and demo improved score for Berg Balance Test from previous 49/56 to 53/56 demo most improvement in maintaining balance with narrow BOS and single limb stance.  Pt demo good adherence to HEP for balance and LE strength for mobility with emphasis on ability to arise from floor to stand with modifications. Will D/C to HEP at this time  OBJECTIVE IMPAIRMENTS: decreased balance.   ACTIVITY LIMITATIONS: stairs and locomotion level on uneven ground  PARTICIPATION LIMITATIONS: community activity and hiking  PERSONAL FACTORS: Time since onset of injury/illness/exacerbation and 3+ comorbidities: see "pertinent hx"   are also affecting patient's functional outcome.   REHAB POTENTIAL: Excellent  CLINICAL DECISION MAKING: Stable/uncomplicated  EVALUATION COMPLEXITY: Low  PLAN:  PT FREQUENCY: every other week  PT DURATION: 6 weeks  PLANNED INTERVENTIONS: Therapeutic exercises, Therapeutic activity, Neuromuscular re-education, Balance training, Gait training, Patient/Family education, Self Care, Joint mobilization, Stair training, Vestibular training, Canalith repositioning, DME instructions, Aquatic Therapy, Dry Needling, Cryotherapy, Moist heat, Taping, and Manual therapy  PLAN FOR NEXT SESSION: D/C  9:37 AM, 05/06/22 M. Sherlyn Lees, PT, DPT Physical Therapist- [place] Office Number: [phone]

## 2022-05-08 ENCOUNTER — Telehealth: Payer: Self-pay | Admitting: Adult Health

## 2022-05-08 NOTE — Telephone Encounter (Signed)
Prescription Request  05/08/2022  LOV: 02/08/2022  What is the name of the medication or equipment? amphetamine-dextroamphetamine (ADDERALL) 15 MG tablet  amphetamine-dextroamphetamine (ADDERALL) 15 MG tablet  amphetamine-dextroamphetamine (ADDERALL) 15 MG tablet  Have you contacted your pharmacy to request a refill? Yes   Which pharmacy would you like this sent to?  [facility], [place] - [address] [place] [address] Phone: [phone] Fax: [phone]    Patient notified that their request is being sent to the clinical staff for review and that they should receive a response within 2 business days.   Please advise at Mobile [phone] (mobile)

## 2022-05-08 NOTE — Telephone Encounter (Signed)
Okay for refill?  

## 2022-05-09 ENCOUNTER — Other Ambulatory Visit: Payer: Self-pay | Admitting: Adult Health

## 2022-05-09 DIAGNOSIS — F909 Attention-deficit hyperactivity disorder, unspecified type: Secondary | ICD-10-CM

## 2022-05-09 MED ORDER — AMPHETAMINE-DEXTROAMPHETAMINE 15 MG PO TABS: 15.0000 mg | ORAL_TABLET | Freq: Two times a day (BID) | ORAL | 0 refills | Status: DC

## 2022-05-09 NOTE — Telephone Encounter (Signed)
Noted  

## 2022-05-20 ENCOUNTER — Telehealth: Payer: Self-pay | Admitting: Adult Health

## 2022-05-20 NOTE — Telephone Encounter (Signed)
Contacted Melissa Cervantes to schedule their annual wellness visit. Appointment made for 05/24/22.  Melissa Cervantes AWV direct phone # [phone]

## 2022-05-24 ENCOUNTER — Ambulatory Visit (INDEPENDENT_AMBULATORY_CARE_PROVIDER_SITE_OTHER): Payer: Medicare Other

## 2022-05-24 VITALS — Ht 61.0 in | Wt 118.0 lb

## 2022-05-24 DIAGNOSIS — Z0000 Encounter for general adult medical examination without abnormal findings: Secondary | ICD-10-CM

## 2022-05-24 NOTE — Progress Notes (Signed)
Subjective:   Melissa Cervantes is a 82 y.o. female who presents for Medicare Annual (Subsequent) preventive examination.  Review of Systems    Virtual Visit via Telephone Note  I connected with  Melissa Cervantes on 05/24/22 at 11:30 AM EDT by telephone and verified that I am speaking with the correct person using two identifiers.  Location: Patient: Home Provider: Office Persons participating in the virtual visit: patient/Nurse Health Advisor   I discussed the limitations, risks, security and privacy concerns of performing an evaluation and management service by telephone and the availability of in person appointments. The patient expressed understanding and agreed to proceed.  Interactive audio and video telecommunications were attempted between this nurse and patient, however failed, due to patient having technical difficulties OR patient did not have access to video capability.  We continued and completed visit with audio only.  Some vital signs may be absent or patient reported.   Tillie Rung, LPN  Cardiac Risk Factors include: advanced age (>60men, >32 women)     Objective:    Today's Vitals   05/24/22 1136  Weight: 118 lb (53.5 kg)  Height: 5\' 1"  (1.549 m)   Body mass index is 22.3 kg/m.     05/24/2022   11:46 AM 02/20/2022   12:55 PM 01/30/2021   11:26 AM 01/02/2021    1:54 PM 06/25/2020    8:25 PM 06/21/2020    2:16 PM  Advanced Directives  Does Patient Have a Medical Advance Directive? Yes Yes No No No No  Type of Estate agent of [place];Living will       Does patient want to make changes to medical advance directive?  No - Patient declined      Copy of Healthcare Power of Attorney in Chart? No - copy requested       Would patient like information on creating a medical advance directive?   No - Patient declined  No - Patient declined Yes (Inpatient - patient defers creating a medical advance directive at this time - Information given)     Current Medications (verified) Outpatient Encounter Medications as of 05/24/2022  Medication Sig   albuterol (VENTOLIN HFA) 108 (90 Base) MCG/ACT inhaler INHALE 1-2 PUFFS BY MOUTH EVERY 6 HOURS AS NEEDED FOR WHEEZE OR SHORTNESS OF BREATH (Patient not taking: Reported on 05/24/2022)   amphetamine-dextroamphetamine (ADDERALL) 15 MG tablet Take 1 tablet by mouth 2 (two) times daily. (Patient not taking: Reported on 05/24/2022)   amphetamine-dextroamphetamine (ADDERALL) 15 MG tablet Take 1 tablet by mouth 2 (two) times daily.   amphetamine-dextroamphetamine (ADDERALL) 15 MG tablet Take 1 tablet by mouth 2 (two) times daily.   ARNUITY ELLIPTA 100 MCG/ACT AEPB TAKE 1 PUFF BY MOUTH EVERY DAY   atorvastatin (LIPITOR) 20 MG tablet Take 1 tablet (20 mg total) by mouth daily.   clonazePAM (KLONOPIN) 0.5 MG tablet Take 0.5 mg by mouth at bedtime.   fluticasone (FLONASE) 50 MCG/ACT nasal spray    montelukast (SINGULAIR) 10 MG tablet Take 1 tablet (10 mg total) by mouth at bedtime.   triamcinolone cream (KENALOG) 0.1 % Apply 1 Application topically 2 (two) times daily.   No facility-administered encounter medications on file as of 05/24/2022.    Allergies (verified) Levonorgestrel-ethinyl estrad and Other   History: Past Medical History:  Diagnosis Date   Achalasia    ADHD    Allergy    Cervical dystonia    Chicken pox    Esophageal obstruction    per patient  she is unable to eat solid food   Hearing loss    Meniere disease    Osteoarthritis    Spinal stenosis    Past Surgical History:  Procedure Laterality Date   BALLOON DILATION N/A 01/30/2021   Procedure: BALLOON DILATION;  Surgeon: Napoleon Form, MD;  [facility]: [facility];  Service: Endoscopy;  Laterality: N/A;   CESAREAN SECTION  1971, 1979   CYST EXCISION     tailbone   ESOPHAGEAL BRUSHING  01/30/2021   Procedure: ESOPHAGEAL BRUSHING;  Surgeon: Napoleon Form, MD;  Location: [facility];  Service: Endoscopy;;    ESOPHAGOGASTRODUODENOSCOPY (EGD) WITH PROPOFOL N/A 01/30/2021   Procedure: ESOPHAGOGASTRODUODENOSCOPY (EGD) WITH PROPOFOL;  Surgeon: Napoleon Form, MD;  Location: [facility];  Service: Endoscopy;  Laterality: N/A;   HELLER MYOTOMY  08/2005   NISSEN FUNDOPLICATION  2007   TOTAL HIP ARTHROPLASTY Left 06/2012   Family History  Problem Relation Age of Onset   Heart disease Mother    Heart disease Father    Alcohol abuse Father    Dystonia Sister    Dystonia Brother    Colon cancer Neg Hx    Esophageal cancer Neg Hx    Stomach cancer Neg Hx    Social History   Socioeconomic History   Marital status: Divorced    Spouse name: Not on file   Number of children: Not on file   Years of education: Not on file   Highest education level: Master's degree (e.g., MA, MS, MEng, MEd, MSW, MBA)  Occupational History   Not on file  Tobacco Use   Smoking status: Former   Smokeless tobacco: Never  Vaping Use   Vaping Use: Never used  Substance and Sexual Activity   Alcohol use: Yes    Comment: 4   Drug use: Not Currently    Types: Marijuana   Sexual activity: Not Currently  Other Topics Concern   Not on file  Social History Narrative   Not on file   Social Determinants of Health   Financial Resource Strain: Low Risk  (05/24/2022)   Overall Financial Resource Strain (CARDIA)    Difficulty of Paying Living Expenses: Not hard at all  Food Insecurity: No Food Insecurity (05/24/2022)   Hunger Vital Sign    Worried About Running Out of Food in the Last Year: Never true    Ran Out of Food in the Last Year: Never true  Transportation Needs: No Transportation Needs (05/24/2022)   PRAPARE - Administrator, Civil Service (Medical): No    Lack of Transportation (Non-Medical): No  Physical Activity: Sufficiently Active (05/24/2022)   Exercise Vital Sign    Days of Exercise per Week: 4 days    Minutes of Exercise per Session: 60 min  Stress: No Stress Concern Present (05/24/2022)    [employer] of Occupational Health - Occupational Stress Questionnaire    Feeling of Stress : Not at all  Social Connections: Socially Isolated (05/24/2022)   Social Connection and Isolation Panel [NHANES]    Frequency of Communication with Friends and Family: More than three times a week    Frequency of Social Gatherings with Friends and Family: More than three times a week    Attends Religious Services: Never    Database administrator or Organizations: No    Attends Banker Meetings: Never    Marital Status: Divorced    Tobacco Counseling Counseling given: Not Answered   Clinical Intake:  Consulting civil engineer  completed: No  Pain : No/denies pain     BMI - recorded: 22.3 Nutritional Status: BMI of 19-24  Normal Nutritional Risks: None Diabetes: No  How often do you need to have someone help you when you read instructions, pamphlets, or other written materials from your doctor or pharmacy?: 1 - Never  Diabetic?  No  Interpreter Needed?: No  Information entered by :: Theresa Mulligan LPN   Activities of Daily Living    05/24/2022   11:45 AM  In your present state of health, do you have any difficulty performing the following activities:  Hearing? 1  Comment Wears hearing aids  Vision? 0  Difficulty concentrating or making decisions? 0  Walking or climbing stairs? 0  Dressing or bathing? 0  Doing errands, shopping? 0  Preparing Food and eating ? N  Using the Toilet? N  In the past six months, have you accidently leaked urine? Y  Comment Followed by PCP  Do you have problems with loss of bowel control? N  Managing your Medications? N  Managing your Finances? N  Housekeeping or managing your Housekeeping? N    Patient Care Team: Shirline Frees, NP as PCP - General (Family Medicine)  Indicate any recent Medical Services you may have received from other than [facility] providers in the past year (date may be approximate).     Assessment:    This is a routine wellness examination for [place].  Hearing/Vision screen Hearing Screening - Comments:: Wears hearing aids Vision Screening - Comments:: Wears rx glasses - Not up to date with routine eye exams Deferred   Dietary issues and exercise activities discussed: Exercise limited by: None identified   Goals Addressed               This Visit's Progress     Stay healthy (pt-stated)         Depression Screen    05/24/2022   11:43 AM 08/22/2021    2:22 PM 05/24/2021   10:40 AM 05/18/2021    1:20 PM 10/04/2020   10:14 AM  PHQ 2/9 Scores  PHQ - 2 Score 0 2 2 0 0  PHQ- 9 Score  5 5      Fall Risk    05/24/2022   11:45 AM 05/18/2021    1:28 PM 05/15/2021    5:56 PM  Fall Risk   Falls in the past year? 0 0 0  Number falls in past yr: 0 0 0  Injury with Fall? 0 0 0  Risk for fall due to : No Fall Risks No Fall Risks   Follow up Falls prevention discussed      FALL RISK PREVENTION PERTAINING TO THE HOME:  Any stairs in or around the home? No  If so, are there any without handrails? No  Home free of loose throw rugs in walkways, pet beds, electrical cords, etc? Yes  Adequate lighting in your home to reduce risk of falls? Yes   ASSISTIVE DEVICES UTILIZED TO PREVENT FALLS:  Life alert? No  Use of a cane, walker or w/c? No  Grab bars in the bathroom? No  Shower chair or bench in shower? Yes  Elevated toilet seat or a handicapped toilet? No   TIMED UP AND GO:  Was the test performed? No . Audio Visit   Cognitive Function:        05/24/2022   11:46 AM  6CIT Screen  What Year? 0 points  What month? 0 points  What time?  0 points  Count back from 20 0 points  Months in reverse 0 points  Repeat phrase 0 points  Total Score 0 points    Immunizations Immunization History  Administered Date(s) Administered   Covid-19, Mrna,Vaccine(Spikevax)69yrs and older 01/19/2022   Fluad Quad(high Dose 65+) 10/11/2021   Influenza, High Dose Seasonal PF 10/05/2018,  10/23/2019   Influenza,inj,quad, With Preservative 10/06/2019   Influenza-Unspecified 11/13/2008, 11/03/2012, 10/06/2019, 11/23/2020   Moderna Sars-Covid-2 Vaccination 03/08/2019, 04/07/2019, 12/03/2019   Pneumococcal Conjugate-13 06/15/2013, 10/06/2019   Pneumococcal Polysaccharide-23 10/06/2018, 10/06/2019   Tdap 01/02/2021   Zoster Recombinat (Shingrix) 10/05/2018, 01/22/2019    TDAP status: Up to date  Flu Vaccine status: Up to date  Pneumococcal vaccine status: Up to date  Covid-19 vaccine status: Completed vaccines  Qualifies for Shingles Vaccine? Yes   Zostavax completed Yes   Shingrix Completed?: Yes  Screening Tests Health Maintenance  Topic Date Due   COVID-19 Vaccine (5 - 2023-24 season) 06/09/2022 (Originally 03/16/2022)   INFLUENZA VACCINE  09/05/2022   Medicare Annual Wellness (AWV)  05/24/2023   DTaP/Tdap/Td (2 - Td or Tdap) 01/03/2031   Pneumonia Vaccine 36+ Years old  Completed   DEXA SCAN  Completed   Zoster Vaccines- Shingrix  Completed   HPV VACCINES  Aged Out    Health Maintenance  There are no preventive care reminders to display for this patient.   Colorectal cancer screening: No longer required.   Mammogram status: No longer required due to Age.  Bone Density status: Completed 06/08/13. Results reflect: Bone density results: OSTEOPOROSIS. Repeat every   years.  Lung Cancer Screening: (Low Dose CT Chest recommended if Age 62-80 years, 30 pack-year currently smoking OR have quit w/in 15years.) does not qualify.     Additional Screening:  Hepatitis C Screening: does not qualify; Completed   Vision Screening: Recommended annual ophthalmology exams for early detection of glaucoma and other disorders of the eye. Is the patient up to date with their annual eye exam?  No  Who is the provider or what is the name of the office in which the patient attends annual eye exams? Deferred If pt is not established with a provider, would they like to be  referred to a provider to establish care? No .   Dental Screening: Recommended annual dental exams for proper oral hygiene  Community Resource Referral / Chronic Care Management:  CRR required this visit?  No   CCM required this visit?  No      Plan:     I have personally reviewed and noted the following in the patient's chart:   Medical and social history Use of alcohol, tobacco or illicit drugs  Current medications and supplements including opioid prescriptions. Patient is not currently taking opioid prescriptions. Functional ability and status Nutritional status Physical activity Advanced directives List of other physicians Hospitalizations, surgeries, and ER visits in previous 12 months Vitals Screenings to include cognitive, depression, and falls Referrals and appointments  In addition, I have reviewed and discussed with patient certain preventive protocols, quality metrics, and best practice recommendations. A written personalized care plan for preventive services as well as general preventive health recommendations were provided to patient.     Tillie Rung, LPN   03/06/8655   Nurse Notes: Patient request f/u with concerns of medication Atorvastatin side effects.

## 2022-05-24 NOTE — Patient Instructions (Addendum)
Melissa Cervantes , Thank you for taking time to come for your Medicare Wellness Visit. I appreciate your ongoing commitment to your health goals. Please review the following plan we discussed and let me know if I can assist you in the future.   These are the goals we discussed:  Goals       Stay healthy (pt-stated)        This is a list of the screening recommended for you and due dates:  Health Maintenance  Topic Date Due   COVID-19 Vaccine (5 - 2023-24 season) 06/09/2022*   Flu Shot  09/05/2022   Medicare Annual Wellness Visit  05/24/2023   DTaP/Tdap/Td vaccine (2 - Td or Tdap) 01/03/2031   Pneumonia Vaccine  Completed   DEXA scan (bone density measurement)  Completed   Zoster (Shingles) Vaccine  Completed   HPV Vaccine  Aged Out  *Topic was postponed. The date shown is not the original due date.    Advanced directives: Please bring a copy of your health care power of attorney and living will to the office to be added to your chart at your convenience.   Conditions/risks identified: None  Next appointment: Follow up in one year for your annual wellness visit    Preventive Care 65 Years and Older, Female Preventive care refers to lifestyle choices and visits with your health care provider that can promote health and wellness. What does preventive care include? A yearly physical exam. This is also called an annual well check. Dental exams once or twice a year. Routine eye exams. Ask your health care provider how often you should have your eyes checked. Personal lifestyle choices, including: Daily care of your teeth and gums. Regular physical activity. Eating a healthy diet. Avoiding tobacco and drug use. Limiting alcohol use. Practicing safe sex. Taking low-dose aspirin every day. Taking vitamin and mineral supplements as recommended by your health care provider. What happens during an annual well check? The services and screenings done by your health care provider during  your annual well check will depend on your age, overall health, lifestyle risk factors, and family history of disease. Counseling  Your health care provider may ask you questions about your: Alcohol use. Tobacco use. Drug use. Emotional well-being. Home and relationship well-being. Sexual activity. Eating habits. History of falls. Memory and ability to understand (cognition). Work and work Astronomer. Reproductive health. Screening  You may have the following tests or measurements: Height, weight, and BMI. Blood pressure. Lipid and cholesterol levels. These may be checked every 5 years, or more frequently if you are over 41 years old. Skin check. Lung cancer screening. You may have this screening every year starting at age 4 if you have a 30-pack-year history of smoking and currently smoke or have quit within the past 15 years. Fecal occult blood test (FOBT) of the stool. You may have this test every year starting at age 38. Flexible sigmoidoscopy or colonoscopy. You may have a sigmoidoscopy every 5 years or a colonoscopy every 10 years starting at age 57. Hepatitis C blood test. Hepatitis B blood test. Sexually transmitted disease (STD) testing. Diabetes screening. This is done by checking your blood sugar (glucose) after you have not eaten for a while (fasting). You may have this done every 1-3 years. Bone density scan. This is done to screen for osteoporosis. You may have this done starting at age 71. Mammogram. This may be done every 1-2 years. Talk to your health care provider about how often you  should have regular mammograms. Talk with your health care provider about your test results, treatment options, and if necessary, the need for more tests. Vaccines  Your health care provider may recommend certain vaccines, such as: Influenza vaccine. This is recommended every year. Tetanus, diphtheria, and acellular pertussis (Tdap, Td) vaccine. You may need a Td booster every 10  years. Zoster vaccine. You may need this after age 30. Pneumococcal 13-valent conjugate (PCV13) vaccine. One dose is recommended after age 83. Pneumococcal polysaccharide (PPSV23) vaccine. One dose is recommended after age 20. Talk to your health care provider about which screenings and vaccines you need and how often you need them. This information is not intended to replace advice given to you by your health care provider. Make sure you discuss any questions you have with your health care provider. Document Released: 02/17/2015 Document Revised: 10/11/2015 Document Reviewed: 11/22/2014 Elsevier Interactive Patient Education  2017 [place] Prevention in the Home Falls can cause injuries. They can happen to people of all ages. There are many things you can do to make your home safe and to help prevent falls. What can I do on the outside of my home? Regularly fix the edges of walkways and driveways and fix any cracks. Remove anything that might make you trip as you walk through a door, such as a raised step or threshold. Trim any bushes or trees on the path to your home. Use bright outdoor lighting. Clear any walking paths of anything that might make someone trip, such as rocks or tools. Regularly check to see if handrails are loose or broken. Make sure that both sides of any steps have handrails. Any raised decks and porches should have guardrails on the edges. Have any leaves, snow, or ice cleared regularly. Use sand or salt on walking paths during winter. Clean up any spills in your garage right away. This includes oil or grease spills. What can I do in the bathroom? Use night lights. Install grab bars by the toilet and in the tub and shower. Do not use towel bars as grab bars. Use non-skid mats or decals in the tub or shower. If you need to sit down in the shower, use a plastic, non-slip stool. Keep the floor dry. Clean up any water that spills on the floor as soon as it  happens. Remove soap buildup in the tub or shower regularly. Attach bath mats securely with double-sided non-slip rug tape. Do not have throw rugs and other things on the floor that can make you trip. What can I do in the bedroom? Use night lights. Make sure that you have a light by your bed that is easy to reach. Do not use any sheets or blankets that are too big for your bed. They should not hang down onto the floor. Have a firm chair that has side arms. You can use this for support while you get dressed. Do not have throw rugs and other things on the floor that can make you trip. What can I do in the kitchen? Clean up any spills right away. Avoid walking on wet floors. Keep items that you use a lot in easy-to-reach places. If you need to reach something above you, use a strong step stool that has a grab bar. Keep electrical cords out of the way. Do not use floor polish or wax that makes floors slippery. If you must use wax, use non-skid floor wax. Do not have throw rugs and other things on the  floor that can make you trip. What can I do with my stairs? Do not leave any items on the stairs. Make sure that there are handrails on both sides of the stairs and use them. Fix handrails that are broken or loose. Make sure that handrails are as long as the stairways. Check any carpeting to make sure that it is firmly attached to the stairs. Fix any carpet that is loose or worn. Avoid having throw rugs at the top or bottom of the stairs. If you do have throw rugs, attach them to the floor with carpet tape. Make sure that you have a light switch at the top of the stairs and the bottom of the stairs. If you do not have them, ask someone to add them for you. What else can I do to help prevent falls? Wear shoes that: Do not have high heels. Have rubber bottoms. Are comfortable and fit you well. Are closed at the toe. Do not wear sandals. If you use a stepladder: Make sure that it is fully opened.  Do not climb a closed stepladder. Make sure that both sides of the stepladder are locked into place. Ask someone to hold it for you, if possible. Clearly mark and make sure that you can see: Any grab bars or handrails. First and last steps. Where the edge of each step is. Use tools that help you move around (mobility aids) if they are needed. These include: Canes. Walkers. Scooters. Crutches. Turn on the lights when you go into a dark area. Replace any light bulbs as soon as they burn out. Set up your furniture so you have a clear path. Avoid moving your furniture around. If any of your floors are uneven, fix them. If there are any pets around you, be aware of where they are. Review your medicines with your doctor. Some medicines can make you feel dizzy. This can increase your chance of falling. Ask your doctor what other things that you can do to help prevent falls. This information is not intended to replace advice given to you by your health care provider. Make sure you discuss any questions you have with your health care provider. Document Released: 11/17/2008 Document Revised: 06/29/2015 Document Reviewed: 02/25/2014 Elsevier Interactive Patient Education  2017 [employer].

## 2022-06-12 ENCOUNTER — Telehealth: Payer: Self-pay | Admitting: Adult Health

## 2022-06-12 DIAGNOSIS — R21 Rash and other nonspecific skin eruption: Secondary | ICD-10-CM

## 2022-06-12 MED ORDER — TRIAMCINOLONE ACETONIDE 0.1 % EX CREA: 1.0000 | TOPICAL_CREAM | Freq: Two times a day (BID) | CUTANEOUS | 0 refills | Status: DC

## 2022-06-12 NOTE — Telephone Encounter (Signed)
Rx refilled.

## 2022-06-12 NOTE — Telephone Encounter (Signed)
Prescription Request  06/12/2022  LOV: 02/08/2022  What is the name of the medication or equipment? amphetamine-dextroamphetamine (ADDERALL) 15 MG tablet and triamcinolone cream (KENALOG) 0.1 %   Have you contacted your pharmacy to request a refill? No   Which pharmacy would you like this sent to?  [id number] - [place], [place] - [address] [place] [address] Phone: [phone] Fax: [phone]    Patient notified that their request is being sent to the clinical staff for review and that they should receive a response within 2 business days.   Please advise at Mobile [phone] (mobile)

## 2022-06-12 NOTE — Telephone Encounter (Signed)
Pt is calling and would like to come in and have her  chole check before her appt on 06-18-2022.

## 2022-06-12 NOTE — Telephone Encounter (Signed)
Okay for refill?  

## 2022-06-13 ENCOUNTER — Ambulatory Visit: Payer: Medicare Other | Admitting: Adult Health

## 2022-06-14 NOTE — Telephone Encounter (Signed)
Left message to return phone call.

## 2022-06-14 NOTE — Telephone Encounter (Signed)
Pt called back stating that she is going to cancel her appt until she hears back from [facility] about placing orders for bloodwork to check her cholesterol. She is not sure what he should check but she feels like she need bloodwork before she even sched an appt to see [facility].   Would like a call back from CMA to see if the orders can be placed and to sched a lab appt.   Please advise.

## 2022-06-14 NOTE — Telephone Encounter (Signed)
Pt states she will just want to see cory to discuss getting blood work

## 2022-06-14 NOTE — Telephone Encounter (Signed)
Called pt no answer. Not sure what blood work pt is referring to. Pt had a full panel Jan. 2024.

## 2022-06-18 ENCOUNTER — Ambulatory Visit (INDEPENDENT_AMBULATORY_CARE_PROVIDER_SITE_OTHER): Payer: Medicare Other | Admitting: Adult Health

## 2022-06-18 VITALS — BP 150/80 | HR 62 | Temp 97.4°F | Ht 61.0 in | Wt 123.0 lb

## 2022-06-18 DIAGNOSIS — E782 Mixed hyperlipidemia: Secondary | ICD-10-CM | POA: Diagnosis not present

## 2022-06-18 DIAGNOSIS — F909 Attention-deficit hyperactivity disorder, unspecified type: Secondary | ICD-10-CM | POA: Diagnosis not present

## 2022-06-18 NOTE — Progress Notes (Signed)
Subjective:    Patient ID: Melissa Cervantes, female    DOB: 1940-02-26, 82 y.o.   MRN: [id number]  HPI 82 year old female who  has a past medical history of Achalasia, ADHD, Allergy, Cervical dystonia, Chicken pox, Esophageal obstruction, Hearing loss, Meniere disease, Osteoarthritis, and Spinal stenosis.  She presents to the office today for follow up regarding ADHD and hyperlipidemia.   ADHD - she is controlled on Adderall 15 mg twice daily. She is tolerating this medication well   Hyperlipidemia - she was placed on Lipitor 20 mg about 5 months ago. She could not tolerate the 20 mg dose so we have her take 10 mg - she reports that she tolerates this dose better but continues to have severe fatigue and some muscle pain. The fatigue is interfering with her activity.  Lab Results  Component Value Date   CHOL 219 (H) 02/12/2022   HDL 76.40 02/12/2022   LDLCALC 124 (H) 02/12/2022   TRIG 94.0 02/12/2022   CHOLHDL 3 02/12/2022   Review of Systems See HPI   Past Medical History:  Diagnosis Date   Achalasia    ADHD    Allergy    Cervical dystonia    Chicken pox    Esophageal obstruction    per patient she is unable to eat solid food   Hearing loss    Meniere disease    Osteoarthritis    Spinal stenosis     Social History   Socioeconomic History   Marital status: Divorced    Spouse name: Not on file   Number of children: Not on file   Years of education: Not on file   Highest education level: Master's degree (e.g., MA, MS, MEng, MEd, MSW, MBA)  Occupational History   Not on file  Tobacco Use   Smoking status: Former   Smokeless tobacco: Never  Vaping Use   Vaping Use: Never used  Substance and Sexual Activity   Alcohol use: Yes    Comment: 4   Drug use: Not Currently    Types: Marijuana   Sexual activity: Not Currently  Other Topics Concern   Not on file  Social History Narrative   Not on file   Social Determinants of Health   Financial Resource Strain: Low  Risk  (05/24/2022)   Overall Financial Resource Strain (CARDIA)    Difficulty of Paying Living Expenses: Not hard at all  Food Insecurity: No Food Insecurity (05/24/2022)   Hunger Vital Sign    Worried About Running Out of Food in the Last Year: Never true    Ran Out of Food in the Last Year: Never true  Transportation Needs: No Transportation Needs (05/24/2022)   PRAPARE - Administrator, Civil Service (Medical): No    Lack of Transportation (Non-Medical): No  Physical Activity: Sufficiently Active (05/24/2022)   Exercise Vital Sign    Days of Exercise per Week: 4 days    Minutes of Exercise per Session: 60 min  Stress: No Stress Concern Present (05/24/2022)   [employer] of Occupational Health - Occupational Stress Questionnaire    Feeling of Stress : Not at all  Social Connections: Socially Isolated (05/24/2022)   Social Connection and Isolation Panel [NHANES]    Frequency of Communication with Friends and Family: More than three times a week    Frequency of Social Gatherings with Friends and Family: More than three times a week    Attends Religious Services: Never    Active Member  of Clubs or Organizations: No    Attends Banker Meetings: Never    Marital Status: Divorced  Catering manager Violence: Not At Risk (05/24/2022)   Humiliation, Afraid, Rape, and Kick questionnaire    Fear of Current or Ex-Partner: No    Emotionally Abused: No    Physically Abused: No    Sexually Abused: No    Past Surgical History:  Procedure Laterality Date   BALLOON DILATION N/A 01/30/2021   Procedure: BALLOON DILATION;  Surgeon: Napoleon Form, MD;  Location: [facility];  Service: Endoscopy;  Laterality: N/A;   CESAREAN SECTION  1971, 1979   CYST EXCISION     tailbone   ESOPHAGEAL BRUSHING  01/30/2021   Procedure: ESOPHAGEAL BRUSHING;  Surgeon: Napoleon Form, MD;  Location: [facility];  Service: Endoscopy;;   ESOPHAGOGASTRODUODENOSCOPY (EGD) WITH  PROPOFOL N/A 01/30/2021   Procedure: ESOPHAGOGASTRODUODENOSCOPY (EGD) WITH PROPOFOL;  Surgeon: Napoleon Form, MD;  Location: [facility];  Service: Endoscopy;  Laterality: N/A;   HELLER MYOTOMY  08/2005   NISSEN FUNDOPLICATION  2007   TOTAL HIP ARTHROPLASTY Left 06/2012    Family History  Problem Relation Age of Onset   Heart disease Mother    Heart disease Father    Alcohol abuse Father    Dystonia Sister    Dystonia Brother    Colon cancer Neg Hx    Esophageal cancer Neg Hx    Stomach cancer Neg Hx     Allergies  Allergen Reactions   Levonorgestrel-Ethinyl Estrad    Other     Seasonal allergies, horses, cats and mold    Current Outpatient Medications on File Prior to Visit  Medication Sig Dispense Refill   albuterol (VENTOLIN HFA) 108 (90 Base) MCG/ACT inhaler INHALE 1-2 PUFFS BY MOUTH EVERY 6 HOURS AS NEEDED FOR WHEEZE OR SHORTNESS OF BREATH 8.5 each 3   amphetamine-dextroamphetamine (ADDERALL) 15 MG tablet Take 1 tablet by mouth 2 (two) times daily. 60 tablet 0   amphetamine-dextroamphetamine (ADDERALL) 15 MG tablet Take 1 tablet by mouth 2 (two) times daily. 60 tablet 0   amphetamine-dextroamphetamine (ADDERALL) 15 MG tablet Take 1 tablet by mouth 2 (two) times daily. 60 tablet 0   ARNUITY ELLIPTA 100 MCG/ACT AEPB TAKE 1 PUFF BY MOUTH EVERY DAY 30 each 5   atorvastatin (LIPITOR) 20 MG tablet Take 1 tablet (20 mg total) by mouth daily. 90 tablet 1   clonazePAM (KLONOPIN) 0.5 MG tablet Take 0.5 mg by mouth at bedtime.     fluticasone (FLONASE) 50 MCG/ACT nasal spray      montelukast (SINGULAIR) 10 MG tablet Take 1 tablet (10 mg total) by mouth at bedtime. 30 tablet 11   triamcinolone cream (KENALOG) 0.1 % Apply 1 Application topically 2 (two) times daily. 30 g 0   No current facility-administered medications on file prior to visit.    BP (!) 150/80   Pulse 62   Temp (!) 97.4 F (36.3 C) (Oral)   Ht 5\' 1"  (1.549 m)   Wt 123 lb (55.8 kg)   SpO2 98%   BMI 23.24  kg/m       Objective:   Physical Exam Vitals and nursing note reviewed.  Constitutional:      Appearance: Normal appearance.  Cardiovascular:     Rate and Rhythm: Normal rate and regular rhythm.     Pulses: Normal pulses.     Heart sounds: Normal heart sounds.  Pulmonary:     Effort: Pulmonary effort is normal.  Breath sounds: Normal breath sounds.  Skin:    General: Skin is warm and dry.  Neurological:     General: No focal deficit present.     Mental Status: She is alert and oriented to person, place, and time.  Psychiatric:        Mood and Affect: Mood normal.        Behavior: Behavior normal.        Thought Content: Thought content normal.        Judgment: Judgment normal.       Assessment & Plan:  1. Attention deficit hyperactivity disorder (ADHD), unspecified ADHD type - Continue with adderall 15 mg BID.   2. Mixed hyperlipidemia - Will have her d/c lipitor.  - Recheck lipid panel and decide on statin  - Lipid panel; Future - Hepatic Function Panel; Future  Shirline Frees, NP

## 2022-06-18 NOTE — Patient Instructions (Addendum)
I am going to have you stop Lipitor   We will recheck your cholesterol panel - please schedule a lab appointment at the front desk

## 2022-06-20 ENCOUNTER — Other Ambulatory Visit (INDEPENDENT_AMBULATORY_CARE_PROVIDER_SITE_OTHER): Payer: Medicare Other

## 2022-06-20 DIAGNOSIS — E782 Mixed hyperlipidemia: Secondary | ICD-10-CM

## 2022-06-20 LAB — HEPATIC FUNCTION PANEL
ALT: 19 U/L (ref 0–35)
AST: 25 U/L (ref 0–37)
Albumin: 4.3 g/dL (ref 3.5–5.2)
Alkaline Phosphatase: 89 U/L (ref 39–117)
Bilirubin, Direct: 0.1 mg/dL (ref 0.0–0.3)
Total Bilirubin: 0.7 mg/dL (ref 0.2–1.2)
Total Protein: 6.8 g/dL (ref 6.0–8.3)

## 2022-06-20 LAB — LIPID PANEL
Cholesterol: 176 mg/dL (ref 0–200)
HDL: 68.9 mg/dL (ref >39.00)
LDL Cholesterol: 93 mg/dL (ref 0–99)
NonHDL: 106.72
Total CHOL/HDL Ratio: 3
Triglycerides: 71 mg/dL (ref 0.0–149.0)
VLDL: 14.2 mg/dL (ref 0.0–40.0)

## 2022-06-21 ENCOUNTER — Other Ambulatory Visit: Payer: Self-pay | Admitting: Adult Health

## 2022-06-21 MED ORDER — SIMVASTATIN 10 MG PO TABS: 10.0000 mg | ORAL_TABLET | Freq: Every day | ORAL | 0 refills | Status: DC

## 2022-07-12 ENCOUNTER — Telehealth: Payer: Self-pay | Admitting: Adult Health

## 2022-07-12 ENCOUNTER — Other Ambulatory Visit: Payer: Self-pay | Admitting: Adult Health

## 2022-07-12 DIAGNOSIS — R21 Rash and other nonspecific skin eruption: Secondary | ICD-10-CM

## 2022-07-12 NOTE — Telephone Encounter (Signed)
Prescription Request  07/12/2022  LOV: 06/18/2022  What is the name of the medication or equipment? amphetamine-dextroamphetamine Expired - amphetamine-dextroamphetamine (ADDERALL) 15 MG tablet  Have you contacted your pharmacy to request a refill? No   Which pharmacy would you like this sent to?  [id number] - [place], [place] - [address] [place] [address] Phone: [phone] Fax: [phone]    Patient notified that their request is being sent to the clinical staff for review and that they should receive a response within 2 business days.   Please advise at Mobile [phone] (mobile)

## 2022-07-12 NOTE — Telephone Encounter (Signed)
Look like previous Rx expired. Ok to fill?

## 2022-08-15 ENCOUNTER — Telehealth: Payer: Self-pay | Admitting: Adult Health

## 2022-08-15 ENCOUNTER — Other Ambulatory Visit: Payer: Self-pay | Admitting: Adult Health

## 2022-08-15 DIAGNOSIS — F909 Attention-deficit hyperactivity disorder, unspecified type: Secondary | ICD-10-CM

## 2022-08-15 MED ORDER — AMPHETAMINE-DEXTROAMPHETAMINE 15 MG PO TABS
15.0000 mg | ORAL_TABLET | Freq: Two times a day (BID) | ORAL | 0 refills | Status: DC
Start: 2022-08-15 — End: 2022-10-23

## 2022-08-15 NOTE — Telephone Encounter (Signed)
Patient notified of update  and verbalized understanding. 

## 2022-08-15 NOTE — Telephone Encounter (Signed)
Noted  

## 2022-08-15 NOTE — Telephone Encounter (Signed)
Prescription Request  08/15/2022  LOV: 06/18/2022  What is the name of the medication or equipment? amphetamine-dextroamphetamine Expired - amphetamine-dextroamphetamine (ADDERALL) 15 MG tablet  Have you contacted your pharmacy to request a refill? Yes   Which pharmacy would you like this sent to?  [id number] - [place], [place] - [address] [place] [address] Phone: [phone] Fax: [phone]    Patient notified that their request is being sent to the clinical staff for review and that they should receive a response within 2 business days.   Please advise at Mobile [phone] (mobile)

## 2022-08-15 NOTE — Telephone Encounter (Signed)
Okay for refill?  

## 2022-09-12 ENCOUNTER — Other Ambulatory Visit: Payer: Self-pay | Admitting: Internal Medicine

## 2022-09-12 ENCOUNTER — Other Ambulatory Visit: Payer: Self-pay | Admitting: Adult Health

## 2022-10-22 ENCOUNTER — Other Ambulatory Visit: Payer: Self-pay | Admitting: Internal Medicine

## 2022-10-22 ENCOUNTER — Telehealth: Payer: Self-pay | Admitting: Adult Health

## 2022-10-22 DIAGNOSIS — F909 Attention-deficit hyperactivity disorder, unspecified type: Secondary | ICD-10-CM

## 2022-10-22 NOTE — Telephone Encounter (Signed)
Okay for refill? Please advise

## 2022-10-22 NOTE — Telephone Encounter (Signed)
Prescription Request  10/22/2022  LOV: 06/18/2022  What is the name of the medication or equipment? amphetamine-dextroamphetamine Expired - amphetamine-dextroamphetamine (ADDERALL) 15 MG tablet  *Pt called to say she only has 1 or 2 days left of this Rx.  Have you contacted your pharmacy to request a refill? Yes   Which pharmacy would you like this sent to?  [id number] - [place], [place] - [address] [place] [address] Phone: [phone] Fax: [phone]    Patient notified that their request is being sent to the clinical staff for review and that they should receive a response within 2 business days.   Please advise at Mobile [phone] (mobile)

## 2022-10-23 MED ORDER — AMPHETAMINE-DEXTROAMPHETAMINE 15 MG PO TABS
15.0000 mg | ORAL_TABLET | Freq: Two times a day (BID) | ORAL | 0 refills | Status: DC
Start: 2022-10-23 — End: 2022-11-28

## 2022-10-31 ENCOUNTER — Emergency Department (HOSPITAL_BASED_OUTPATIENT_CLINIC_OR_DEPARTMENT_OTHER): Payer: Medicare Other | Admitting: Radiology

## 2022-10-31 ENCOUNTER — Other Ambulatory Visit: Payer: Self-pay

## 2022-10-31 ENCOUNTER — Encounter (HOSPITAL_BASED_OUTPATIENT_CLINIC_OR_DEPARTMENT_OTHER): Payer: Self-pay | Admitting: Emergency Medicine

## 2022-10-31 ENCOUNTER — Emergency Department (HOSPITAL_BASED_OUTPATIENT_CLINIC_OR_DEPARTMENT_OTHER): Payer: Medicare Other

## 2022-10-31 ENCOUNTER — Emergency Department (HOSPITAL_BASED_OUTPATIENT_CLINIC_OR_DEPARTMENT_OTHER)
Admission: EM | Admit: 2022-10-31 | Discharge: 2022-10-31 | Disposition: A | Discharge status: Home / Self Care | Payer: Medicare Other | Attending: Emergency Medicine | Admitting: Emergency Medicine

## 2022-10-31 DIAGNOSIS — S8001XA Contusion of right knee, initial encounter: Secondary | ICD-10-CM | POA: Diagnosis not present

## 2022-10-31 DIAGNOSIS — S4991XA Unspecified injury of right shoulder and upper arm, initial encounter: Secondary | ICD-10-CM | POA: Diagnosis present

## 2022-10-31 DIAGNOSIS — Q048 Other specified congenital malformations of brain: Secondary | ICD-10-CM | POA: Diagnosis not present

## 2022-10-31 DIAGNOSIS — S46911A Strain of unspecified muscle, fascia and tendon at shoulder and upper arm level, right arm, initial encounter: Secondary | ICD-10-CM | POA: Diagnosis not present

## 2022-10-31 DIAGNOSIS — S5001XA Contusion of right elbow, initial encounter: Secondary | ICD-10-CM | POA: Insufficient documentation

## 2022-10-31 DIAGNOSIS — S0083XA Contusion of other part of head, initial encounter: Secondary | ICD-10-CM | POA: Diagnosis not present

## 2022-10-31 DIAGNOSIS — Y9301 Activity, walking, marching and hiking: Secondary | ICD-10-CM | POA: Diagnosis not present

## 2022-10-31 DIAGNOSIS — S0990XA Unspecified injury of head, initial encounter: Secondary | ICD-10-CM

## 2022-10-31 DIAGNOSIS — W19XXXA Unspecified fall, initial encounter: Secondary | ICD-10-CM

## 2022-10-31 DIAGNOSIS — G9389 Other specified disorders of brain: Secondary | ICD-10-CM

## 2022-10-31 DIAGNOSIS — W1781XA Fall down embankment (hill), initial encounter: Secondary | ICD-10-CM | POA: Insufficient documentation

## 2022-10-31 NOTE — ED Notes (Signed)
Reviewed discharge instructions with pt. Pt verbalized understanding and had no further questions. Pt exited ED without complications.

## 2022-10-31 NOTE — ED Provider Notes (Signed)
[facility] EMERGENCY DEPARTMENT AT [facility] Provider Note   CSN: [id number] Arrival date & time: 10/31/22  1621     History  Chief Complaint  Patient presents with   Marletta Lor    Melissa Cervantes is a 82 y.o. female.  She is fairly healthy.  She said she had a mechanical fall yesterday while walking down a slight decline.  Hit her head injured her arm and her right knee.  No loss of consciousness.  She called her neurologist and they recommended she come here for further evaluation.  She said this is her third fall and she is worried this may be related to some Parkinson's-like symptoms.  She denies any headache but does feel little dizzy.  No blurry vision double vision numbness or weakness.  No chest pain abdominal pain neck or back pain.  She is not on any blood thinners.  The history is provided by the patient.  Fall This is a new problem. The current episode started yesterday. Associated symptoms include headaches. Pertinent negatives include no chest pain, no abdominal pain and no shortness of breath. Nothing aggravates the symptoms. Nothing relieves the symptoms. She has tried rest for the symptoms. The treatment provided mild relief.       Home Medications Prior to Admission medications   Medication Sig Start Date End Date Taking? Authorizing Provider  albuterol (VENTOLIN HFA) 108 (90 Base) MCG/ACT inhaler INHALE 1-2 PUFFS BY MOUTH EVERY 6 HOURS AS NEEDED FOR WHEEZE OR SHORTNESS OF BREATH 10/02/21   Nafziger, Kandee Keen, NP  amphetamine-dextroamphetamine (ADDERALL) 15 MG tablet Take 1 tablet by mouth 2 (two) times daily. 10/23/22 11/22/22  Nafziger, Kandee Keen, NP  amphetamine-dextroamphetamine (ADDERALL) 15 MG tablet Take 1 tablet by mouth 2 (two) times daily. 10/23/22 11/22/22  Nafziger, Kandee Keen, NP  amphetamine-dextroamphetamine (ADDERALL) 15 MG tablet Take 1 tablet by mouth 2 (two) times daily. 10/23/22 11/22/22  Nafziger, Kandee Keen, NP  ARNUITY ELLIPTA 100 MCG/ACT AEPB TAKE 1 PUFF BY MOUTH  EVERY DAY 05/08/22   Charlott Holler, MD  clonazePAM (KLONOPIN) 0.5 MG tablet Take 0.5 mg by mouth at bedtime.    [provider]  fluticasone Aleda Grana) 50 MCG/ACT nasal spray  09/19/21   [provider]  montelukast (SINGULAIR) 10 MG tablet TAKE 1 TABLET BY MOUTH EVERYDAY AT BEDTIME 10/23/22   Charlott Holler, MD  simvastatin (ZOCOR) 10 MG tablet TAKE 1 TABLET BY MOUTH EVERYDAY AT BEDTIME 09/12/22   Nafziger, Kandee Keen, NP  triamcinolone cream (KENALOG) 0.1 % APPLY TO AFFECTED AREA TWICE A DAY 07/12/22   Nafziger, Kandee Keen, NP      Allergies    Levonorgestrel-ethinyl estrad, Lipitor [atorvastatin], and Other    Review of Systems   Review of Systems  Eyes:  Negative for visual disturbance.  Respiratory:  Negative for shortness of breath.   Cardiovascular:  Negative for chest pain.  Gastrointestinal:  Negative for abdominal pain.  Genitourinary:  Negative for dysuria.  Musculoskeletal:  Negative for back pain and neck pain.  Neurological:  Positive for headaches. Negative for speech difficulty, weakness and numbness.    Physical Exam Updated Vital Signs BP (!) 145/71   Pulse 70   Temp 97.8 F (36.6 C) (Oral)   Resp 20   SpO2 100%  Physical Exam Vitals and nursing note reviewed.  Constitutional:      General: She is not in acute distress.    Appearance: Normal appearance. She is well-developed.  HENT:     Head: Normocephalic.  Comments: She has some bruising of her right forehead and a little bit of ecchymosis is settling above and below her eye on the right.  Eye itself is normal with a clear anterior chamber normal extraocular movements.  No gross visual difficulty. Eyes:     Conjunctiva/sclera: Conjunctivae normal.  Cardiovascular:     Rate and Rhythm: Normal rate and regular rhythm.     Heart sounds: No murmur heard. Pulmonary:     Effort: Pulmonary effort is normal. No respiratory distress.     Breath sounds: Normal breath sounds.  Abdominal:     Palpations:  Abdomen is soft.     Tenderness: There is no abdominal tenderness. There is no guarding or rebound.  Musculoskeletal:        General: Tenderness present. No deformity. Normal range of motion.     Cervical back: Neck supple. No tenderness.     Comments: She is get some tenderness around her right shoulder.  There is a little bit of bruising around her right elbow and she has some tenderness of her right wrist with range of motion.  She also has some bruises over her right knee and a tender patella.  Full range of motion.  Other extremities full range of motion without any pain or limitations.  Skin:    General: Skin is warm and dry.     Capillary Refill: Capillary refill takes less than 2 seconds.  Neurological:     General: No focal deficit present.     Mental Status: She is alert and oriented to person, place, and time.     Sensory: No sensory deficit.     Motor: No weakness.     ED Results / Procedures / Treatments   Labs (all labs ordered are listed, but only abnormal results are displayed) Labs Reviewed - No data to display  EKG None  Radiology DG Knee Complete 4 Views Right  Result Date: 10/31/2022 CLINICAL DATA:  Bruising and pain EXAM: RIGHT KNEE - COMPLETE 4+ VIEW COMPARISON:  None Available. FINDINGS: No fracture or malalignment. 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 Moderate patellofemoral degenerative change. Small knee effusion IMPRESSION: 1. No acute osseous abnormality. 2. 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 3. Small knee effusion. Electronically Signed   By: Jasmine Pang M.D.   On: 10/31/2022 20:07   DG Shoulder Right  Result Date: 10/31/2022 CLINICAL DATA:  Fall with pain EXAM: RIGHT SHOULDER - 2+ VIEW COMPARISON:  None Available. FINDINGS: No fracture or malalignment. Moderate AC joint and glenohumeral degenerative change. The right apex is clear IMPRESSION: No acute osseous abnormality. Electronically Signed   By:  Jasmine Pang M.D.   On: 10/31/2022 20:06   CT Head Wo Contrast  Result Date: 10/31/2022 CLINICAL DATA:  Head trauma, minor (Age >= 65y); Neck trauma (Age >= 65y). Fall with head strike. EXAM: CT HEAD WITHOUT CONTRAST CT CERVICAL SPINE WITHOUT CONTRAST TECHNIQUE: Multidetector CT imaging of the head and cervical spine was performed following the standard protocol without intravenous contrast. Multiplanar CT image reconstructions of the cervical spine were also generated. RADIATION DOSE REDUCTION: This exam was performed according to the departmental dose-optimization program which includes automated exposure control, adjustment of the mA and/or kV according to patient size and/or use of iterative reconstruction technique. COMPARISON:  CT head and cervical spine 01/02/2021. FINDINGS: CT HEAD FINDINGS Brain: No acute intracranial hemorrhage. Stable mild chronic small-vessel disease. Cortical gray-white differentiation is preserved. Disproportionate prominence of the ventricles relative to  the sulci with acute callosal angle and elevated Evans index. No extra-axial collection. No mass effect or midline shift. Vascular: No hyperdense vessel or unexpected calcification. Skull: No calvarial fracture or suspicious bone lesion. Skull base is unremarkable. Sinuses/Orbits: Moderate mucosal disease in the imaged paranasal sinuses. Orbits are unremarkable. Other: None. CT CERVICAL SPINE FINDINGS Alignment: Normal. Skull base and vertebrae: No acute fracture. Normal craniocervical junction. No suspicious bone lesions. Soft tissues and spinal canal: No prevertebral fluid or swelling. No visible canal hematoma. Disc levels: Mild cervical spondylosis without high-grade spinal canal stenosis. Upper chest: No acute findings. Other: None. IMPRESSION: 1. No acute intracranial abnormality. 2. Disproportionate prominence of the ventricles relative to the sulci with acute callosal angle and elevated Evans index, which can be seen in  the setting of normal pressure hydrocephalus. 3. No acute cervical spine fracture or traumatic listhesis. Electronically Signed   By: Orvan Falconer M.D.   On: 10/31/2022 19:35   CT Cervical Spine Wo Contrast  Result Date: 10/31/2022 CLINICAL DATA:  Head trauma, minor (Age >= 65y); Neck trauma (Age >= 65y). Fall with head strike. EXAM: CT HEAD WITHOUT CONTRAST CT CERVICAL SPINE WITHOUT CONTRAST TECHNIQUE: Multidetector CT imaging of the head and cervical spine was performed following the standard protocol without intravenous contrast. Multiplanar CT image reconstructions of the cervical spine were also generated. RADIATION DOSE REDUCTION: This exam was performed according to the departmental dose-optimization program which includes automated exposure control, adjustment of the mA and/or kV according to patient size and/or use of iterative reconstruction technique. COMPARISON:  CT head and cervical spine 01/02/2021. FINDINGS: CT HEAD FINDINGS Brain: No acute intracranial hemorrhage. Stable mild chronic small-vessel disease. Cortical gray-white differentiation is preserved. Disproportionate prominence of the ventricles relative to the sulci with acute callosal angle and elevated Evans index. No extra-axial collection. No mass effect or midline shift. Vascular: No hyperdense vessel or unexpected calcification. Skull: No calvarial fracture or suspicious bone lesion. Skull base is unremarkable. Sinuses/Orbits: Moderate mucosal disease in the imaged paranasal sinuses. Orbits are unremarkable. Other: None. CT CERVICAL SPINE FINDINGS Alignment: Normal. Skull base and vertebrae: No acute fracture. Normal craniocervical junction. No suspicious bone lesions. Soft tissues and spinal canal: No prevertebral fluid or swelling. No visible canal hematoma. Disc levels: Mild cervical spondylosis without high-grade spinal canal stenosis. Upper chest: No acute findings. Other: None. IMPRESSION: 1. No acute intracranial abnormality.  2. Disproportionate prominence of the ventricles relative to the sulci with acute callosal angle and elevated Evans index, which can be seen in the setting of normal pressure hydrocephalus. 3. No acute cervical spine fracture or traumatic listhesis. Electronically Signed   By: Orvan Falconer M.D.   On: 10/31/2022 19:35    Procedures Procedures    Medications Ordered in ED Medications - No data to display  ED Course/ Medical Decision Making/ A&P Clinical Course as of 11/01/22 1005  Thu Oct 31, 2022  1946 X-rays of right shoulder and right knee did not show any acute fracture or dislocation.  Head CT and cervical spine CT showed large ventricles no acute bleed or mass no fracture [MB]  2015 Viewed results with patient.  She has a neurologist to follow-up with.  Return instructions discussed [MB]    Clinical Course User Index [MB] Terrilee Files, MD                                 Medical Decision Making Amount and/or  Complexity of Data Reviewed Radiology: ordered.   This patient complains of fall head injury pain and right shoulder right knee; this involves an extensive number of treatment Options and is a complaint that carries with it a high risk of complications and morbidity. The differential includes contusion, hematoma, fracture, intracranial bleed I ordered imaging studies which included CT head and C-spine, x-rays right shoulder right knee and I independently    visualized and interpreted imaging which showed no acute traumatic findings other than hematoma.  Does have enlarged ventricles. Previous records obtained and reviewed in epic including recent neurology and PCP notes Cardiac monitoring reviewed, sinus rhythm Social determinants considered, socially isolated Critical Interventions: None  After the interventions stated above, I reevaluated the patient and found patient to be resting quietly reading a book Admission and further testing considered, no indications for  admission or further workup at this time.  Recommended close follow-up with PCP and neurology.  Return instructions discussed.         Final Clinical Impression(s) / ED Diagnoses Final diagnoses:  Fall, initial encounter  Acute head trauma, initial encounter  Contusion of forehead, initial encounter  Strain of right shoulder, initial encounter  Contusion of right knee, initial encounter  Cerebral ventriculomegaly    Rx / DC Orders ED Discharge Orders     None         Terrilee Files, MD 11/01/22 1007

## 2022-10-31 NOTE — ED Triage Notes (Signed)
Fall Yesterday  Was walking down a slight hill and fell, Hit right side of head (bruising around right eye and forehead), right arm pains (bruising), right knee pain (abrasions) No loc, no blood thinner

## 2022-10-31 NOTE — Discharge Instructions (Signed)
You were seen in the emergency department for evaluation of injuries from a fall.  You had a CAT scan of your head and neck along with x-rays of your shoulder and knee on the right.  There were no traumatic findings.  The CAT scan of your brain did show enlarged ventricles and this will need follow-up with your neurologist.  Return if any worsening or concerning symptoms

## 2022-11-25 ENCOUNTER — Other Ambulatory Visit: Payer: Self-pay | Admitting: Adult Health

## 2022-11-27 ENCOUNTER — Telehealth: Payer: Self-pay | Admitting: Adult Health

## 2022-11-27 NOTE — Telephone Encounter (Signed)
Prescription Request  11/27/2022  LOV: 06/18/2022  What is the name of the medication or equipment? amphetamine-dextroamphetamine (ADDERALL) 15 MG tablet   Have you contacted your pharmacy to request a refill? No   Which pharmacy would you like this sent to?  [id number] - [place], [place] - [address] [place] [address] Phone: [phone] Fax: [phone]    Patient notified that their request is being sent to the clinical staff for review and that they should receive a response within 2 business days.   Please advise at Mobile [phone] (mobile)

## 2022-11-27 NOTE — Telephone Encounter (Signed)
Ok to fill. Looks like the other ones are expired.

## 2022-11-28 ENCOUNTER — Other Ambulatory Visit: Payer: Self-pay | Admitting: Adult Health

## 2022-11-28 DIAGNOSIS — F909 Attention-deficit hyperactivity disorder, unspecified type: Secondary | ICD-10-CM

## 2022-11-28 MED ORDER — AMPHETAMINE-DEXTROAMPHETAMINE 15 MG PO TABS: 15.0000 mg | ORAL_TABLET | Freq: Two times a day (BID) | ORAL | 0 refills | Status: DC

## 2022-12-04 ENCOUNTER — Telehealth: Payer: Self-pay

## 2022-12-04 NOTE — Telephone Encounter (Signed)
Transition Care Management Follow-up Telephone Call Date of discharge and from where: 10/31/2022 [facility] How have you been since you were released from the hospital? Patient stated she is not feeling any better. She is still experiencing balance issues. Any questions or concerns? No  Items Reviewed: Did the pt receive and understand the discharge instructions provided? Yes  Medications obtained and verified?  No medication prescribed. Other? No  Any new allergies since your discharge? No  Dietary orders reviewed? Yes Do you have support at home? Yes   Follow up appointments reviewed:  PCP Hospital f/u appt confirmed? No  Scheduled to see  on  @ . Specialist Hospital f/u appt confirmed? Yes  Scheduled to see Deepal P. Baron Sane, MD on 11/12/2022 @ [facility]. Are transportation arrangements needed? No  If their condition worsens, is the pt aware to call PCP or go to the Emergency Dept.? Yes Was the patient provided with contact information for the PCP's office or ED? Yes Was to pt encouraged to call back with questions or concerns? Yes   [facility]  [facility], [facility] Guide Direct Dial: [phone]  Website: [email or web address]

## 2022-12-09 DIAGNOSIS — R260 Ataxic gait: Secondary | ICD-10-CM | POA: Insufficient documentation

## 2022-12-09 DIAGNOSIS — G9389 Other specified disorders of brain: Secondary | ICD-10-CM | POA: Insufficient documentation

## 2022-12-19 ENCOUNTER — Telehealth: Payer: Self-pay | Admitting: Adult Health

## 2022-12-19 NOTE — Telephone Encounter (Signed)
Okay for refill?  

## 2022-12-19 NOTE — Telephone Encounter (Signed)
Prescription Request  12/19/2022  LOV: 06/18/2022  What is the name of the medication or equipment? amphetamine-dextroamphetamine (ADDERALL) 15 MG tablet   Have you contacted your pharmacy to request a refill? No   Which pharmacy would you like this sent to?  [id number] - [place], [place] - [address] [address] Phone: [phone] Fax: [phone]    Patient notified that their request is being sent to the clinical staff for review and that they should receive a response within 2 business days.   Please advise at Mobile [phone] (mobile)

## 2022-12-20 ENCOUNTER — Other Ambulatory Visit: Payer: Self-pay | Admitting: Adult Health

## 2022-12-20 NOTE — Telephone Encounter (Signed)
Noted  

## 2022-12-20 NOTE — Telephone Encounter (Signed)
Left detailed message on pt answering machine advising of refill.

## 2023-01-22 ENCOUNTER — Other Ambulatory Visit: Payer: Self-pay | Admitting: Adult Health

## 2023-01-24 DIAGNOSIS — G912 (Idiopathic) normal pressure hydrocephalus: Secondary | ICD-10-CM | POA: Insufficient documentation

## 2023-01-31 ENCOUNTER — Telehealth: Payer: Self-pay | Admitting: Adult Health

## 2023-01-31 NOTE — Progress Notes (Unsigned)
Called Pt at 12:02 pm. Pt stated she did not want this appointment if it was not with NP. Pt cancelled this appt, and did not want to reschedule at this time.

## 2023-01-31 NOTE — Telephone Encounter (Signed)
ERROR PLEASE DISREGARD

## 2023-02-13 ENCOUNTER — Ambulatory Visit (INDEPENDENT_AMBULATORY_CARE_PROVIDER_SITE_OTHER): Payer: Medicare Other | Admitting: Adult Health

## 2023-02-13 ENCOUNTER — Encounter: Payer: Self-pay | Admitting: Adult Health

## 2023-02-13 VITALS — BP 140/90 | HR 101 | Temp 97.7°F | Ht 61.0 in | Wt 120.0 lb

## 2023-02-13 DIAGNOSIS — I10 Essential (primary) hypertension: Secondary | ICD-10-CM

## 2023-02-13 DIAGNOSIS — G912 (Idiopathic) normal pressure hydrocephalus: Secondary | ICD-10-CM

## 2023-02-13 MED ORDER — LISINOPRIL 2.5 MG PO TABS: 2.5000 mg | ORAL_TABLET | Freq: Every day | ORAL | 0 refills | Status: DC

## 2023-02-13 NOTE — Progress Notes (Signed)
 Subjective:    Patient ID: Melissa Cervantes, female    DOB: 08/01/1940, 83 y.o.   MRN: [id number]  HPI 83 year old female who  has a past medical history of Achalasia, ADHD, Allergy, Cervical dystonia, Chicken pox, Esophageal obstruction, Hearing loss, Meniere disease, Osteoarthritis, and Spinal stenosis.  She presents to the office today for multiple issues.  She has been seen neurosurgery at [facility] for being diagnosed with normal pressure hydrocephalus.  She had a large-volume LP significant improvement in her symptoms after the LP.  She had increased speed of her gait and her balance was improved.  Thought she would be a reasonable candidate for shunt placement but she is nervous about the surgery and would like a second opinion through [facility].  He was also noticed that her blood pressure was elevated into the 166/66 range on January 24, 2023 while at the neurosurgeons office.  She has been monitoring her blood pressure at home with most readings in the 140s over 90s.  She was encouraged to get her blood pressure down to the 120/80 range.     Review of Systems See HPI   Past Medical History:  Diagnosis Date   Achalasia    ADHD    Allergy    Cervical dystonia    Chicken pox    Esophageal obstruction    per patient she is unable to eat solid food   Hearing loss    Meniere disease    Osteoarthritis    Spinal stenosis     Social History   Socioeconomic History   Marital status: Divorced    Spouse name: Not on file   Number of children: Not on file   Years of education: Not on file   Highest education level: Master's degree (e.g., MA, MS, MEng, MEd, MSW, MBA)  Occupational History   Not on file  Tobacco Use   Smoking status: Former   Smokeless tobacco: Never  Vaping Use   Vaping status: Never Used  Substance and Sexual Activity   Alcohol use: Yes    Comment: 4   Drug use: Not Currently    Types: Marijuana   Sexual activity: Not Currently  Other Topics  Concern   Not on file  Social History Narrative   Not on file   Social Drivers of Health   Financial Resource Strain: Low Risk  (05/24/2022)   Overall Financial Resource Strain (CARDIA)    Difficulty of Paying Living Expenses: Not hard at all  Food Insecurity: No Food Insecurity (05/24/2022)   Hunger Vital Sign    Worried About Running Out of Food in the Last Year: Never true    Ran Out of Food in the Last Year: Never true  Transportation Needs: No Transportation Needs (05/24/2022)   PRAPARE - Administrator, Civil Service (Medical): No    Lack of Transportation (Non-Medical): No  Physical Activity: Sufficiently Active (05/24/2022)   Exercise Vital Sign    Days of Exercise per Week: 4 days    Minutes of Exercise per Session: 60 min  Stress: No Stress Concern Present (05/24/2022)   [employer] of Occupational Health - Occupational Stress Questionnaire    Feeling of Stress : Not at all  Social Connections: Socially Isolated (05/24/2022)   Social Connection and Isolation Panel [NHANES]    Frequency of Communication with Friends and Family: More than three times a week    Frequency of Social Gatherings with Friends and Family: More than three times  a week    Attends Religious Services: Never    Active Member of Clubs or Organizations: No    Attends Banker Meetings: Never    Marital Status: Divorced  Catering Manager Violence: Not At Risk (05/24/2022)   Humiliation, Afraid, Rape, and Kick questionnaire    Fear of Current or Ex-Partner: No    Emotionally Abused: No    Physically Abused: No    Sexually Abused: No    Past Surgical History:  Procedure Laterality Date   BALLOON DILATION N/A 01/30/2021   Procedure: BALLOON DILATION;  Surgeon: Shila Gustav GAILS, MD;  Location: [facility];  Service: Endoscopy;  Laterality: N/A;   CESAREAN SECTION  1971, 1979   CYST EXCISION     tailbone   ESOPHAGEAL BRUSHING  01/30/2021   Procedure: ESOPHAGEAL BRUSHING;   Surgeon: Shila Gustav GAILS, MD;  Location: [facility];  Service: Endoscopy;;   ESOPHAGOGASTRODUODENOSCOPY (EGD) WITH PROPOFOL  N/A 01/30/2021   Procedure: ESOPHAGOGASTRODUODENOSCOPY (EGD) WITH PROPOFOL ;  Surgeon: Shila Gustav GAILS, MD;  Location: [facility];  Service: Endoscopy;  Laterality: N/A;   HELLER MYOTOMY  08/2005   NISSEN FUNDOPLICATION  2007   TOTAL HIP ARTHROPLASTY Left 06/2012    Family History  Problem Relation Age of Onset   Heart disease Mother    Heart disease Father    Alcohol abuse Father    Dystonia Sister    Dystonia Brother    Colon cancer Neg Hx    Esophageal cancer Neg Hx    Stomach cancer Neg Hx     Allergies  Allergen Reactions   Levonorgestrel-Ethinyl Estrad    Lipitor [Atorvastatin ] Other (See Comments)    Fatigue and muscle aches    Other     Seasonal allergies, horses, cats and mold    Current Outpatient Medications on File Prior to Visit  Medication Sig Dispense Refill   albuterol  (VENTOLIN  HFA) 108 (90 Base) MCG/ACT inhaler INHALE 1-2 PUFFS BY MOUTH EVERY 6 HOURS AS NEEDED FOR WHEEZE OR SHORTNESS OF BREATH 8.5 each 3   amphetamine -dextroamphetamine  (ADDERALL) 15 MG tablet Take 1 tablet by mouth 2 (two) times daily. 60 tablet 0   amphetamine -dextroamphetamine  (ADDERALL) 15 MG tablet Take 1 tablet by mouth 2 (two) times daily. 60 tablet 0   amphetamine -dextroamphetamine  (ADDERALL) 15 MG tablet Take 1 tablet by mouth 2 (two) times daily. 60 tablet 0   ARNUITY ELLIPTA  100 MCG/ACT AEPB TAKE 1 PUFF BY MOUTH EVERY DAY 30 each 5   clonazePAM  (KLONOPIN ) 0.5 MG tablet Take 0.5 mg by mouth at bedtime.     fluticasone  (FLONASE ) 50 MCG/ACT nasal spray      montelukast  (SINGULAIR ) 10 MG tablet TAKE 1 TABLET BY MOUTH EVERYDAY AT BEDTIME 90 tablet 0   simvastatin  (ZOCOR ) 10 MG tablet TAKE 1 TABLET BY MOUTH EVERYDAY AT BEDTIME 90 tablet 0   triamcinolone  cream (KENALOG ) 0.1 % APPLY TO AFFECTED AREA TWICE A DAY 30 g 0   No current facility-administered  medications on file prior to visit.    BP (!) 140/90   Pulse (!) 101   Temp 97.7 F (36.5 C) (Oral)   Ht 5' 1 (1.549 m)   Wt 120 lb (54.4 kg)   SpO2 99%   BMI 22.67 kg/m       Objective:   Physical Exam Vitals and nursing note reviewed.  Constitutional:      Appearance: Normal appearance.  Cardiovascular:     Rate and Rhythm: Normal rate and regular rhythm.  Pulses: Normal pulses.     Heart sounds: Normal heart sounds.  Pulmonary:     Effort: Pulmonary effort is normal.     Breath sounds: Normal breath sounds.  Musculoskeletal:        General: Normal range of motion.  Skin:    General: Skin is warm and dry.  Neurological:     General: No focal deficit present.     Mental Status: She is alert and oriented to person, place, and time.  Psychiatric:        Mood and Affect: Mood normal.        Behavior: Behavior normal.        Thought Content: Thought content normal.        Judgment: Judgment normal.        Assessment & Plan:  1. Normal pressure hydrocephalus (HCC) (Primary)  - Ambulatory referral to Neurosurgery  2. Primary hypertension - BP 140/90 twice in the office today. Will start her on lisinopril  2.5 mg  - Follow up in 2 -3 weeks  - lisinopril  (ZESTRIL ) 2.5 MG tablet; Take 1 tablet (2.5 mg total) by mouth daily.  Dispense: 90 tablet; Refill: 0  Darleene Shape, NP  Time spent with patient today was 50 minutes which consisted of chart review, discussing hypertension and normal pressure hydrocephalus, work up, treatment answering questions and documentation.

## 2023-03-03 ENCOUNTER — Other Ambulatory Visit: Payer: Self-pay | Admitting: Adult Health

## 2023-03-03 DIAGNOSIS — I10 Essential (primary) hypertension: Secondary | ICD-10-CM

## 2023-03-06 ENCOUNTER — Ambulatory Visit (INDEPENDENT_AMBULATORY_CARE_PROVIDER_SITE_OTHER): Payer: Medicare Other | Admitting: Adult Health

## 2023-03-06 VITALS — BP 120/60 | Temp 97.4°F | Ht 61.0 in | Wt 120.0 lb

## 2023-03-06 DIAGNOSIS — I10 Essential (primary) hypertension: Secondary | ICD-10-CM | POA: Diagnosis not present

## 2023-03-06 NOTE — Progress Notes (Signed)
Subjective:    Patient ID: Melissa Cervantes, female    DOB: 1940-11-24, 83 y.o.   MRN: [id number]  HPI  83 year old female who  has a past medical history of Achalasia, ADHD, Allergy, Cervical dystonia, Chicken pox, Esophageal obstruction, Hearing loss, Meniere disease, Osteoarthritis, and Spinal stenosis.  She presents to the office today for 2-week follow-up regarding hypertension.  Back in December 2024 it was noted that her blood pressure was elevated at 166/66 while at the neurosurgeons office.  She started monitoring her blood pressure at home with readings in the 140s over 90s.  2 weeks ago we started her on lisinopril 2.5 mg daily in the hopes to getting her at goal of 120/80 due to history of normal pressure hydrocephalus. She plans on having a shunt placed at [facility].   She has been a close eye on her blood pressure at home and her log shows readings in the 120's/70-80's. She has not had episodes of dizziness, lightheadedness, or blurred vision.   Review of Systems See HPI   Past Medical History:  Diagnosis Date   Achalasia    ADHD    Allergy    Cervical dystonia    Chicken pox    Esophageal obstruction    per patient she is unable to eat solid food   Hearing loss    Meniere disease    Osteoarthritis    Spinal stenosis     Social History   Socioeconomic History   Marital status: Divorced    Spouse name: Not on file   Number of children: Not on file   Years of education: Not on file   Highest education level: Master's degree (e.g., MA, MS, MEng, MEd, MSW, MBA)  Occupational History   Not on file  Tobacco Use   Smoking status: Former   Smokeless tobacco: Never  Vaping Use   Vaping status: Never Used  Substance and Sexual Activity   Alcohol use: Yes    Comment: 4   Drug use: Not Currently    Types: Marijuana   Sexual activity: Not Currently  Other Topics Concern   Not on file  Social History Narrative   Not on file   Social Drivers of Health    Financial Resource Strain: Low Risk  (05/24/2022)   Overall Financial Resource Strain (CARDIA)    Difficulty of Paying Living Expenses: Not hard at all  Food Insecurity: No Food Insecurity (05/24/2022)   Hunger Vital Sign    Worried About Running Out of Food in the Last Year: Never true    Ran Out of Food in the Last Year: Never true  Transportation Needs: No Transportation Needs (05/24/2022)   PRAPARE - Administrator, Civil Service (Medical): No    Lack of Transportation (Non-Medical): No  Physical Activity: Sufficiently Active (05/24/2022)   Exercise Vital Sign    Days of Exercise per Week: 4 days    Minutes of Exercise per Session: 60 min  Stress: No Stress Concern Present (05/24/2022)   [employer] of Occupational Health - Occupational Stress Questionnaire    Feeling of Stress : Not at all  Social Connections: Socially Isolated (05/24/2022)   Social Connection and Isolation Panel [NHANES]    Frequency of Communication with Friends and Family: More than three times a week    Frequency of Social Gatherings with Friends and Family: More than three times a week    Attends Religious Services: Never    Active Member  of Clubs or Organizations: No    Attends Banker Meetings: Never    Marital Status: Divorced  Catering manager Violence: Not At Risk (05/24/2022)   Humiliation, Afraid, Rape, and Kick questionnaire    Fear of Current or Ex-Partner: No    Emotionally Abused: No    Physically Abused: No    Sexually Abused: No    Past Surgical History:  Procedure Laterality Date   BALLOON DILATION N/A 01/30/2021   Procedure: BALLOON DILATION;  Surgeon: Napoleon Form, MD;  Location: [facility];  Service: Endoscopy;  Laterality: N/A;   CESAREAN SECTION  1971, 1979   CYST EXCISION     tailbone   ESOPHAGEAL BRUSHING  01/30/2021   Procedure: ESOPHAGEAL BRUSHING;  Surgeon: Napoleon Form, MD;  Location: [facility];  Service: Endoscopy;;    ESOPHAGOGASTRODUODENOSCOPY (EGD) WITH PROPOFOL N/A 01/30/2021   Procedure: ESOPHAGOGASTRODUODENOSCOPY (EGD) WITH PROPOFOL;  Surgeon: Napoleon Form, MD;  Location: [facility];  Service: Endoscopy;  Laterality: N/A;   HELLER MYOTOMY  08/2005   NISSEN FUNDOPLICATION  2007   TOTAL HIP ARTHROPLASTY Left 06/2012    Family History  Problem Relation Age of Onset   Heart disease Mother    Heart disease Father    Alcohol abuse Father    Dystonia Sister    Dystonia Brother    Colon cancer Neg Hx    Esophageal cancer Neg Hx    Stomach cancer Neg Hx     Allergies  Allergen Reactions   Levonorgestrel-Ethinyl Estrad    Lipitor [Atorvastatin] Other (See Comments)    Fatigue and muscle aches    Other     Seasonal allergies, horses, cats and mold    Current Outpatient Medications on File Prior to Visit  Medication Sig Dispense Refill   albuterol (VENTOLIN HFA) 108 (90 Base) MCG/ACT inhaler INHALE 1-2 PUFFS BY MOUTH EVERY 6 HOURS AS NEEDED FOR WHEEZE OR SHORTNESS OF BREATH 8.5 each 3   ARNUITY ELLIPTA 100 MCG/ACT AEPB TAKE 1 PUFF BY MOUTH EVERY DAY 30 each 5   clonazePAM (KLONOPIN) 0.5 MG tablet Take 0.5 mg by mouth at bedtime.     fluticasone (FLONASE) 50 MCG/ACT nasal spray      lisinopril (ZESTRIL) 2.5 MG tablet TAKE 1 TABLET BY MOUTH EVERY DAY 90 tablet 0   montelukast (SINGULAIR) 10 MG tablet TAKE 1 TABLET BY MOUTH EVERYDAY AT BEDTIME 90 tablet 0   simvastatin (ZOCOR) 10 MG tablet TAKE 1 TABLET BY MOUTH EVERYDAY AT BEDTIME 90 tablet 0   triamcinolone cream (KENALOG) 0.1 % APPLY TO AFFECTED AREA TWICE A DAY 30 g 0   amphetamine-dextroamphetamine (ADDERALL) 15 MG tablet Take 1 tablet by mouth 2 (two) times daily. 60 tablet 0   amphetamine-dextroamphetamine (ADDERALL) 15 MG tablet Take 1 tablet by mouth 2 (two) times daily. 60 tablet 0   amphetamine-dextroamphetamine (ADDERALL) 15 MG tablet Take 1 tablet by mouth 2 (two) times daily. 60 tablet 0   No current facility-administered  medications on file prior to visit.    BP 120/60   Temp (!) 97.4 F (36.3 C) (Oral)   Ht 5\' 1"  (1.549 m)   Wt 120 lb (54.4 kg)   BMI 22.67 kg/m       Objective:   Physical Exam Vitals and nursing note reviewed.  Constitutional:      Appearance: Normal appearance.  Cardiovascular:     Rate and Rhythm: Normal rate and regular rhythm.     Pulses: Normal pulses.  Heart sounds: Normal heart sounds.  Pulmonary:     Effort: Pulmonary effort is normal.     Breath sounds: Normal breath sounds.  Musculoskeletal:        General: Normal range of motion.  Skin:    General: Skin is warm and dry.  Neurological:     General: No focal deficit present.     Mental Status: She is alert and oriented to person, place, and time.  Psychiatric:        Mood and Affect: Mood normal.        Behavior: Behavior normal.        Thought Content: Thought content normal.        Judgment: Judgment normal.       Assessment & Plan:  1. Primary hypertension (Primary) - BP at goal.  - Continue with lisinopril 2.5 mg daily  - Follow up as needed  Shirline Frees, NP

## 2023-03-31 ENCOUNTER — Ambulatory Visit (INDEPENDENT_AMBULATORY_CARE_PROVIDER_SITE_OTHER): Payer: Medicare Other

## 2023-03-31 ENCOUNTER — Other Ambulatory Visit: Payer: Self-pay

## 2023-03-31 ENCOUNTER — Ambulatory Visit (INDEPENDENT_AMBULATORY_CARE_PROVIDER_SITE_OTHER): Payer: Medicare Other | Admitting: Sports Medicine

## 2023-03-31 VITALS — HR 87 | Ht 61.0 in | Wt 119.0 lb

## 2023-03-31 DIAGNOSIS — M1611 Unilateral primary osteoarthritis, right hip: Secondary | ICD-10-CM | POA: Diagnosis not present

## 2023-03-31 DIAGNOSIS — M25551 Pain in right hip: Secondary | ICD-10-CM

## 2023-03-31 NOTE — Progress Notes (Signed)
 [facility] [address] [place] [address] Phone: [phone]   Assessment and Plan:     1. Primary osteoarthritis of right hip 2. Right hip pain -Chronic with exacerbation, initial sports medicine visit - Consistent with flare of right hip osteoarthritis based on HPI and physical exam. - X-ray obtained in clinic.  My interpretation: No acute fracture or dislocation.  Moderate to severe intra-articular right femoral acetabular degenerative changes with sclerosis, decreased joint space, spurring.  Intact hardware from total left hip replacement - Patient elected for intra-articular CSI.  Tolerated well per note below - Muse Tylenol for day-to-day pain relief - Continue physical activity as tolerated.  Recommend limiting deep flexion, internal or external rotation to prevent arthritic flares  Procedure: Ultrasound Guided Hip Acetabulofemoral Joint Injection Side: Right Diagnosis: Flare of osteoarthritis [place] Indication:  - accuracy is paramount for diagnosis - to ensure therapeutic efficacy or procedural success - to reduce procedural risk  After explaining the procedure, viable alternatives, risks, and answering any questions, consent was given verbally. The site was cleaned with chlorhexidine prep. An ultrasound transducer was placed on the anterior thigh/hip.   The acetabular joint, labrum, and femoral shaft were identified.  The neurovascular structures were identified and an approach was found specifically avoiding these structures.  A steroid injection was performed under ultrasound guidance with sterile technique using 2ml of 1% lidocaine without epinephrine and 40 mg of triamcinolone (KENALOG) 40mg /ml. This was well tolerated and resulted in  relief.  Needle was removed and dressing placed and post injection instructions were given including  a discussion of likely return of pain today after the anesthetic wears off (with the  possibility of worsened pain) until the steroid starts to work in 1-3 days.   Pt was advised to call or return to clinic if these symptoms worsen or fail to improve as anticipated.    Pertinent previous records reviewed include none  Follow Up: As needed no improvement or worsening of symptoms in 3 to 4 weeks.  Could discuss physical therapy   Subjective:   I, Melissa Cervantes, am serving as a Neurosurgeon for Doctor Richardean Sale  Chief Complaint: R hip pain   HPI:   03/31/23 Patient is a 83 year old female with hip pain. Patient states right hip pain for weeks. Anterior groin pain that feels likes its going to give way. Pain is the most in the am. Motrin for the pain and that helps when she does take it. She takes it at night and sleeps on the side. Glute pain that radiates down the leg when she wakes it feels numb . Pain when sitting .  Relevant Historical Information: Hydrocephalus, CPPD  Additional pertinent review of systems negative.   Current Outpatient Medications:    albuterol (VENTOLIN HFA) 108 (90 Base) MCG/ACT inhaler, INHALE 1-2 PUFFS BY MOUTH EVERY 6 HOURS AS NEEDED FOR WHEEZE OR SHORTNESS OF BREATH, Disp: 8.5 each, Rfl: 3   ARNUITY ELLIPTA 100 MCG/ACT AEPB, TAKE 1 PUFF BY MOUTH EVERY DAY, Disp: 30 each, Rfl: 5   clonazePAM (KLONOPIN) 0.5 MG tablet, Take 0.5 mg by mouth at bedtime., Disp: , Rfl:    fluticasone (FLONASE) 50 MCG/ACT nasal spray, , Disp: , Rfl:    lisinopril (ZESTRIL) 2.5 MG tablet, TAKE 1 TABLET BY MOUTH EVERY DAY, Disp: 90 tablet, Rfl: 0   montelukast (SINGULAIR) 10 MG tablet, TAKE 1 TABLET BY MOUTH EVERYDAY AT BEDTIME, Disp: 90 tablet, Rfl: 0  simvastatin (ZOCOR) 10 MG tablet, TAKE 1 TABLET BY MOUTH EVERYDAY AT BEDTIME, Disp: 90 tablet, Rfl: 0   triamcinolone cream (KENALOG) 0.1 %, APPLY TO AFFECTED AREA TWICE A DAY, Disp: 30 g, Rfl: 0   amphetamine-dextroamphetamine (ADDERALL) 15 MG tablet, Take 1 tablet by mouth 2 (two) times daily., Disp: 60 tablet, Rfl:  0   amphetamine-dextroamphetamine (ADDERALL) 15 MG tablet, Take 1 tablet by mouth 2 (two) times daily., Disp: 60 tablet, Rfl: 0   amphetamine-dextroamphetamine (ADDERALL) 15 MG tablet, Take 1 tablet by mouth 2 (two) times daily., Disp: 60 tablet, Rfl: 0   Objective:     Vitals:   03/31/23 1538  Pulse: 87  SpO2: 97%  Weight: 119 lb (54 kg)  Height: 5\' 1"  (1.549 m)      Body mass index is 22.48 kg/m.    Physical Exam:    General: awake, alert, and oriented no acute distress, nontoxic Skin: no suspicious lesions or rashes Neuro:sensation intact distally with no deficits, normal muscle tone, no atrophy, strength 5/5 in all tested lower ext groups Psych: normal mood and affect, speech clear   Right hip: No deformity, swelling or wasting ROM Flexion 75, ext 25, IR 30, ER 35 NTTP over the hip flexors, greater trochanter, gluteal musculature, si joint, lumbar spine Positive log roll with FROM Positive FABER Positive FADIR  Gait antalgic, favoring left leg   Electronically signed by:  [facility] 4:11 PM 03/31/23

## 2023-03-31 NOTE — Patient Instructions (Signed)
 As needed follow up if no improvement in 3 weeks  Tylenol for day to day pain relief

## 2023-04-07 ENCOUNTER — Encounter: Payer: Self-pay | Admitting: Sports Medicine

## 2023-05-06 ENCOUNTER — Ambulatory Visit (INDEPENDENT_AMBULATORY_CARE_PROVIDER_SITE_OTHER): Admitting: Sports Medicine

## 2023-05-06 VITALS — HR 68 | Ht 61.0 in | Wt 119.0 lb

## 2023-05-06 DIAGNOSIS — M1611 Unilateral primary osteoarthritis, right hip: Secondary | ICD-10-CM | POA: Diagnosis not present

## 2023-05-06 DIAGNOSIS — M25551 Pain in right hip: Secondary | ICD-10-CM

## 2023-05-06 MED ORDER — METHYLPREDNISOLONE 4 MG PO TBPK: ORAL_TABLET | ORAL | 0 refills | Status: DC

## 2023-05-06 NOTE — Patient Instructions (Signed)
 Prednisone dos pak  Hip HEP  Pt referral  If no improvement in 1-2 weeks call and ask for PRP injection

## 2023-05-06 NOTE — Progress Notes (Signed)
 Melissa Cervantes D.[name] [address] [place] [address] Phone: [phone]   Assessment and Plan:     1. Primary osteoarthritis of right hip 2. Right hip pain -Chronic with exacerbation, subsequent visit - Consistent with recurrent flare of right hip osteoarthritis.  Patient had significant, "90%", relief after intra-articular right hip CSI performed on 03/31/2023, however her pain has returned after walking significant and down stairs over the past 1 to 2 weeks - Discussed risks of repeating CSI to intra-articular hip within 3 months which would include progressing arthritis process.  Patient elected to delay injection at this time - Start prednisone Dosepak - Continue HEP and start physical therapy - Discussed PRP and repeat CSI.  Could be considered at future visit if no improvement or worsening of symptoms   Pertinent previous records reviewed include none  Follow Up: Recommend patient contact our clinic if no significant improvement in 1 to 2 weeks and ask for PRP injection to intra-articular right hip   Subjective:   I, Melissa Cervantes, am serving as a Neurosurgeon for Doctor Richardean Sale   Chief Complaint: R hip pain    HPI:    03/31/23 Patient is a 83 year old female with hip pain. Patient states right hip pain for weeks. Anterior groin pain that feels likes its going to give way. Pain is the most in the am. Motrin for the pain and that helps when she does take it. She takes it at night and sleeps on the side. Glute pain that radiates down the leg when she wakes it feels numb . Pain when sitting .  05/06/2023 Patient states that the shot only lasted a little while and the pain is back . Antalgic gait, she did do a lot more walking, decreased ROM    Relevant Historical Information: Hydrocephalus, CPPD Additional pertinent review of systems negative.   Current Outpatient Medications:    albuterol (VENTOLIN HFA) 108 (90 Base)  MCG/ACT inhaler, INHALE 1-2 PUFFS BY MOUTH EVERY 6 HOURS AS NEEDED FOR WHEEZE OR SHORTNESS OF BREATH, Disp: 8.5 each, Rfl: 3   ARNUITY ELLIPTA 100 MCG/ACT AEPB, TAKE 1 PUFF BY MOUTH EVERY DAY, Disp: 30 each, Rfl: 5   clonazePAM (KLONOPIN) 0.5 MG tablet, Take 0.5 mg by mouth at bedtime., Disp: , Rfl:    fluticasone (FLONASE) 50 MCG/ACT nasal spray, , Disp: , Rfl:    lisinopril (ZESTRIL) 2.5 MG tablet, TAKE 1 TABLET BY MOUTH EVERY DAY, Disp: 90 tablet, Rfl: 0   methylPREDNISolone (MEDROL DOSEPAK) 4 MG TBPK tablet, Take 6 tablets on day 1.  Take 5 tablets on day 2.  Take 4 tablets on day 3.  Take 3 tablets on day 4.  Take 2 tablets on day 5.  Take 1 tablet on day 6., Disp: 21 tablet, Rfl: 0   montelukast (SINGULAIR) 10 MG tablet, TAKE 1 TABLET BY MOUTH EVERYDAY AT BEDTIME, Disp: 90 tablet, Rfl: 0   simvastatin (ZOCOR) 10 MG tablet, TAKE 1 TABLET BY MOUTH EVERYDAY AT BEDTIME, Disp: 90 tablet, Rfl: 0   triamcinolone cream (KENALOG) 0.1 %, APPLY TO AFFECTED AREA TWICE A DAY, Disp: 30 g, Rfl: 0   amphetamine-dextroamphetamine (ADDERALL) 15 MG tablet, Take 1 tablet by mouth 2 (two) times daily., Disp: 60 tablet, Rfl: 0   amphetamine-dextroamphetamine (ADDERALL) 15 MG tablet, Take 1 tablet by mouth 2 (two) times daily., Disp: 60 tablet, Rfl: 0   amphetamine-dextroamphetamine (ADDERALL) 15 MG tablet, Take 1 tablet by mouth  2 (two) times daily., Disp: 60 tablet, Rfl: 0   Objective:     Vitals:   05/06/23 1053  Pulse: 68  SpO2: 97%  Weight: 119 lb (54 kg)  Height: 5\' 1"  (1.549 m)      Body mass index is 22.48 kg/m.    Physical Exam:    General: awake, alert, and oriented no acute distress, nontoxic Skin: no suspicious lesions or rashes Neuro:sensation intact distally with no deficits, normal muscle tone, no atrophy, strength 5/5 in all tested lower ext groups Psych: normal mood and affect, speech clear   Right hip: No deformity, swelling or wasting ROM Flexion 75, ext 25, IR 30, ER 35 NTTP  over the hip flexors, greater trochanter, gluteal musculature, si joint, lumbar spine Positive log roll with FROM Positive FABER Positive FADIR  Gait antalgic, favoring left leg      Electronically signed by:  Melissa Cervantes D.[name] 12:16 PM 05/06/23

## 2023-05-06 NOTE — Therapy (Signed)
 OUTPATIENT PHYSICAL THERAPY EVALUATION   Patient Name: Melissa Cervantes MRN: [id number] DOB:[birth year], 83 y.o., female Today's Date: 05/07/2023   END OF SESSION:  PT End of Session - 05/07/23 1253     Visit Number 1    Number of Visits 17    Date for PT Re-Evaluation 07/02/23    Authorization Type MCR    Progress Note Due on Visit 10    PT Start Time 1255    PT Stop Time 1350    PT Time Calculation (min) 55 min    Activity Tolerance Patient tolerated treatment well    Behavior During Therapy WFL for tasks assessed/performed             Past Medical History:  Diagnosis Date   Achalasia    ADHD    Allergy    Cervical dystonia    Chicken pox    Esophageal obstruction    per patient she is unable to eat solid food   Hearing loss    Meniere disease    Osteoarthritis    Spinal stenosis    Past Surgical History:  Procedure Laterality Date   BALLOON DILATION N/A 01/30/2021   Procedure: BALLOON DILATION;  Surgeon: Napoleon Form, MD;  Location: [facility];  Service: Endoscopy;  Laterality: N/A;   CESAREAN SECTION  1971, 1979   CYST EXCISION     tailbone   ESOPHAGEAL BRUSHING  01/30/2021   Procedure: ESOPHAGEAL BRUSHING;  Surgeon: Napoleon Form, MD;  Location: [facility];  Service: Endoscopy;;   ESOPHAGOGASTRODUODENOSCOPY (EGD) WITH PROPOFOL N/A 01/30/2021   Procedure: ESOPHAGOGASTRODUODENOSCOPY (EGD) WITH PROPOFOL;  Surgeon: Napoleon Form, MD;  Location: [facility];  Service: Endoscopy;  Laterality: N/A;   HELLER MYOTOMY  08/2005   NISSEN FUNDOPLICATION  2007   TOTAL HIP ARTHROPLASTY Left 06/2012   Patient Active Problem List   Diagnosis Date Noted   Dysphagia    Spinal stenosis 06/09/2020   Meniere disease    ADHD    Achalasia    Osteoarthritis    Arthritis of right hip 05/24/2020   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   Degenerative arthritis of right knee 04/21/2020    PCP:  Shirline Frees, NP  REFERRING PROVIDER: Richardean Sale, DO  REFERRING DIAG: Primary osteoarthritis of right hip; Right hip pain  THERAPY DIAG:  Pain in right hip  Muscle weakness (generalized)  Other abnormalities of gait and mobility  Rationale for Evaluation and Treatment: Rehabilitation  ONSET DATE: Chronic for 1.5 years   SUBJECTIVE:  SUBJECTIVE STATEMENT: Patient reports she has been having problems with the right hip for about 1.5 years ago. She has to stop riding horses because she couldn't swing her right leg over the saddle. Her hip started to hurt bad in the front of the right hip, but now is mostly in the back of the hip and down the right leg. She did have a cortisone shot in the right hip which helped but then it wore off. Then recently she went up and down stairs a lot of but the following day she had a lot of pain. She reports difficulty getting dressed and she also reports difficulty with balance. She has a walking stick that she has tried recently.   PERTINENT HISTORY: See PMH above  PAIN:  Are you having pain? Yes:  NPRS scale: 0/10 currently while sitting, 8/10 with walking Pain location: Right hip Pain description: Sharp, achy, stiff Aggravating factors: Walking, attempting to  mount a horse, standing on one leg Relieving factors: Rest  PRECAUTIONS: Fall  RED FLAGS: None   WEIGHT BEARING RESTRICTIONS: No  FALLS:  Has patient fallen in last 6 months? No  PLOF: Independent  PATIENT GOALS: Improve walking   OBJECTIVE:  Note: Objective measures were completed at Evaluation unless otherwise noted. DIAGNOSTIC FINDINGS:   X-ray right hip 03/31/2023: IMPRESSION: Mild right hip osteoarthrosis.  PATIENT SURVEYS:  PSFS: 3.2 Walking: 3 Getting dressed: 3 Doing exercise for balance: 3 Silver sneaker exercises: 3 Horseback mounting: 4  COGNITION: Overall cognitive status: Within functional limits for tasks assessed     SENSATION: WFL  MUSCLE  LENGTH: Limitations with hip flexor/quad and hamstring flexibility  POSTURE:   Slight forward trunk lean, right leg remains abducted with weight shift toward left  PALPATION: Tender to palpation lateral gluteal region  LOWER EXTREMITY ROM:   Right hip PROM limited in all directions with pain primarily right groin region  LOWER EXTREMITY MMT:  MMT Right eval Left eval  Hip flexion 4- 4  Hip extension 4- 4  Hip abduction 4- 4  Hip adduction    Hip internal rotation    Hip external rotation    Knee flexion 5 5  Knee extension 5 5  Ankle dorsiflexion    Ankle plantarflexion    Ankle inversion    Ankle eversion     (Blank rows = not tested)  FUNCTIONAL TESTS:  Sit to stand: patient exhibits difficulty standing from seated position, requires use of UE for support SLS: < 5 sec bilaterally  GAIT: Distance walked: 50 ft Assistive device utilized:  Walking pole Level of assistance: Modified independence Comments: Walking pole in left hand, antalgic gait on right, decreased hip extension, decreased gait speed                                                                                                                               TREATMENT  [facility] Adult PT Treatment:                                                DATE: 05/07/2023 Side clam with green band Bridge with green band SLR Modified thomas stretch Seated hamstring stretch Gait training using single walking pole, adjusted height to fit patient correctly, instructed on using in left hand and proper sequencing  PATIENT EDUCATION:  Education details: Exam findings, POC, HEP Person educated: Patient Education method: Explanation, Demonstration, Tactile cues, Verbal cues, and Handouts Education comprehension: verbalized understanding, returned demonstration, verbal cues required, tactile cues required, and needs further education  HOME EXERCISE PROGRAM: Access Code: [email or web address]    ASSESSMENT: CLINICAL  IMPRESSION: Patient is a 83 y.o. female who was seen today for physical therapy evaluation and treatment for chronic right hip pain that is impacting her ability to walk  and be active. She exhibits limitations in passive motion of the hip in all directions, flexibility deficit of the hip flexor/quad and hamstrings, limitations in strength of the right hip, gait deviations using walking pole in left UE that are contributing to her pain and impacting her functional ability.  OBJECTIVE IMPAIRMENTS: Abnormal gait, decreased activity tolerance, decreased balance, difficulty walking, decreased ROM, decreased strength, impaired flexibility, and pain.   ACTIVITY LIMITATIONS: standing, squatting, stairs, transfers, dressing, and locomotion level  PARTICIPATION LIMITATIONS: meal prep, cleaning, community activity, and horseback riding  PERSONAL FACTORS: Age, Fitness, Past/current experiences, and Time since onset of injury/illness/exacerbation are also affecting patient's functional outcome.   REHAB POTENTIAL: Good  CLINICAL DECISION MAKING: Stable/uncomplicated  EVALUATION COMPLEXITY: Low   GOALS: Goals reviewed with patient? Yes  SHORT TERM GOALS: Target date: 06/04/2023  Patient will be I with initial HEP in order to progress with therapy. Baseline: HEP provided at eval Goal status: INITIAL  2.  Patient will report pain with walking </= 5/10 in order to reduce functional limitations Baseline: 8/10 Goal status: INITIAL  3. Patient will be able to perform sit to stand without difficulty or use of UE for support to indicate improvement in strength and mobility  Baseline: requires use of UE to stand from seated position  Goal status: INITIAL  LONG TERM GOALS: Target date: 07/02/2023  Patient will be I with final HEP to maintain progress from PT. Baseline: HEP provided at eval Goal status: INITIAL  2.  Patient will report PSFS >/= 6 in order to indicate an improvement in functional  ability Baseline: 3.2 Goal status: INITIAL  3.  Patient will demonstrate right hip strength >/= 4/5 MMT in order to improve her walking tolerance Baseline: 4-/5 MMT Goal status: INITIAL  4.  Patient will be able to maintain SLS >/= 10 seconds to indicate improvement in balance and reduced fall risk Baseline: < 5 seconds bilaterally Goal status: INITIAL   PLAN: PT FREQUENCY: 1-2x/week  PT DURATION: 8 weeks  PLANNED INTERVENTIONS: 97164- PT Re-evaluation, 97110-Therapeutic exercises, 97530- Therapeutic activity, 97112- Neuromuscular re-education, 97535- Self Care, 16109- Manual therapy, [id number]- Gait training, Patient/Family education, Balance training, Stair training, Taping, Dry Needling, Joint mobilization, Joint manipulation, Spinal manipulation, Spinal mobilization, Cryotherapy, and Moist heat  PLAN FOR NEXT SESSION: Review HEP and progress PRN, manual for right hip mobility, stretching flexibility deficits, progress right hip strength, dynamic balance tasks, gait training using walking pole    Rosana Hoes, PT, DPT, LAT, ATC 05/07/23  3:31 PM Phone: [phone] Fax: [phone]

## 2023-05-07 ENCOUNTER — Ambulatory Visit (INDEPENDENT_AMBULATORY_CARE_PROVIDER_SITE_OTHER): Admitting: Physical Therapy

## 2023-05-07 ENCOUNTER — Encounter: Payer: Self-pay | Admitting: Physical Therapy

## 2023-05-07 ENCOUNTER — Other Ambulatory Visit: Payer: Self-pay

## 2023-05-07 DIAGNOSIS — M25551 Pain in right hip: Secondary | ICD-10-CM

## 2023-05-07 DIAGNOSIS — M6281 Muscle weakness (generalized): Secondary | ICD-10-CM

## 2023-05-07 DIAGNOSIS — R2689 Other abnormalities of gait and mobility: Secondary | ICD-10-CM

## 2023-05-07 NOTE — Patient Instructions (Signed)
 Access Code: [email or web address] URL: [email or web address] Date: 05/07/2023 Prepared by: Rosana Hoes  Exercises - Clam with Resistance  - 1 x daily - 3 sets - 10 reps - Bridge with Hip Abduction and Resistance - Ground Touches  - 1 x daily - 3 sets - 10 reps - Straight Leg Raise  - 1 x daily - 3 sets - 10 reps - Modified Thomas Stretch  - 1 x daily - 3 reps - 60 seconds hold - Seated Hamstring Stretch  - 1 x daily - 3 reps - 30 seconds hold

## 2023-05-08 ENCOUNTER — Other Ambulatory Visit: Payer: Self-pay | Admitting: Internal Medicine

## 2023-05-08 ENCOUNTER — Other Ambulatory Visit: Payer: Self-pay | Admitting: Adult Health

## 2023-05-08 DIAGNOSIS — F909 Attention-deficit hyperactivity disorder, unspecified type: Secondary | ICD-10-CM

## 2023-05-08 MED ORDER — AMPHETAMINE-DEXTROAMPHETAMINE 15 MG PO TABS: 15.0000 mg | ORAL_TABLET | Freq: Two times a day (BID) | ORAL | 0 refills | Status: DC

## 2023-05-08 NOTE — Telephone Encounter (Signed)
 Copied from CRM [id number]. Topic: Clinical - Medication Refill >> May 08, 2023 11:56 AM Almira Coaster wrote: Most Recent Primary Care Visit:  Provider: Shirline Frees  Department: [facility]  Visit Type: OFFICE VISIT  Date: 03/06/2023  Medication: amphetamine-dextroamphetamine (ADDERALL) 15 MG tablet [  Has the patient contacted their pharmacy? Yes, but pharmacy has not responded and she unable to get to a live representative.  (Agent: If no, request that the patient contact the pharmacy for the refill. If patient does not wish to contact the pharmacy document the reason why and proceed with request.) (Agent: If yes, when and what did the pharmacy advise?)  Is this the correct pharmacy for this prescription? Yes If no, delete pharmacy and type the correct one.  This is the patient's preferred pharmacy:  [id number] [place], [place] - [address] [place] [address] Phone: [phone] Fax: [phone]   Has the prescription been filled recently? No  Is the patient out of the medication? Yes  Has the patient been seen for an appointment in the last year OR does the patient have an upcoming appointment? Yes  Can we respond through MyChart? Yes  Agent: Please be advised that Rx refills may take up to 3 business days. We ask that you follow-up with your pharmacy.

## 2023-05-08 NOTE — Therapy (Signed)
 OUTPATIENT PHYSICAL THERAPY TREATMENT   Patient Name: Melissa Cervantes MRN: [id number] DOB:September 07, 1940, 83 y.o., female Today's Date: 05/09/2023   END OF SESSION:  PT End of Session - 05/09/23 1204     Visit Number 2    Number of Visits 17    Date for PT Re-Evaluation 07/02/23    Authorization Type MCR    Progress Note Due on Visit 10    PT Start Time 1035    PT Stop Time 1103    PT Time Calculation (min) 28 min    Activity Tolerance Patient tolerated treatment well    Behavior During Therapy WFL for tasks assessed/performed              Past Medical History:  Diagnosis Date   Achalasia    ADHD    Allergy    Cervical dystonia    Chicken pox    Esophageal obstruction    per patient she is unable to eat solid food   Hearing loss    Meniere disease    Osteoarthritis    Spinal stenosis    Past Surgical History:  Procedure Laterality Date   BALLOON DILATION N/A 01/30/2021   Procedure: BALLOON DILATION;  Surgeon: Napoleon Form, MD;  Location: [facility];  Service: Endoscopy;  Laterality: N/A;   CESAREAN SECTION  1971, 1979   CYST EXCISION     tailbone   ESOPHAGEAL BRUSHING  01/30/2021   Procedure: ESOPHAGEAL BRUSHING;  Surgeon: Napoleon Form, MD;  Location: [facility];  Service: Endoscopy;;   ESOPHAGOGASTRODUODENOSCOPY (EGD) WITH PROPOFOL N/A 01/30/2021   Procedure: ESOPHAGOGASTRODUODENOSCOPY (EGD) WITH PROPOFOL;  Surgeon: Napoleon Form, MD;  Location: [facility];  Service: Endoscopy;  Laterality: N/A;   HELLER MYOTOMY  08/2005   NISSEN FUNDOPLICATION  2007   TOTAL HIP ARTHROPLASTY Left 06/2012   Patient Active Problem List   Diagnosis Date Noted   Dysphagia    Spinal stenosis 06/09/2020   Meniere disease    ADHD    Achalasia    Osteoarthritis    Arthritis of right hip 05/24/2020   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   Degenerative arthritis of right knee 04/21/2020    PCP:  Shirline Frees, NP  REFERRING PROVIDER: Richardean Sale, DO  REFERRING DIAG: Primary osteoarthritis of right hip; Right hip pain  THERAPY DIAG:  Pain in right hip  Muscle weakness (generalized)  Rationale for Evaluation and Treatment: Rehabilitation  ONSET DATE: Chronic for 1.5 years   SUBJECTIVE:  SUBJECTIVE STATEMENT: Patient reports the right leg is feeling better.   Eval: Patient reports she has been having problems with the right hip for about 1.5 years ago. She has to stop riding horses because she couldn't swing her right leg over the saddle. Her hip started to hurt bad in the front of the right hip, but now is mostly in the back of the hip and down the right leg. She did have a cortisone shot in the right hip which helped but then it wore off. Then recently she went up and down stairs a lot of but the following day she had a lot of pain. She reports difficulty getting dressed and she also reports difficulty with balance. She has a walking stick that she has tried recently.   PERTINENT HISTORY: See PMH above  PAIN:  Are you having pain? Yes:  NPRS scale: 3-4/10 currently, 8/10 with walking Pain location: Right hip Pain description: Sharp, achy, stiff Aggravating factors:  Walking, attempting to mount a horse, standing on one leg Relieving factors: Rest  PRECAUTIONS: Fall  PATIENT GOALS: Improve walking   OBJECTIVE:  Note: Objective measures were completed at Evaluation unless otherwise noted. DIAGNOSTIC FINDINGS:   X-ray right hip 03/31/2023: IMPRESSION: Mild right hip osteoarthrosis.  PATIENT SURVEYS:  PSFS: 3.2 Walking: 3 Getting dressed: 3 Doing exercise for balance: 3 Silver sneaker exercises: 3 Horseback mounting: 4  MUSCLE LENGTH: Limitations with hip flexor/quad and hamstring flexibility  POSTURE:   Slight forward trunk lean, right leg remains abducted with weight shift toward left  PALPATION: Tender to palpation lateral gluteal  region  LOWER EXTREMITY ROM:   Right hip PROM limited in all directions with pain primarily right groin region  LOWER EXTREMITY MMT:  MMT Right eval Left eval  Hip flexion 4- 4  Hip extension 4- 4  Hip abduction 4- 4  Hip adduction    Hip internal rotation    Hip external rotation    Knee flexion 5 5  Knee extension 5 5  Ankle dorsiflexion    Ankle plantarflexion    Ankle inversion    Ankle eversion     (Blank rows = not tested)  FUNCTIONAL TESTS:  Sit to stand: patient exhibits difficulty standing from seated position, requires use of UE for support SLS: < 5 sec bilaterally  GAIT: Distance walked: 50 ft Assistive device utilized:  Walking pole Level of assistance: Modified independence Comments: Walking pole in left hand, antalgic gait on right, decreased hip extension, decreased gait speed                                                                                                                               TREATMENT  [facility] Adult PT Treatment:                                                DATE: 05/09/2023 Side clamshell with blue 3 x 10 right Figure-4 bridge 2 x 10 SLR 2 x 10 Squat to table tap 3 x 10 3/4 tandem stance 2 x 30 sec each  PATIENT EDUCATION:  Education details: HEP update Person educated: Patient Education method: Explanation, Demonstration, Tactile cues, Verbal cues, and Handouts Education comprehension: verbalized understanding, returned demonstration, verbal cues required, tactile cues required, and needs further education  HOME EXERCISE PROGRAM: Access Code: [email or web address]    ASSESSMENT: CLINICAL IMPRESSION: Patient tolerated therapy well with no adverse effects. Therapy continued focus on progressing right hip strengthening and initiated balance training with good tolerance. She reports already improvement in right hip following evaluation and was able to progress with banded resistance for strengthening. She does report some anterior right hip  pain with SLR exercise but this improves with repetitions. She did well with squat to table without any pain. She did have difficulty with  full tandem stance but had better control with modified tandem so provided for HEP. Patient would benefit from continued skilled PT to progress mobility and strength in order to reduce pain and maximize functional ability.   Eval: Patient is a 83 y.o. female who was seen today for physical therapy evaluation and treatment for chronic right hip pain that is impacting her ability to walk and be active. She exhibits limitations in passive motion of the hip in all directions, flexibility deficit of the hip flexor/quad and hamstrings, limitations in strength of the right hip, gait deviations using walking pole in left UE that are contributing to her pain and impacting her functional ability.  OBJECTIVE IMPAIRMENTS: Abnormal gait, decreased activity tolerance, decreased balance, difficulty walking, decreased ROM, decreased strength, impaired flexibility, and pain.   ACTIVITY LIMITATIONS: standing, squatting, stairs, transfers, dressing, and locomotion level  PARTICIPATION LIMITATIONS: meal prep, cleaning, community activity, and horseback riding  PERSONAL FACTORS: Age, Fitness, Past/current experiences, and Time since onset of injury/illness/exacerbation are also affecting patient's functional outcome.    GOALS: Goals reviewed with patient? Yes  SHORT TERM GOALS: Target date: 06/04/2023  Patient will be I with initial HEP in order to progress with therapy. Baseline: HEP provided at eval Goal status: INITIAL  2.  Patient will report pain with walking </= 5/10 in order to reduce functional limitations Baseline: 8/10 Goal status: INITIAL  3. Patient will be able to perform sit to stand without difficulty or use of UE for support to indicate improvement in strength and mobility  Baseline: requires use of UE to stand from seated position  Goal status:  INITIAL  LONG TERM GOALS: Target date: 07/02/2023  Patient will be I with final HEP to maintain progress from PT. Baseline: HEP provided at eval Goal status: INITIAL  2.  Patient will report PSFS >/= 6 in order to indicate an improvement in functional ability Baseline: 3.2 Goal status: INITIAL  3.  Patient will demonstrate right hip strength >/= 4/5 MMT in order to improve her walking tolerance Baseline: 4-/5 MMT Goal status: INITIAL  4.  Patient will be able to maintain SLS >/= 10 seconds to indicate improvement in balance and reduced fall risk Baseline: < 5 seconds bilaterally Goal status: INITIAL   PLAN: PT FREQUENCY: 1-2x/week  PT DURATION: 8 weeks  PLANNED INTERVENTIONS: 97164- PT Re-evaluation, 97110-Therapeutic exercises, 97530- Therapeutic activity, 97112- Neuromuscular re-education, 97535- Self Care, 16109- Manual therapy, [phone]- Gait training, Patient/Family education, Balance training, Stair training, Taping, Dry Needling, Joint mobilization, Joint manipulation, Spinal manipulation, Spinal mobilization, Cryotherapy, and Moist heat  PLAN FOR NEXT SESSION: Review HEP and progress PRN, manual for right hip mobility, stretching flexibility deficits, progress right hip strength, dynamic balance tasks, gait training using walking pole    Rosana Hoes, PT, DPT, LAT, ATC 05/09/23  12:05 PM Phone: [phone] Fax: [phone]

## 2023-05-09 ENCOUNTER — Other Ambulatory Visit: Payer: Self-pay

## 2023-05-09 ENCOUNTER — Ambulatory Visit (INDEPENDENT_AMBULATORY_CARE_PROVIDER_SITE_OTHER): Admitting: Physical Therapy

## 2023-05-09 ENCOUNTER — Encounter: Payer: Self-pay | Admitting: Physical Therapy

## 2023-05-09 DIAGNOSIS — M6281 Muscle weakness (generalized): Secondary | ICD-10-CM | POA: Diagnosis not present

## 2023-05-09 DIAGNOSIS — M25551 Pain in right hip: Secondary | ICD-10-CM

## 2023-05-09 NOTE — Patient Instructions (Signed)
 Access Code: [email or web address] URL: [email or web address] Date: 05/09/2023 Prepared by: Rosana Hoes  Exercises - Clam with Resistance  - 1 x daily - 3 sets - 10 reps - Bridge with Hip Abduction and Resistance - Ground Touches  - 1 x daily - 3 sets - 10 reps - Straight Leg Raise  - 1 x daily - 3 sets - 10 reps - Modified Thomas Stretch  - 1 x daily - 3 reps - 60 seconds hold - Seated Hamstring Stretch  - 1 x daily - 3 reps - 30 seconds hold - Squat with Chair Touch  - 1 x daily - 3 sets - 10 reps - Standing Romberg to 3/4 Tandem Stance  - 1 x daily - 3 reps - 30 seconds hold

## 2023-05-12 ENCOUNTER — Ambulatory Visit (INDEPENDENT_AMBULATORY_CARE_PROVIDER_SITE_OTHER): Admitting: Physical Therapy

## 2023-05-12 ENCOUNTER — Other Ambulatory Visit: Payer: Self-pay

## 2023-05-12 ENCOUNTER — Encounter: Payer: Self-pay | Admitting: Physical Therapy

## 2023-05-12 DIAGNOSIS — M6281 Muscle weakness (generalized): Secondary | ICD-10-CM

## 2023-05-12 DIAGNOSIS — M25551 Pain in right hip: Secondary | ICD-10-CM

## 2023-05-12 NOTE — Patient Instructions (Signed)
 Access Code: [email or web address] URL: [email or web address] Date: 05/12/2023 Prepared by: Rosana Hoes  Exercises - Clam with Resistance  - 1 x daily - 3 sets - 10 reps - Single Leg Bridge  - 1 x daily - 3 sets - 10 reps - Straight Leg Raise  - 1 x daily - 3 sets - 10 reps - Modified Thomas Stretch  - 1 x daily - 3 reps - 60 seconds hold - Seated Hamstring Stretch  - 1 x daily - 3 reps - 30 seconds hold - Squat with Chair Touch  - 1 x daily - 3 sets - 10 reps - Standing Romberg to 3/4 Tandem Stance  - 1 x daily - 3 reps - 30 seconds hold - Prone Hip Extension on Table  - 1 x daily - 3 sets - 10 reps

## 2023-05-12 NOTE — Therapy (Signed)
 OUTPATIENT PHYSICAL THERAPY TREATMENT   Patient Name: Melissa Cervantes MRN: [id number] DOB:11-22-1940, 83 y.o., female Today's Date: 05/12/2023   END OF SESSION:  PT End of Session - 05/12/23 1304     Visit Number 3    Number of Visits 17    Date for PT Re-Evaluation 07/02/23    Authorization Type MCR    Progress Note Due on Visit 10    PT Start Time 1300    PT Stop Time 1345    PT Time Calculation (min) 45 min    Activity Tolerance Patient tolerated treatment well    Behavior During Therapy WFL for tasks assessed/performed               Past Medical History:  Diagnosis Date   Achalasia    ADHD    Allergy    Cervical dystonia    Chicken pox    Esophageal obstruction    per patient she is unable to eat solid food   Hearing loss    Meniere disease    Osteoarthritis    Spinal stenosis    Past Surgical History:  Procedure Laterality Date   BALLOON DILATION N/A 01/30/2021   Procedure: BALLOON DILATION;  Surgeon: Napoleon Form, MD;  Location: [facility];  Service: Endoscopy;  Laterality: N/A;   CESAREAN SECTION  1971, 1979   CYST EXCISION     tailbone   ESOPHAGEAL BRUSHING  01/30/2021   Procedure: ESOPHAGEAL BRUSHING;  Surgeon: Napoleon Form, MD;  Location: [facility];  Service: Endoscopy;;   ESOPHAGOGASTRODUODENOSCOPY (EGD) WITH PROPOFOL N/A 01/30/2021   Procedure: ESOPHAGOGASTRODUODENOSCOPY (EGD) WITH PROPOFOL;  Surgeon: Napoleon Form, MD;  Location: [facility];  Service: Endoscopy;  Laterality: N/A;   HELLER MYOTOMY  08/2005   NISSEN FUNDOPLICATION  2007   TOTAL HIP ARTHROPLASTY Left 06/2012   Patient Active Problem List   Diagnosis Date Noted   Dysphagia    Spinal stenosis 06/09/2020   Meniere disease    ADHD    Achalasia    Osteoarthritis    Arthritis of right hip 05/24/2020   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   Degenerative arthritis of right knee 04/21/2020    PCP:  Shirline Frees, NP  REFERRING PROVIDER: Richardean Sale, DO  REFERRING DIAG: Primary osteoarthritis of right hip; Right hip pain  THERAPY DIAG:  Pain in right hip  Muscle weakness (generalized)  Rationale for Evaluation and Treatment: Rehabilitation  ONSET DATE: Chronic for 1.5 years   SUBJECTIVE:  SUBJECTIVE STATEMENT: Patient reports sometimes she feels like her hip is feeling better then other times where it hurts but it isn't terrible.   Eval: Patient reports she has been having problems with the right hip for about 1.5 years ago. She has to stop riding horses because she couldn't swing her right leg over the saddle. Her hip started to hurt bad in the front of the right hip, but now is mostly in the back of the hip and down the right leg. She did have a cortisone shot in the right hip which helped but then it wore off. Then recently she went up and down stairs a lot of but the following day she had a lot of pain. She reports difficulty getting dressed and she also reports difficulty with balance. She has a walking stick that she has tried recently.   PERTINENT HISTORY: See PMH above  PAIN:  Are you having pain? Yes:  NPRS scale: 3-4/10 currently,  8/10 with walking Pain location: Right hip Pain description: Sharp, achy, stiff Aggravating factors: Walking, attempting to mount a horse, standing on one leg Relieving factors: Rest  PRECAUTIONS: Fall  PATIENT GOALS: Improve walking   OBJECTIVE:  Note: Objective measures were completed at Evaluation unless otherwise noted. DIAGNOSTIC FINDINGS:   X-ray right hip 03/31/2023: IMPRESSION: Mild right hip osteoarthrosis.  PATIENT SURVEYS:  PSFS: 3.2 Walking: 3 Getting dressed: 3 Doing exercise for balance: 3 Silver sneaker exercises: 3 Horseback mounting: 4  MUSCLE LENGTH: Limitations with hip flexor/quad and hamstring flexibility  POSTURE:   Slight forward trunk lean, right leg remains abducted with weight shift  toward left  PALPATION: Tender to palpation lateral gluteal region  LOWER EXTREMITY ROM:   Right hip PROM limited in all directions with pain primarily right groin region  LOWER EXTREMITY MMT:  MMT Right eval Left eval  Hip flexion 4- 4  Hip extension 4- 4  Hip abduction 4- 4  Hip adduction    Hip internal rotation    Hip external rotation    Knee flexion 5 5  Knee extension 5 5  Ankle dorsiflexion    Ankle plantarflexion    Ankle inversion    Ankle eversion     (Blank rows = not tested)  FUNCTIONAL TESTS:  Sit to stand: patient exhibits difficulty standing from seated position, requires use of UE for support SLS: < 5 sec bilaterally  GAIT: Distance walked: 50 ft Assistive device utilized:  Walking pole Level of assistance: Modified independence Comments: Walking pole in left hand, antalgic gait on right, decreased hip extension, decreased gait speed                                                                                                                               TREATMENT  [facility] Adult PT Treatment:                                                DATE: 05/12/2023 Recumbent bike L4 x 5 min to improve endurance and workload capacity Squat to table tap x 10, holding 10# at chest 2 x 10 Standing hip abduction with yellow at mid shin 3 x 10 each Standing hip extension with yellow at mid shin 3 x 10 each Standing march with 2# 2 x 20 SL bridge x 10 each Prone hip extension with 2# 2 x 10 right Standing bent over hip extension with green at knees x 10 each  PATIENT EDUCATION:  Education details: HEP update Person educated: Patient Education method: Explanation, Demonstration, Tactile cues, Verbal cues, and Handouts Education comprehension: verbalized understanding, returned demonstration, verbal cues required, tactile cues required, and needs further education  HOME EXERCISE PROGRAM: Access Code: [email or web address]    ASSESSMENT: CLINICAL IMPRESSION: Patient  tolerated therapy well with no adverse effects. Therapy  focused on progression of right hip strength. She was able to progress to standing hip strengthening with good tolerance and worked on [employer] bridge with patient demonstrates much more difficulty on the right. Updated her HEP to progress glute strengthening for home. Patient would benefit from continued skilled PT to progress mobility and strength in order to reduce pain and maximize functional ability.   Eval: Patient is a 83 y.o. female who was seen today for physical therapy evaluation and treatment for chronic right hip pain that is impacting her ability to walk and be active. She exhibits limitations in passive motion of the hip in all directions, flexibility deficit of the hip flexor/quad and hamstrings, limitations in strength of the right hip, gait deviations using walking pole in left UE that are contributing to her pain and impacting her functional ability.  OBJECTIVE IMPAIRMENTS: Abnormal gait, decreased activity tolerance, decreased balance, difficulty walking, decreased ROM, decreased strength, impaired flexibility, and pain.   ACTIVITY LIMITATIONS: standing, squatting, stairs, transfers, dressing, and locomotion level  PARTICIPATION LIMITATIONS: meal prep, cleaning, community activity, and horseback riding  PERSONAL FACTORS: Age, Fitness, Past/current experiences, and Time since onset of injury/illness/exacerbation are also affecting patient's functional outcome.    GOALS: Goals reviewed with patient? Yes  SHORT TERM GOALS: Target date: 06/04/2023  Patient will be I with initial HEP in order to progress with therapy. Baseline: HEP provided at eval Goal status: INITIAL  2.  Patient will report pain with walking </= 5/10 in order to reduce functional limitations Baseline: 8/10 Goal status: INITIAL  3. Patient will be able to perform sit to stand without difficulty or use of UE for support to indicate improvement in strength and  mobility  Baseline: requires use of UE to stand from seated position  Goal status: INITIAL  LONG TERM GOALS: Target date: 07/02/2023  Patient will be I with final HEP to maintain progress from PT. Baseline: HEP provided at eval Goal status: INITIAL  2.  Patient will report PSFS >/= 6 in order to indicate an improvement in functional ability Baseline: 3.2 Goal status: INITIAL  3.  Patient will demonstrate right hip strength >/= 4/5 MMT in order to improve her walking tolerance Baseline: 4-/5 MMT Goal status: INITIAL  4.  Patient will be able to maintain SLS >/= 10 seconds to indicate improvement in balance and reduced fall risk Baseline: < 5 seconds bilaterally Goal status: INITIAL   PLAN: PT FREQUENCY: 1-2x/week  PT DURATION: 8 weeks  PLANNED INTERVENTIONS: 97164- PT Re-evaluation, 97110-Therapeutic exercises, 97530- Therapeutic activity, 97112- Neuromuscular re-education, 97535- Self Care, 16109- Manual therapy, [phone]- Gait training, Patient/Family education, Balance training, Stair training, Taping, Dry Needling, Joint mobilization, Joint manipulation, Spinal manipulation, Spinal mobilization, Cryotherapy, and Moist heat  PLAN FOR NEXT SESSION: Review HEP and progress PRN, manual for right hip mobility, stretching flexibility deficits, progress right hip strength, dynamic balance tasks, gait training using walking pole    Rosana Hoes, PT, DPT, LAT, ATC 05/12/23  2:51 PM Phone: [phone] Fax: [phone]

## 2023-05-14 NOTE — Therapy (Signed)
 OUTPATIENT PHYSICAL THERAPY TREATMENT   Patient Name: Melissa Cervantes MRN: [id number] DOB:Sep 01, 1940, 83 y.o., female Today's Date: 05/14/2023   END OF SESSION:      Past Medical History:  Diagnosis Date   Achalasia    ADHD    Allergy    Cervical dystonia    Chicken pox    Esophageal obstruction    per patient she is unable to eat solid food   Hearing loss    Meniere disease    Osteoarthritis    Spinal stenosis    Past Surgical History:  Procedure Laterality Date   BALLOON DILATION N/A 01/30/2021   Procedure: BALLOON DILATION;  Surgeon: Napoleon Form, MD;  Location: [facility];  Service: Endoscopy;  Laterality: N/A;   CESAREAN SECTION  1971, 1979   CYST EXCISION     tailbone   ESOPHAGEAL BRUSHING  01/30/2021   Procedure: ESOPHAGEAL BRUSHING;  Surgeon: Napoleon Form, MD;  Location: [facility];  Service: Endoscopy;;   ESOPHAGOGASTRODUODENOSCOPY (EGD) WITH PROPOFOL N/A 01/30/2021   Procedure: ESOPHAGOGASTRODUODENOSCOPY (EGD) WITH PROPOFOL;  Surgeon: Napoleon Form, MD;  Location: [facility];  Service: Endoscopy;  Laterality: N/A;   HELLER MYOTOMY  08/2005   NISSEN FUNDOPLICATION  2007   TOTAL HIP ARTHROPLASTY Left 06/2012   Patient Active Problem List   Diagnosis Date Noted   Dysphagia    Spinal stenosis 06/09/2020   Meniere disease    ADHD    Achalasia    Osteoarthritis    Arthritis of right hip 05/24/2020   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   Degenerative arthritis of right knee 04/21/2020    PCP: Shirline Frees, NP  REFERRING PROVIDER: Richardean Sale, DO  REFERRING DIAG: Primary osteoarthritis of right hip; Right hip pain  THERAPY DIAG:  No diagnosis found.  Rationale for Evaluation and Treatment: Rehabilitation  ONSET DATE: Chronic for 1.5 years   SUBJECTIVE:  SUBJECTIVE STATEMENT: Patient reports sometimes she feels like her hip is feeling better then other times  where it hurts but it isn't terrible.   Eval: Patient reports she has been having problems with the right hip for about 1.5 years ago. She has to stop riding horses because she couldn't swing her right leg over the saddle. Her hip started to hurt bad in the front of the right hip, but now is mostly in the back of the hip and down the right leg. She did have a cortisone shot in the right hip which helped but then it wore off. Then recently she went up and down stairs a lot of but the following day she had a lot of pain. She reports difficulty getting dressed and she also reports difficulty with balance. She has a walking stick that she has tried recently.   PERTINENT HISTORY: See PMH above  PAIN:  Are you having pain? Yes:  NPRS scale: 3-4/10 currently, 8/10 with walking Pain location: Right hip Pain description: Sharp, achy, stiff Aggravating factors: Walking, attempting to mount a horse, standing on one leg Relieving factors: Rest  PRECAUTIONS: Fall  PATIENT GOALS: Improve walking   OBJECTIVE:  Note: Objective measures were completed at Evaluation unless otherwise noted. DIAGNOSTIC FINDINGS:   X-ray right hip 03/31/2023: IMPRESSION: Mild right hip osteoarthrosis.  PATIENT SURVEYS:  PSFS: 3.2 Walking: 3 Getting dressed: 3 Doing exercise for balance: 3 Silver sneaker exercises: 3 Horseback mounting: 4  MUSCLE LENGTH: Limitations with hip flexor/quad and hamstring flexibility  POSTURE:   Slight forward  trunk lean, right leg remains abducted with weight shift toward left  PALPATION: Tender to palpation lateral gluteal region  LOWER EXTREMITY ROM:   Right hip PROM limited in all directions with pain primarily right groin region  LOWER EXTREMITY MMT:  MMT Right eval Left eval  Hip flexion 4- 4  Hip extension 4- 4  Hip abduction 4- 4  Hip adduction    Hip internal rotation    Hip external rotation    Knee flexion 5 5  Knee extension 5 5  Ankle dorsiflexion     Ankle plantarflexion    Ankle inversion    Ankle eversion     (Blank rows = not tested)  FUNCTIONAL TESTS:  Sit to stand: patient exhibits difficulty standing from seated position, requires use of UE for support SLS: < 5 sec bilaterally  GAIT: Distance walked: 50 ft Assistive device utilized:  Walking pole Level of assistance: Modified independence Comments: Walking pole in left hand, antalgic gait on right, decreased hip extension, decreased gait speed                                                                                                                               TREATMENT  [facility] Adult PT Treatment:                                                DATE: 05/15/2023 Recumbent bike L4 x 5 min to improve endurance and workload capacity Squat to table tap x 10, holding 10# at chest 2 x 10 Standing hip abduction with yellow at mid shin 3 x 10 each Standing hip extension with yellow at mid shin 3 x 10 each Standing march with 2# 2 x 20 SL bridge x 10 each Prone hip extension with 2# 2 x 10 right Standing bent over hip extension with green at knees x 10 each  PATIENT EDUCATION:  Education details: HEP update Person educated: Patient Education method: Explanation, Demonstration, Tactile cues, Verbal cues, and Handouts Education comprehension: verbalized understanding, returned demonstration, verbal cues required, tactile cues required, and needs further education  HOME EXERCISE PROGRAM: Access Code: [email or web address]    ASSESSMENT: CLINICAL IMPRESSION: Patient tolerated therapy well with no adverse effects. *** Patient would benefit from continued skilled PT to progress mobility and strength in order to reduce pain and maximize functional ability.  Therapy focused on progression of right hip strength. She was able to progress to standing hip strengthening with good tolerance and worked on [employer] bridge with patient demonstrates much more difficulty on the right. Updated her HEP to  progress glute strengthening for home.    Eval: Patient is a 83 y.o. female who was seen today for physical therapy evaluation and treatment for chronic right hip pain that is impacting her ability to walk and be active. She  exhibits limitations in passive motion of the hip in all directions, flexibility deficit of the hip flexor/quad and hamstrings, limitations in strength of the right hip, gait deviations using walking pole in left UE that are contributing to her pain and impacting her functional ability.  OBJECTIVE IMPAIRMENTS: Abnormal gait, decreased activity tolerance, decreased balance, difficulty walking, decreased ROM, decreased strength, impaired flexibility, and pain.   ACTIVITY LIMITATIONS: standing, squatting, stairs, transfers, dressing, and locomotion level  PARTICIPATION LIMITATIONS: meal prep, cleaning, community activity, and horseback riding  PERSONAL FACTORS: Age, Fitness, Past/current experiences, and Time since onset of injury/illness/exacerbation are also affecting patient's functional outcome.    GOALS: Goals reviewed with patient? Yes  SHORT TERM GOALS: Target date: 06/04/2023  Patient will be I with initial HEP in order to progress with therapy. Baseline: HEP provided at eval Goal status: INITIAL  2.  Patient will report pain with walking </= 5/10 in order to reduce functional limitations Baseline: 8/10 Goal status: INITIAL  3. Patient will be able to perform sit to stand without difficulty or use of UE for support to indicate improvement in strength and mobility  Baseline: requires use of UE to stand from seated position  Goal status: INITIAL  LONG TERM GOALS: Target date: 07/02/2023  Patient will be I with final HEP to maintain progress from PT. Baseline: HEP provided at eval Goal status: INITIAL  2.  Patient will report PSFS >/= 6 in order to indicate an improvement in functional ability Baseline: 3.2 Goal status: INITIAL  3.  Patient will  demonstrate right hip strength >/= 4/5 MMT in order to improve her walking tolerance Baseline: 4-/5 MMT Goal status: INITIAL  4.  Patient will be able to maintain SLS >/= 10 seconds to indicate improvement in balance and reduced fall risk Baseline: < 5 seconds bilaterally Goal status: INITIAL   PLAN: PT FREQUENCY: 1-2x/week  PT DURATION: 8 weeks  PLANNED INTERVENTIONS: 97164- PT Re-evaluation, 97110-Therapeutic exercises, 97530- Therapeutic activity, 97112- Neuromuscular re-education, 97535- Self Care, [id number]- Manual therapy, [phone]- Gait training, Patient/Family education, Balance training, Stair training, Taping, Dry Needling, Joint mobilization, Joint manipulation, Spinal manipulation, Spinal mobilization, Cryotherapy, and Moist heat  PLAN FOR NEXT SESSION: Review HEP and progress PRN, manual for right hip mobility, stretching flexibility deficits, progress right hip strength, dynamic balance tasks, gait training using walking pole    Rosana Hoes, PT, DPT, LAT, ATC 05/14/23  4:25 PM Phone: [phone] Fax: [phone]

## 2023-05-15 ENCOUNTER — Ambulatory Visit (INDEPENDENT_AMBULATORY_CARE_PROVIDER_SITE_OTHER): Admitting: Physical Therapy

## 2023-05-15 ENCOUNTER — Encounter: Payer: Self-pay | Admitting: Physical Therapy

## 2023-05-15 ENCOUNTER — Other Ambulatory Visit: Payer: Self-pay

## 2023-05-15 DIAGNOSIS — M25551 Pain in right hip: Secondary | ICD-10-CM

## 2023-05-15 DIAGNOSIS — M6281 Muscle weakness (generalized): Secondary | ICD-10-CM | POA: Diagnosis not present

## 2023-05-15 NOTE — Patient Instructions (Signed)
 Access Code: [email or web address] URL: [email or web address] Date: 05/15/2023 Prepared by: Rosana Hoes  Exercises - Clam with Resistance  - 1 x daily - 3 sets - 10 reps - Single Leg Bridge  - 1 x daily - 3 sets - 10 reps - Straight Leg Raise with Ankle Weight  - 1 x daily - 3 sets - 10 reps - Sidelying Hip Abduction with Ankle Weight  - 1 x daily - 3 sets - 10 reps - Modified Thomas Stretch  - 1 x daily - 3 reps - 60 seconds hold - Seated Long Arc Quad with Ankle Weight  - 1 x daily - 3 sets - 20 reps - Seated Hamstring Stretch  - 1 x daily - 3 reps - 30 seconds hold - Squat with Chair Touch  - 1 x daily - 3 sets - 10 reps - Standing Romberg to 3/4 Tandem Stance  - 1 x daily - 3 reps - 30 seconds hold - Prone Hip Extension on Table  - 1 x daily - 3 sets - 10 reps

## 2023-05-19 ENCOUNTER — Ambulatory Visit: Admitting: Physical Therapy

## 2023-05-19 ENCOUNTER — Encounter: Payer: Self-pay | Admitting: Physical Therapy

## 2023-05-19 ENCOUNTER — Other Ambulatory Visit: Payer: Self-pay

## 2023-05-19 DIAGNOSIS — M25551 Pain in right hip: Secondary | ICD-10-CM | POA: Diagnosis not present

## 2023-05-19 DIAGNOSIS — M6281 Muscle weakness (generalized): Secondary | ICD-10-CM | POA: Diagnosis not present

## 2023-05-19 NOTE — Patient Instructions (Signed)
 Access Code: [email or web address] URL: [email or web address] Date: 05/19/2023 Prepared by: Leah Primus  Exercises - Modified Becki Bouton  - 1 x daily - 3 reps - 20 seconds hold - Hooklying Single Knee to Chest Stretch  - 1 x daily - 3 reps - 20 seconds hold - Clam  - 1 x daily - 3 sets - 10 reps - Bridge  - 1 x daily - 3 sets - 10 reps - Standing Marching  - 1 x daily - 3 sets - 20 reps - Seated Hamstring Stretch  - 1 x daily - 3 reps - 30 seconds hold

## 2023-05-19 NOTE — Therapy (Addendum)
 OUTPATIENT PHYSICAL THERAPY TREATMENT  DISCHARGE   Patient Name: Melissa Cervantes MRN: [id number] DOB:02/07/1940, 83 y.o., female Today's Date: 05/19/2023   END OF SESSION:  PT End of Session - 05/19/23 1110     Visit Number 5    Number of Visits 17    Date for PT Re-Evaluation 07/02/23    Authorization Type MCR    Progress Note Due on Visit 10    PT Start Time 1107    PT Stop Time 1145    PT Time Calculation (min) 38 min    Activity Tolerance Patient tolerated treatment well    Behavior During Therapy WFL for tasks assessed/performed                 Past Medical History:  Diagnosis Date   Achalasia    ADHD    Allergy    Cervical dystonia    Chicken pox    Esophageal obstruction    per patient she is unable to eat solid food   Hearing loss    Meniere disease    Osteoarthritis    Spinal stenosis    Past Surgical History:  Procedure Laterality Date   BALLOON DILATION N/A 01/30/2021   Procedure: BALLOON DILATION;  Surgeon: Shila Gustav GAILS, MD;  Location: [facility];  Service: Endoscopy;  Laterality: N/A;   CESAREAN SECTION  1971, 1979   CYST EXCISION     tailbone   ESOPHAGEAL BRUSHING  01/30/2021   Procedure: ESOPHAGEAL BRUSHING;  Surgeon: Shila Gustav GAILS, MD;  Location: [facility];  Service: Endoscopy;;   ESOPHAGOGASTRODUODENOSCOPY (EGD) WITH PROPOFOL  N/A 01/30/2021   Procedure: ESOPHAGOGASTRODUODENOSCOPY (EGD) WITH PROPOFOL ;  Surgeon: Shila Gustav GAILS, MD;  Location: [facility];  Service: Endoscopy;  Laterality: N/A;   HELLER MYOTOMY  08/2005   NISSEN FUNDOPLICATION  2007   TOTAL HIP ARTHROPLASTY Left 06/2012   Patient Active Problem List   Diagnosis Date Noted   Dysphagia    Spinal stenosis 06/09/2020   Meniere disease    ADHD    Achalasia    Osteoarthritis    Arthritis of right hip 05/24/2020   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   Degenerative arthritis of right knee  04/21/2020    PCP: Merna Huxley, NP  REFERRING PROVIDER: Leonce Katz, DO  REFERRING DIAG: Primary osteoarthritis of right hip; Right hip pain  THERAPY DIAG:  Pain in right hip  Muscle weakness (generalized)  Rationale for Evaluation and Treatment: Rehabilitation  ONSET DATE: Chronic for 1.5 years   SUBJECTIVE:  SUBJECTIVE STATEMENT: Patient reports she is feeling much worse today. She is having pain in the right knee, thigh, and hip. She thinks it is from over doing it with the exercises. She is now having trouble walking again and is frustrated with current situation.  Eval: Patient reports she has been having problems with the right hip for about 1.5 years ago. She has to stop riding horses because she couldn't swing her right leg over the saddle. Her hip started to hurt bad in the front of the right hip, but now is mostly in the back of the hip and down the right leg. She did have a cortisone shot in the right hip which helped but then it wore off. Then recently she went up and down stairs a lot of but the following day she had a lot of pain. She reports difficulty getting dressed and she also reports difficulty with balance. She has a walking stick  that she has tried recently.   PERTINENT HISTORY: See PMH above  PAIN:  Are you having pain? Yes:  NPRS scale: 8/10 currently, 8/10 with walking Pain location: Right hip Pain description: Sharp, achy, stiff Aggravating factors: Walking, attempting to mount a horse, standing on one leg Relieving factors: Rest  PRECAUTIONS: Fall  PATIENT GOALS: Improve walking   OBJECTIVE:  Note: Objective measures were completed at Evaluation unless otherwise noted. DIAGNOSTIC FINDINGS:   X-ray right hip 03/31/2023: IMPRESSION: Mild right hip osteoarthrosis.  PATIENT SURVEYS:  PSFS: 3.2 Walking: 3 Getting dressed: 3 Doing exercise for balance: 3 Silver sneaker exercises: 3 Horseback mounting: 4  MUSCLE LENGTH: Limitations  with hip flexor/quad and hamstring flexibility  POSTURE:   Slight forward trunk lean, right leg remains abducted with weight shift toward left  PALPATION: Tender to palpation lateral gluteal region  LOWER EXTREMITY ROM:   Right hip PROM limited in all directions with pain primarily right groin region  LOWER EXTREMITY MMT:  MMT Right eval Left eval Rt 05/15/2023  Hip flexion 4- 4   Hip extension 4- 4 4-  Hip abduction 4- 4   Hip adduction     Hip internal rotation     Hip external rotation     Knee flexion 5 5   Knee extension 5 5   Ankle dorsiflexion     Ankle plantarflexion     Ankle inversion     Ankle eversion      (Blank rows = not tested)  FUNCTIONAL TESTS:  Sit to stand: patient exhibits difficulty standing from seated position, requires use of UE for support SLS: < 5 sec bilaterally  GAIT: Distance walked: 50 ft Assistive device utilized: Walking pole Level of assistance: Modified independence Comments: Walking pole in left hand, antalgic gait on right, decreased hip extension, decreased gait speed                                                                                                                               TREATMENT  [facility] Adult PT Treatment:                                                DATE: 05/19/2023 Supine hamstring stretch Supine right hip PROM Modified thomas stretch STM to right quad, ITB, hamstring, gluteal region with FR Cold pack x 5 min to right lateral thigh and hip Education provided on likely cause of exacerbation due to strengthening exercises and using ankle weight at home, likely flare of right hip and knee OA symptoms and delayed onset muscle soreness. Need to take rest break from exercises and then restart as tolerated. Take short walks as tolerated and using ice modality for pain relief at home.  PATIENT EDUCATION:  Education details: HEP Primary school teacher educated: Patient Education method: Programmer, multimedia,  Facilities manager,  Tactile cues, Verbal cues, and Handouts Education comprehension: verbalized understanding, returned demonstration, verbal cues required, tactile cues required, and needs further education  HOME EXERCISE PROGRAM: Access Code: [email or web address]  Access Code: [email or web address] (regressed HEP)   ASSESSMENT: CLINICAL IMPRESSION: Patient arrived reporting increased right hip, thigh, and knee pain since her last visit. The pain mainly seems to be an exacerbation of hip and knee OA and muscle soreness/tightness from progression of strengthening exercises and likely just progressing too fast/too quickly with her HEP using the ankle weights at home. Patient seemed frustrated with regression in her mobility and increased pain since her last visit. This visit focused primarily on educating patient on likely cause of increased right leg pain and performing gentle stretching and manual to reduce her pain. She was provided with a cold pack for the lateral thigh and hip which seemed to help alleviate some pain. The majority of her pain does seem to be muscular running from the right knee to the hip. She was provided a new HEP for regression of exercises and instructed to take a few days of rest to allow symptoms to reduce before restarting HEP. Patient would benefit from continued skilled PT to progress mobility and strength in order to reduce pain and maximize functional ability.   Eval: Patient is a 83 y.o. female who was seen today for physical therapy evaluation and treatment for chronic right hip pain that is impacting her ability to walk and be active. She exhibits limitations in passive motion of the hip in all directions, flexibility deficit of the hip flexor/quad and hamstrings, limitations in strength of the right hip, gait deviations using walking pole in left UE that are contributing to her pain and impacting her functional ability.  OBJECTIVE IMPAIRMENTS: Abnormal gait, decreased activity tolerance,  decreased balance, difficulty walking, decreased ROM, decreased strength, impaired flexibility, and pain.   ACTIVITY LIMITATIONS: standing, squatting, stairs, transfers, dressing, and locomotion level  PARTICIPATION LIMITATIONS: meal prep, cleaning, community activity, and horseback riding  PERSONAL FACTORS: Age, Fitness, Past/current experiences, and Time since onset of injury/illness/exacerbation are also affecting patient's functional outcome.    GOALS: Goals reviewed with patient? Yes  SHORT TERM GOALS: Target date: 06/04/2023  Patient will be I with initial HEP in order to progress with therapy. Baseline: HEP provided at eval Goal status: INITIAL  2.  Patient will report pain with walking </= 5/10 in order to reduce functional limitations Baseline: 8/10 Goal status: INITIAL  3. Patient will be able to perform sit to stand without difficulty or use of UE for support to indicate improvement in strength and mobility  Baseline: requires use of UE to stand from seated position  Goal status: INITIAL  LONG TERM GOALS: Target date: 07/02/2023  Patient will be I with final HEP to maintain progress from PT. Baseline: HEP provided at eval Goal status: INITIAL  2.  Patient will report PSFS >/= 6 in order to indicate an improvement in functional ability Baseline: 3.2 Goal status: INITIAL  3.  Patient will demonstrate right hip strength >/= 4/5 MMT in order to improve her walking tolerance Baseline: 4-/5 MMT Goal status: INITIAL  4.  Patient will be able to maintain SLS >/= 10 seconds to indicate improvement in balance and reduced fall risk Baseline: < 5 seconds bilaterally Goal status: INITIAL   PLAN: PT FREQUENCY: 1-2x/week  PT DURATION: 8 weeks  PLANNED INTERVENTIONS: 97164- PT Re-evaluation, 97110-Therapeutic exercises, 97530- Therapeutic activity, [id number]- Neuromuscular re-education, 97535- Self Care, 02859- Manual therapy, [id number]- Gait  training, Patient/Family education,  Balance training, Stair training, Taping, Dry Needling, Joint mobilization, Joint manipulation, Spinal manipulation, Spinal mobilization, Cryotherapy, and Moist heat  PLAN FOR NEXT SESSION: Review HEP and progress PRN, manual for right hip mobility, stretching flexibility deficits, progress right hip strength, dynamic balance tasks, gait training using walking pole    Elaine Daring, PT, DPT, LAT, ATC 05/19/23  12:56 PM Phone: [phone] Fax: [phone]    PHYSICAL THERAPY DISCHARGE SUMMARY  Visits from Start of Care: 5  Current functional level related to goals / functional outcomes: See above   Remaining deficits: See above   Education / Equipment: HEP   Patient agrees to discharge. Patient goals were not met. Patient is being discharged due to not returning since the last visit.   Elaine Daring, PT, DPT, LAT, ATC 11/26/23  11:20 AM Phone: [phone] Fax: [phone]

## 2023-05-20 ENCOUNTER — Other Ambulatory Visit (HOSPITAL_BASED_OUTPATIENT_CLINIC_OR_DEPARTMENT_OTHER): Payer: Self-pay

## 2023-05-20 MED FILL — Simvastatin Tab 10 MG: ORAL | 90 days supply | Qty: 90 | Fill #0 | Status: CN

## 2023-05-20 MED FILL — Lisinopril Tab 2.5 MG: ORAL | 90 days supply | Qty: 90 | Fill #0 | Status: CN

## 2023-05-22 ENCOUNTER — Encounter: Admitting: Physical Therapy

## 2023-05-24 ENCOUNTER — Other Ambulatory Visit (HOSPITAL_BASED_OUTPATIENT_CLINIC_OR_DEPARTMENT_OTHER): Payer: Self-pay

## 2023-05-24 ENCOUNTER — Other Ambulatory Visit: Payer: Self-pay | Admitting: Adult Health

## 2023-05-24 DIAGNOSIS — I10 Essential (primary) hypertension: Secondary | ICD-10-CM

## 2023-05-26 ENCOUNTER — Encounter: Payer: Self-pay | Admitting: Sports Medicine

## 2023-05-26 ENCOUNTER — Other Ambulatory Visit (HOSPITAL_COMMUNITY): Payer: Self-pay

## 2023-05-26 ENCOUNTER — Encounter: Admitting: Physical Therapy

## 2023-05-26 ENCOUNTER — Other Ambulatory Visit: Payer: Self-pay | Admitting: Sports Medicine

## 2023-05-26 ENCOUNTER — Telehealth: Payer: Self-pay | Admitting: Sports Medicine

## 2023-05-26 MED ORDER — MELOXICAM 15 MG PO TABS
15.0000 mg | ORAL_TABLET | Freq: Every day | ORAL | 0 refills | Status: DC
  Filled 2023-05-26 (×2): qty 30, 30d supply, fill #0

## 2023-05-26 MED ORDER — MELOXICAM 15 MG PO TABS: 15.0000 mg | ORAL_TABLET | Freq: Every day | ORAL | 0 refills | Status: DC

## 2023-05-26 NOTE — Telephone Encounter (Signed)
 Patient called back to return a call. Will you give her a call back.

## 2023-05-26 NOTE — Telephone Encounter (Signed)
 Patient is returning a a phone call that she missed.

## 2023-05-26 NOTE — Telephone Encounter (Signed)
 Patient's niece called in regards to message below. Advised that medication was sent in and to keep appointment scheduled for Wednesday.  She asked if the medication could be sent to [facility]?

## 2023-05-26 NOTE — Progress Notes (Signed)
Meloxicam placed

## 2023-05-26 NOTE — Telephone Encounter (Signed)
 Spoke to patient's niece. See other phone note.

## 2023-05-26 NOTE — Telephone Encounter (Signed)
 Meloxicam  placed at [facility]

## 2023-05-27 NOTE — Progress Notes (Unsigned)
    Ben Jackson D.[name] [address] [place] [address] Phone: [phone]   Assessment and Plan:     There are no diagnoses linked to this encounter.  ***   Pertinent previous records reviewed include ***    Follow Up: ***     Subjective:   I, Melissa Cervantes, am serving as a Neurosurgeon for Doctor Ulysees Gander   Chief Complaint: R hip pain    HPI:    03/31/23 Patient is a 83 year old female with hip pain. Patient states right hip pain for weeks. Anterior groin pain that feels likes its going to give way. Pain is the most in the am. Motrin for the pain and that helps when she does take it. She takes it at night and sleeps on the side. Glute pain that radiates down the leg when she wakes it feels numb . Pain when sitting .   05/06/2023 Patient states that the shot only lasted a little while and the pain is back . Antalgic gait, she did do a lot more walking, decreased ROM   05/28/2023 Patients states    Relevant Historical Information: Hydrocephalus, CPPD  Additional pertinent review of systems negative.   Current Outpatient Medications:    albuterol  (VENTOLIN  HFA) 108 (90 Base) MCG/ACT inhaler, INHALE 1-2 PUFFS BY MOUTH EVERY 6 HOURS AS NEEDED FOR WHEEZE OR SHORTNESS OF BREATH, Disp: 8.5 each, Rfl: 3   amphetamine -dextroamphetamine  (ADDERALL) 15 MG tablet, Take 1 tablet by mouth 2 (two) times daily., Disp: 60 tablet, Rfl: 0   amphetamine -dextroamphetamine  (ADDERALL) 15 MG tablet, Take 1 tablet by mouth 2 (two) times daily., Disp: 60 tablet, Rfl: 0   amphetamine -dextroamphetamine  (ADDERALL) 15 MG tablet, Take 1 tablet by mouth 2 (two) times daily., Disp: 60 tablet, Rfl: 0   clonazePAM (KLONOPIN) 0.5 MG tablet, Take 0.5 mg by mouth at bedtime., Disp: , Rfl:    fluticasone  (FLONASE ) 50 MCG/ACT nasal spray, , Disp: , Rfl:    Fluticasone  Furoate (ARNUITY ELLIPTA ) 100 MCG/ACT AEPB, INHALE 1 PUFF BY MOUTH EVERY DAY, Disp: 30 each, Rfl:  0   lisinopril  (ZESTRIL ) 2.5 MG tablet, Take 1 tablet (2.5 mg total) by mouth daily., Disp: 90 tablet, Rfl: 0   meloxicam  (MOBIC ) 15 MG tablet, Take 1 tablet (15 mg total) by mouth daily., Disp: 30 tablet, Rfl: 0   methylPREDNISolone  (MEDROL  DOSEPAK) 4 MG TBPK tablet, Take 6 tablets on day 1.  Take 5 tablets on day 2.  Take 4 tablets on day 3.  Take 3 tablets on day 4.  Take 2 tablets on day 5.  Take 1 tablet on day 6., Disp: 21 tablet, Rfl: 0   montelukast  (SINGULAIR ) 10 MG tablet, TAKE 1 TABLET BY MOUTH EVERYDAY AT BEDTIME, Disp: 90 tablet, Rfl: 0   simvastatin  (ZOCOR ) 10 MG tablet, Take 1 tablet (10 mg total) by mouth at bedtime., Disp: 90 tablet, Rfl: 0   triamcinolone  cream (KENALOG ) 0.1 %, APPLY TO AFFECTED AREA TWICE A DAY, Disp: 30 g, Rfl: 0   Objective:     There were no vitals filed for this visit.    There is no height or weight on file to calculate BMI.    Physical Exam:    ***   Electronically signed by:  Marshall Skeeter D[name] 7:42 AM 05/27/23

## 2023-05-28 ENCOUNTER — Ambulatory Visit (INDEPENDENT_AMBULATORY_CARE_PROVIDER_SITE_OTHER): Admitting: Sports Medicine

## 2023-05-28 ENCOUNTER — Other Ambulatory Visit (HOSPITAL_BASED_OUTPATIENT_CLINIC_OR_DEPARTMENT_OTHER): Payer: Self-pay

## 2023-05-28 VITALS — BP 120/78 | HR 82 | Ht 61.0 in | Wt 119.0 lb

## 2023-05-28 DIAGNOSIS — M25561 Pain in right knee: Secondary | ICD-10-CM | POA: Diagnosis not present

## 2023-05-28 DIAGNOSIS — M79604 Pain in right leg: Secondary | ICD-10-CM

## 2023-05-28 DIAGNOSIS — M25551 Pain in right hip: Secondary | ICD-10-CM

## 2023-05-28 DIAGNOSIS — G8929 Other chronic pain: Secondary | ICD-10-CM

## 2023-05-28 DIAGNOSIS — M1611 Unilateral primary osteoarthritis, right hip: Secondary | ICD-10-CM | POA: Diagnosis not present

## 2023-05-28 MED ORDER — LISINOPRIL 2.5 MG PO TABS
2.5000 mg | ORAL_TABLET | Freq: Every day | ORAL | 0 refills | Status: DC
  Filled 2023-05-28: qty 90, 90d supply, fill #0

## 2023-05-28 NOTE — Patient Instructions (Signed)
 Take meloxicam  for 3 weeks  Tylenol [phone] mg 2-3 times a day for pain relief  Rest for 1 week and then gradually introduce exercises Voltaren gel over areas of pain  2 week follow up

## 2023-05-29 ENCOUNTER — Encounter: Admitting: Physical Therapy

## 2023-06-02 ENCOUNTER — Encounter: Admitting: Physical Therapy

## 2023-06-05 ENCOUNTER — Encounter: Admitting: Physical Therapy

## 2023-06-05 ENCOUNTER — Other Ambulatory Visit (HOSPITAL_BASED_OUTPATIENT_CLINIC_OR_DEPARTMENT_OTHER): Payer: Self-pay

## 2023-06-06 ENCOUNTER — Other Ambulatory Visit (HOSPITAL_BASED_OUTPATIENT_CLINIC_OR_DEPARTMENT_OTHER): Payer: Self-pay

## 2023-06-07 ENCOUNTER — Other Ambulatory Visit (HOSPITAL_BASED_OUTPATIENT_CLINIC_OR_DEPARTMENT_OTHER): Payer: Self-pay

## 2023-06-07 MED ORDER — CLONAZEPAM 0.5 MG PO TABS
0.5000 mg | ORAL_TABLET | Freq: Two times a day (BID) | ORAL | 3 refills | Status: DC | PRN
  Filled 2023-06-07: qty 60, 30d supply, fill #0
  Filled 2023-07-16: qty 60, 30d supply, fill #1
  Filled 2023-09-14: qty 60, 30d supply, fill #2
  Filled 2023-09-15: qty 60, 30d supply, fill #0
  Filled 2023-11-25 – 2023-11-26 (×2): qty 60, 30d supply, fill #1

## 2023-06-09 ENCOUNTER — Other Ambulatory Visit (HOSPITAL_BASED_OUTPATIENT_CLINIC_OR_DEPARTMENT_OTHER): Payer: Self-pay

## 2023-06-11 ENCOUNTER — Telehealth: Payer: Self-pay

## 2023-06-11 ENCOUNTER — Other Ambulatory Visit (HOSPITAL_BASED_OUTPATIENT_CLINIC_OR_DEPARTMENT_OTHER): Payer: Self-pay

## 2023-06-11 NOTE — Telephone Encounter (Signed)
 Copied from CRM [id number]. Topic: Clinical - Medication Question >> Jun 11, 2023  2:54 PM Melissa Cervantes wrote: Reason for CRM: Patient has requested a refill request for amphetamine -dextroamphetamine  (ADDERALL) 15 MG tablet which has an end date of 06/07/2023 after 60 doses; please submit RX refill to  [facility] [address] [place] [address] Phone: [phone] Fax: [phone] Patient has requested a follow up call after refill has been sent #[phone]

## 2023-06-11 NOTE — Telephone Encounter (Signed)
Okay for refill? Please advise

## 2023-06-12 ENCOUNTER — Other Ambulatory Visit (HOSPITAL_COMMUNITY): Payer: Self-pay

## 2023-06-12 ENCOUNTER — Other Ambulatory Visit: Payer: Self-pay | Admitting: Adult Health

## 2023-06-12 ENCOUNTER — Other Ambulatory Visit (HOSPITAL_BASED_OUTPATIENT_CLINIC_OR_DEPARTMENT_OTHER): Payer: Self-pay

## 2023-06-12 DIAGNOSIS — F909 Attention-deficit hyperactivity disorder, unspecified type: Secondary | ICD-10-CM

## 2023-06-12 MED ORDER — AMPHETAMINE-DEXTROAMPHETAMINE 15 MG PO TABS
15.0000 mg | ORAL_TABLET | Freq: Two times a day (BID) | ORAL | 0 refills | Status: DC
Start: 2023-06-12 — End: 2023-08-25
  Filled 2023-06-12: qty 60, 30d supply, fill #0

## 2023-06-12 MED ORDER — AMPHETAMINE-DEXTROAMPHETAMINE 15 MG PO TABS
15.0000 mg | ORAL_TABLET | Freq: Two times a day (BID) | ORAL | 0 refills | Status: DC
  Filled 2023-06-12 – 2023-07-25 (×2): qty 60, 30d supply, fill #0

## 2023-06-12 NOTE — Progress Notes (Signed)
 [name] D.[name] [address] [place] [address] Phone: [phone]   Assessment and Plan:     1. Primary osteoarthritis of right hip 2. Right hip pain -Chronic with exacerbation, subsequent visit - Today's visit is consistent with recurrent flare of right hip osteoarthritis.  Patient previously had significant "90%" relief after intra-articular CSI performed on 03/31/2023.  We discussed that we would like to wait a minimum of 3 months in between injections, so we elected to delay injection until follow-up visit - Start prednisone  Dosepak -Continue Tylenol 500 to 1000 mg tablets 2-3 times a day for day-to-day pain relief  -May use meloxicam  15 mg daily as needed for breakthrough pain after completing prednisone  Dosepak.  Recommend limiting chronic NSAIDs to 1 to dose per week  Pertinent previous records reviewed include none  Follow Up: 4 to 5 weeks for reevaluation.  Could consider repeat intra-articular CSI   Subjective:   I, Melissa Cervantes, am serving as a Neurosurgeon for Doctor Ulysees Gander   Chief Complaint: R hip pain    HPI:    03/31/23 Patient is a 83 year old female with hip pain. Patient states right hip pain for weeks. Anterior groin pain that feels likes its going to give way. Pain is the most in the am. Motrin for the pain and that helps when she does take it. She takes it at night and sleeps on the side. Glute pain that radiates down the leg when she wakes it feels numb . Pain when sitting .   05/06/2023 Patient states that the shot only lasted a little while and the pain is back . Antalgic gait, she did do a lot more walking, decreased ROM    05/28/2023 Patients states she is flared right now . She did some extra walking after PT and thinks the sitting in the car didn't help   06/13/2023 Patient states she feels better , but not all taken care of    Relevant Historical Information: Hydrocephalus, CPPD Additional  pertinent review of systems negative.   Current Outpatient Medications:    albuterol  (VENTOLIN  HFA) 108 (90 Base) MCG/ACT inhaler, INHALE 1-2 PUFFS BY MOUTH EVERY 6 HOURS AS NEEDED FOR WHEEZE OR SHORTNESS OF BREATH, Disp: 8.5 each, Rfl: 3   amphetamine -dextroamphetamine  (ADDERALL) 15 MG tablet, Take 1 tablet by mouth 2 (two) times daily. Fill 3/3, Disp: 60 tablet, Rfl: 0   amphetamine -dextroamphetamine  (ADDERALL) 15 MG tablet, Take 1 tablet by mouth 2 (two) times daily. Fill 2/3, Disp: 60 tablet, Rfl: 0   amphetamine -dextroamphetamine  (ADDERALL) 15 MG tablet, Take 1 tablet by mouth 2 (two) times daily. Fill 1/3, Disp: 60 tablet, Rfl: 0   clonazePAM  (KLONOPIN ) 0.5 MG tablet, Take 1 tablet (0.5 mg total) by mouth 2 (two) times a day as needed (dystonic spasms)., Disp: 60 tablet, Rfl: 3   fluticasone  (FLONASE ) 50 MCG/ACT nasal spray, , Disp: , Rfl:    Fluticasone  Furoate (ARNUITY ELLIPTA ) 100 MCG/ACT AEPB, INHALE 1 PUFF BY MOUTH EVERY DAY, Disp: 30 each, Rfl: 0   lisinopril  (ZESTRIL ) 2.5 MG tablet, Take 1 tablet (2.5 mg total) by mouth daily., Disp: 90 tablet, Rfl: 0   meloxicam  (MOBIC ) 15 MG tablet, Take 1 tablet (15 mg total) by mouth daily., Disp: 30 tablet, Rfl: 0   methylPREDNISolone  (MEDROL  DOSEPAK) 4 MG TBPK tablet, Take 6 tablets on day 1 then decrease by 1 tablet daily as directed on package., Disp: 21 tablet, Rfl: 0   montelukast  (  SINGULAIR ) 10 MG tablet, TAKE 1 TABLET BY MOUTH EVERYDAY AT BEDTIME, Disp: 90 tablet, Rfl: 0   simvastatin  (ZOCOR ) 10 MG tablet, Take 1 tablet (10 mg total) by mouth at bedtime., Disp: 90 tablet, Rfl: 0   Objective:     Vitals:   06/13/23 1059  BP: 118/78  Pulse: 80  SpO2: 100%  Weight: 120 lb (54.4 kg)  Height: 5\' 1"  (1.549 m)      Body mass index is 22.67 kg/m.    Physical Exam:    General: awake, alert, and oriented no acute distress, nontoxic Skin: no suspicious lesions or rashes Neuro:sensation intact distally with no deficits, normal muscle  tone, no atrophy, strength 5/5 in all tested lower ext groups Psych: normal mood and affect, speech clear   Right hip: No deformity, swelling or wasting ROM Flexion 75, ext 25, IR 30, ER 35 NTTP over the hip flexors, greater trochanter, gluteal musculature, si joint, lumbar spine Positive log roll with FROM Positive FABER Positive FADIR  Gait antalgic, favoring left leg    Electronically signed by:  Marshall Skeeter D[name] 11:57 AM 06/13/23

## 2023-06-13 ENCOUNTER — Ambulatory Visit (INDEPENDENT_AMBULATORY_CARE_PROVIDER_SITE_OTHER): Admitting: Sports Medicine

## 2023-06-13 ENCOUNTER — Other Ambulatory Visit (HOSPITAL_BASED_OUTPATIENT_CLINIC_OR_DEPARTMENT_OTHER): Payer: Self-pay

## 2023-06-13 ENCOUNTER — Other Ambulatory Visit (HOSPITAL_COMMUNITY): Payer: Self-pay

## 2023-06-13 VITALS — BP 118/78 | HR 80 | Ht 61.0 in | Wt 120.0 lb

## 2023-06-13 DIAGNOSIS — M1611 Unilateral primary osteoarthritis, right hip: Secondary | ICD-10-CM | POA: Diagnosis not present

## 2023-06-13 DIAGNOSIS — M25551 Pain in right hip: Secondary | ICD-10-CM | POA: Diagnosis not present

## 2023-06-13 MED ORDER — METHYLPREDNISOLONE 4 MG PO TBPK
ORAL_TABLET | ORAL | 0 refills | Status: DC
  Filled 2023-06-13 (×2): qty 21, 6d supply, fill #0

## 2023-06-13 NOTE — Patient Instructions (Signed)
 Prednisone  dos pak Tylenol [phone] mg 2-3 times a day for pain relief  After finishing prednisone  can start meloxicam  15 mg as needed 1-2 times per week  4-5 week follow up

## 2023-06-16 ENCOUNTER — Other Ambulatory Visit (HOSPITAL_COMMUNITY): Payer: Self-pay

## 2023-06-16 IMAGING — DX DG KNEE AP/LAT W/ SUNRISE*R*
3 series · 3 of 3 positions shown · non-contrast
Comparison: None.

CLINICAL DATA: RIGHT knee pain.  No known injury.

EXAM:
RIGHT KNEE 3 VIEWS

[knee ap]
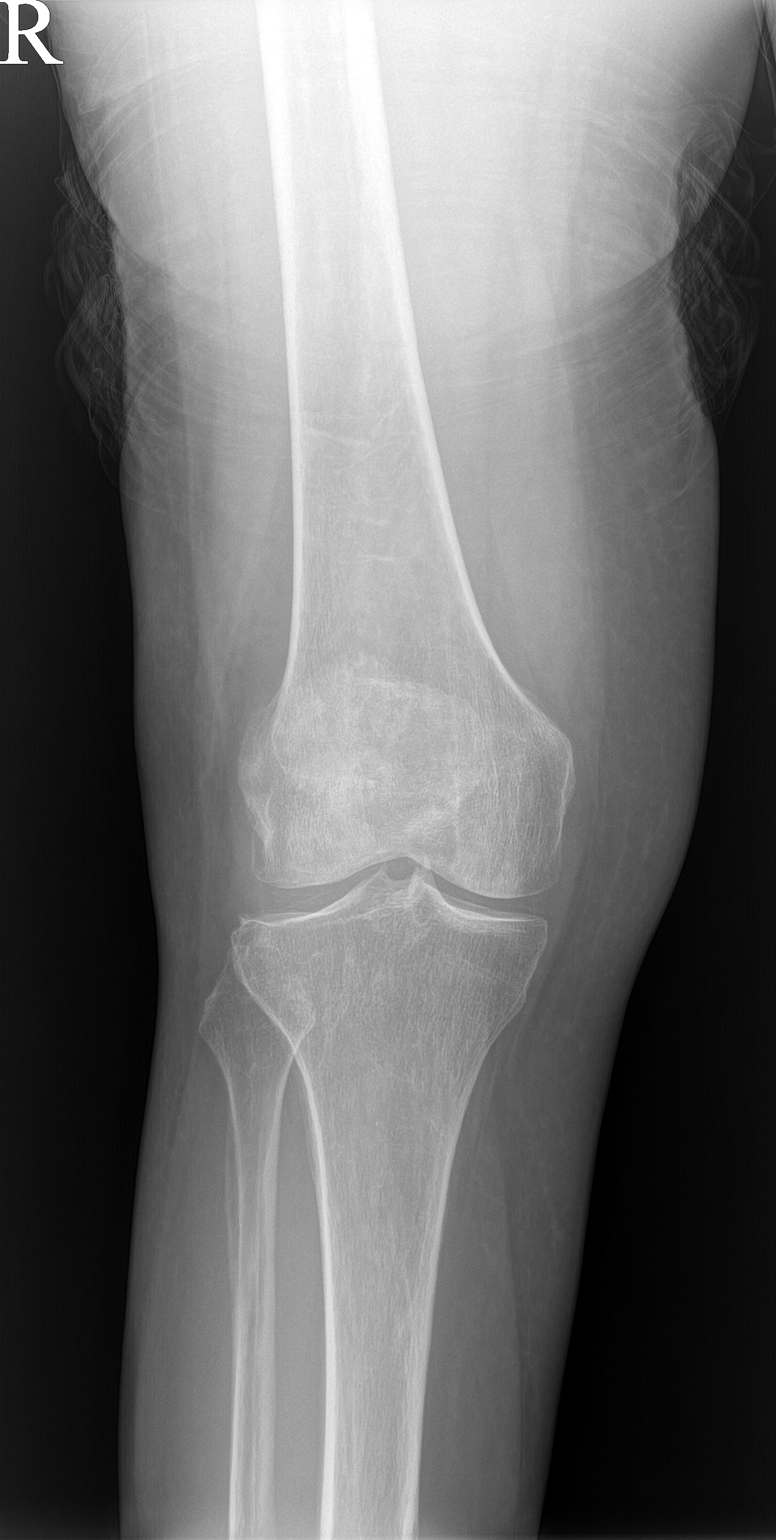

[knee lat]
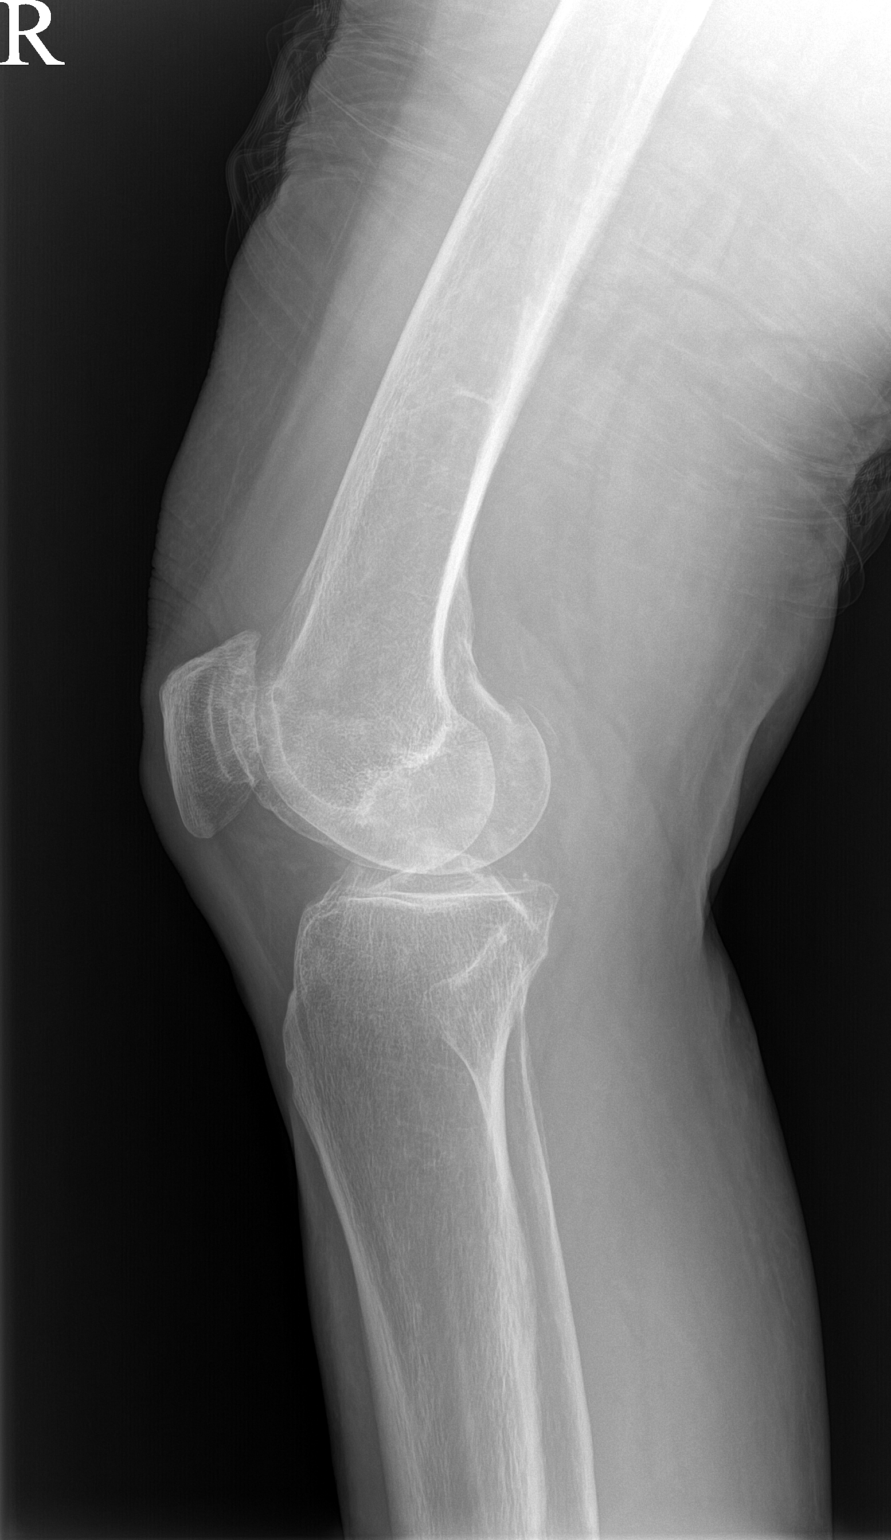

[patella]
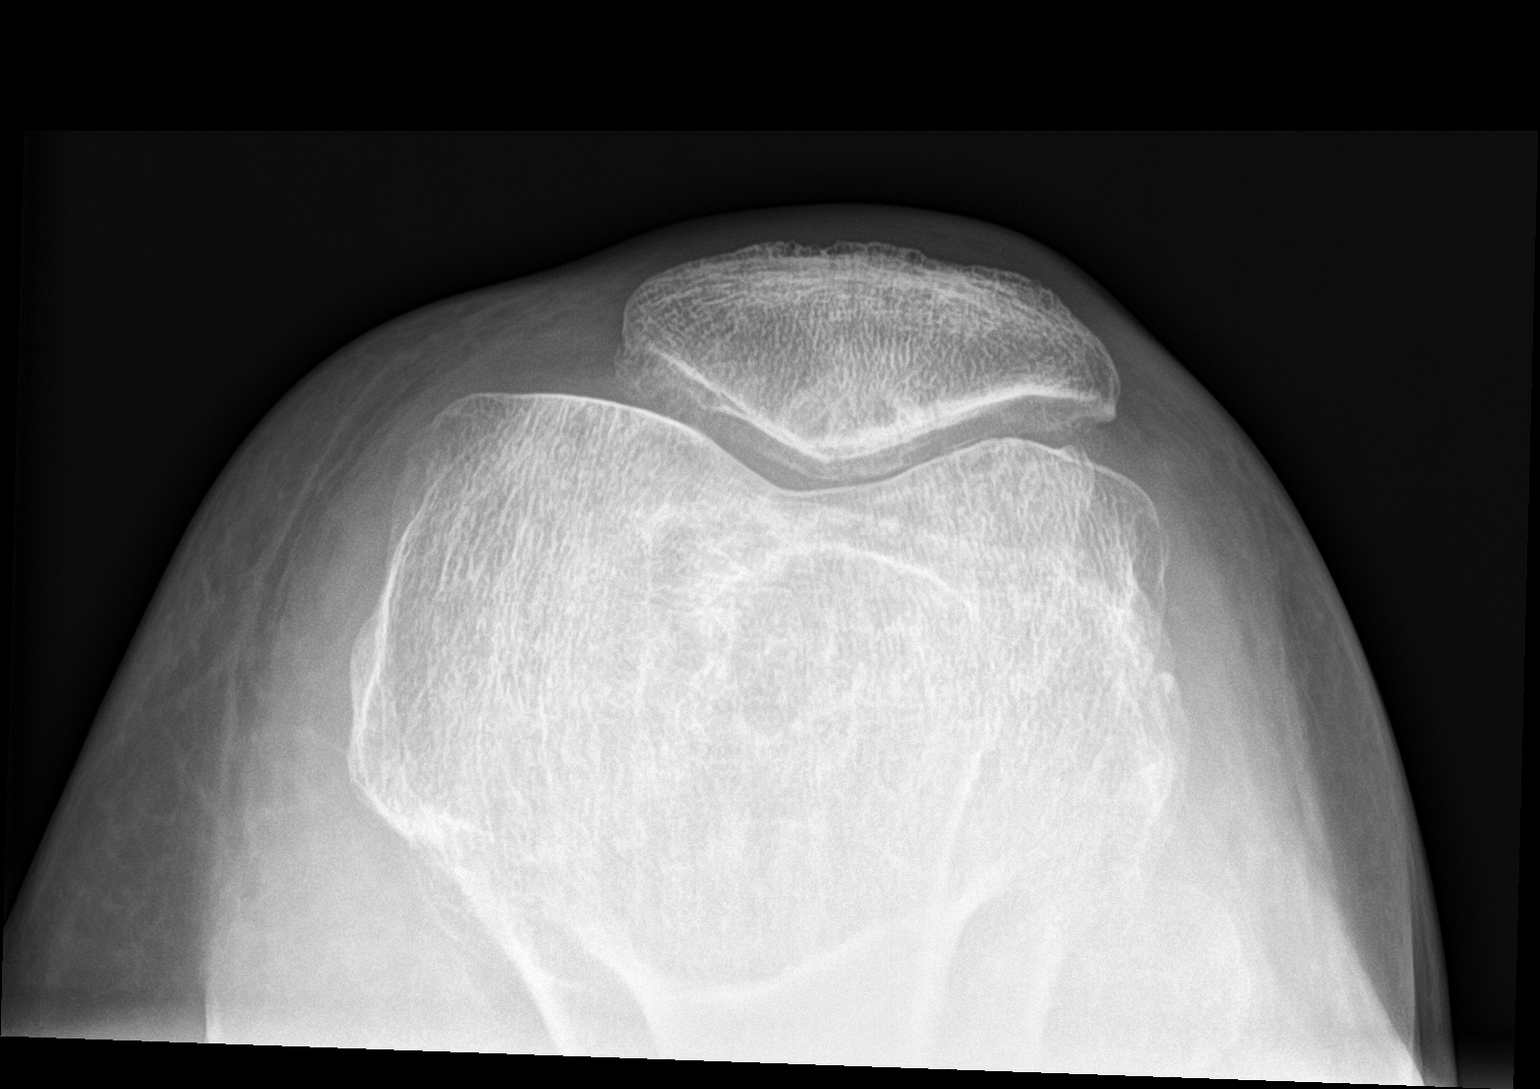

[3 of 3 positions shown; findings below may reference images not displayed]

FINDINGS: Osseous alignment is normal. No fracture line or displaced fracture
fragment seen. No acute-appearing cortical irregularity or osseous
lesion. Mild degenerative change at the patellofemoral joint space.
Medial and lateral compartments appear well preserved in height,
however, faint calcific densities are seen within the medial and
lateral compartments indicating chondrocalcinosis.

Probable small joint effusion within the suprapatellar bursa.
Superficial soft tissues about the knee are unremarkable.
IMPRESSION: 1. No acute appearing osseous abnormality.
2. Mild degenerative change at the patellofemoral joint space.
Chondrocalcinosis within the medial and lateral compartments
indicating underlying CPPD.
3. Probable small joint effusion.

## 2023-06-19 ENCOUNTER — Telehealth: Payer: Self-pay | Admitting: *Deleted

## 2023-06-19 NOTE — Telephone Encounter (Signed)
 Spoke to Melissa Cervantes and she stated her potasium levels were elevated. Melissa Cervantes stated that surgeon advised that she have it rechecked before her appt. Melissa Cervantes stated she lives in [place] and want the labs drawn here. I advised Melissa Cervantes that I will ask Randel Buss to see what he states. Melissa Cervantes verbalized understanding.

## 2023-06-19 NOTE — Telephone Encounter (Signed)
 Copied from CRM [id number]. Topic: Clinical - Request for Lab/Test Order >> Jun 19, 2023  4:11 PM Melissa Cervantes wrote: Reason for CRM: Reason for CRM: Patient is calling because before her surgey on 5/28 they told her to get her potassium levels check and to contact her doctor for an order to she can schedule her appointment

## 2023-06-20 ENCOUNTER — Other Ambulatory Visit: Payer: Self-pay | Admitting: Adult Health

## 2023-06-20 DIAGNOSIS — E875 Hyperkalemia: Secondary | ICD-10-CM

## 2023-06-20 NOTE — Telephone Encounter (Signed)
 Patient notified of update  and verbalized understanding.

## 2023-06-23 ENCOUNTER — Other Ambulatory Visit (HOSPITAL_BASED_OUTPATIENT_CLINIC_OR_DEPARTMENT_OTHER): Payer: Self-pay

## 2023-06-23 MED FILL — Simvastatin Tab 10 MG: ORAL | 90 days supply | Qty: 90 | Fill #0 | Status: AC

## 2023-06-24 ENCOUNTER — Other Ambulatory Visit (INDEPENDENT_AMBULATORY_CARE_PROVIDER_SITE_OTHER)

## 2023-06-24 ENCOUNTER — Ambulatory Visit: Payer: Self-pay | Admitting: Adult Health

## 2023-06-24 DIAGNOSIS — E875 Hyperkalemia: Secondary | ICD-10-CM | POA: Diagnosis not present

## 2023-06-24 LAB — POTASSIUM: Potassium: 4.4 meq/L (ref 3.5–5.1)

## 2023-06-26 ENCOUNTER — Other Ambulatory Visit (HOSPITAL_BASED_OUTPATIENT_CLINIC_OR_DEPARTMENT_OTHER): Payer: Self-pay

## 2023-07-02 HISTORY — PX: BRAIN SURGERY: SHX531

## 2023-07-07 ENCOUNTER — Telehealth: Payer: Self-pay

## 2023-07-07 NOTE — Transitions of Care (Post Inpatient/ED Visit) (Signed)
   07/07/2023  Name: Melissa Cervantes MRN: [id number] DOB: 1940/09/04  Today's TOC FU Call Status: Today's TOC FU Call Status:: Unsuccessful Call (1st Attempt) Unsuccessful Call (1st Attempt) Date: 07/07/23  Attempted to reach the patient regarding the most recent Inpatient/ED visit.  Follow Up Plan: Additional outreach attempts will be made to reach the patient to complete the Transitions of Care (Post Inpatient/ED visit) call.   Signature  Germain Kohler, CMA (AAMA)  [facility] [phone]

## 2023-07-09 ENCOUNTER — Other Ambulatory Visit (HOSPITAL_BASED_OUTPATIENT_CLINIC_OR_DEPARTMENT_OTHER): Payer: Self-pay

## 2023-07-09 MED ORDER — METHOCARBAMOL 500 MG PO TABS
500.0000 mg | ORAL_TABLET | Freq: Four times a day (QID) | ORAL | 0 refills | Status: DC
  Filled 2023-07-09: qty 40, 10d supply, fill #0

## 2023-07-16 ENCOUNTER — Other Ambulatory Visit (HOSPITAL_BASED_OUTPATIENT_CLINIC_OR_DEPARTMENT_OTHER): Payer: Self-pay

## 2023-07-17 ENCOUNTER — Other Ambulatory Visit (HOSPITAL_BASED_OUTPATIENT_CLINIC_OR_DEPARTMENT_OTHER): Payer: Self-pay

## 2023-07-17 ENCOUNTER — Other Ambulatory Visit: Payer: Self-pay

## 2023-07-17 MED ORDER — METHYLPREDNISOLONE 4 MG PO TBPK
ORAL_TABLET | ORAL | 0 refills | Status: DC
  Filled 2023-07-17 (×2): qty 21, 6d supply, fill #0

## 2023-07-17 MED ORDER — METHOCARBAMOL 500 MG PO TABS
500.0000 mg | ORAL_TABLET | Freq: Four times a day (QID) | ORAL | 0 refills | Status: DC
  Filled 2023-07-17: qty 40, 10d supply, fill #0

## 2023-07-24 ENCOUNTER — Encounter: Payer: Self-pay | Admitting: Adult Health

## 2023-07-25 ENCOUNTER — Other Ambulatory Visit (HOSPITAL_BASED_OUTPATIENT_CLINIC_OR_DEPARTMENT_OTHER): Payer: Self-pay

## 2023-07-25 ENCOUNTER — Other Ambulatory Visit (HOSPITAL_COMMUNITY): Payer: Self-pay

## 2023-07-25 NOTE — Telephone Encounter (Signed)
 FYI

## 2023-08-11 ENCOUNTER — Other Ambulatory Visit (HOSPITAL_BASED_OUTPATIENT_CLINIC_OR_DEPARTMENT_OTHER): Payer: Self-pay

## 2023-08-11 MED ORDER — DICLOFENAC SODIUM 75 MG PO TBEC
75.0000 mg | DELAYED_RELEASE_TABLET | Freq: Two times a day (BID) | ORAL | 0 refills | Status: DC
  Filled 2023-08-11: qty 60, 30d supply, fill #0

## 2023-08-12 ENCOUNTER — Other Ambulatory Visit (HOSPITAL_BASED_OUTPATIENT_CLINIC_OR_DEPARTMENT_OTHER): Payer: Self-pay

## 2023-08-20 NOTE — Progress Notes (Signed)
 Sent message, via epic in basket, requesting orders in epic from Careers adviser.

## 2023-08-24 ENCOUNTER — Other Ambulatory Visit: Payer: Self-pay | Admitting: Adult Health

## 2023-08-24 ENCOUNTER — Encounter: Payer: Self-pay | Admitting: Adult Health

## 2023-08-24 DIAGNOSIS — I10 Essential (primary) hypertension: Secondary | ICD-10-CM

## 2023-08-24 DIAGNOSIS — F909 Attention-deficit hyperactivity disorder, unspecified type: Secondary | ICD-10-CM

## 2023-08-25 ENCOUNTER — Other Ambulatory Visit: Payer: Self-pay

## 2023-08-25 ENCOUNTER — Other Ambulatory Visit (HOSPITAL_COMMUNITY): Payer: Self-pay

## 2023-08-25 MED ORDER — LISINOPRIL 2.5 MG PO TABS
2.5000 mg | ORAL_TABLET | Freq: Every day | ORAL | 0 refills | Status: DC
  Filled 2023-08-25 – 2023-08-26 (×2): qty 90, 90d supply, fill #0

## 2023-08-26 ENCOUNTER — Other Ambulatory Visit (HOSPITAL_BASED_OUTPATIENT_CLINIC_OR_DEPARTMENT_OTHER): Payer: Self-pay

## 2023-08-26 ENCOUNTER — Other Ambulatory Visit (HOSPITAL_COMMUNITY): Payer: Self-pay

## 2023-08-26 MED ORDER — AMPHETAMINE-DEXTROAMPHETAMINE 15 MG PO TABS
15.0000 mg | ORAL_TABLET | Freq: Two times a day (BID) | ORAL | 0 refills | Status: DC
  Filled 2023-08-26: qty 60, 30d supply, fill #0

## 2023-08-26 NOTE — Progress Notes (Signed)
 COVID Vaccine received:  []  No [x]  Yes Date of any COVID positive Test in last 90 days:  PCP - Darleene Shape, NP  Cardiologist - Carlin Hoe, MD  [facility], [phone]   (LOV 04-29-23) Neurosurgery- Gustav Quan, NP Neurology- Cinderella Jeans, MD   Chest x-ray -08-24-2020  2v  Epic  EKG -  04-29-23  at [facility]   CE   request Stress Test -  ECHO -  Cardiac Cath -  CT Coronary Calcium  score:   Pacemaker / ICD device [x]  No []  Yes   Spinal Cord Stimulator:[x]  No []  Yes       History of Sleep Apnea? [x]  No []  Yes   CPAP used?- [x]  No []  Yes    Does the patient monitor blood sugar?   [x]  N/A   []  No []  Yes  Patient has: [x]  NO Hx DM   []  Pre-DM   []  DM1  []   DM2  Blood Thinner / Instructions:  none Aspirin Instructions:  none  ERAS Protocol Ordered: []  No  [x]  Yes PRE-SURGERY [x]  ENSURE  []  G2   Patient is to be NPO after: 0815  Dental hx: []  Dentures:  []  N/A      []  Bridge or Partial:                   []  Loose or Damaged teeth:   Comments: Patient was given the 5 CHG shower / bath instructions for THA surgery along with 2 bottles of the CHG soap. Patient will start this on:   09-05-23           Activity level: Able to walk up 2 flights of stairs without becoming significantly short of breath or having chest pain?  []  No   []    Yes   Anesthesia review: recent VP shunt insertion (07-02-23 at [facility]) for NPH (normal pressure hydrocephalus), cervical dystonia / torticollis- get Botox injections, ADHD, Meniere's, HTN, HOH-   Patient denies any S&S of respiratory illness or Covid - no shortness of breath, fever, cough or chest pain at PAT appointment.  Patient verbalized understanding and agreement to the Pre-Surgical Instructions that were given to them at this PAT appointment. Patient was also educated of the need to review these PAT instructions again prior to her surgery.I reviewed the appropriate phone numbers to call if they have any and questions or concerns.

## 2023-08-26 NOTE — Patient Instructions (Signed)
 SURGICAL WAITING ROOM VISITATION Patients having surgery or a procedure may have no more than 2 support people in the waiting area - these visitors may rotate in the visitor waiting room.   If the patient needs to stay at the hospital during part of their recovery, the visitor guidelines for inpatient rooms apply.  PRE-OP VISITATION  Pre-op nurse will coordinate an appropriate time for 1 support person to accompany the patient in pre-op.  This support person may not rotate.  This visitor will be contacted when the time is appropriate for the visitor to come back in the pre-op area.  Please refer to the [facility] website for the visitor guidelines for Inpatients (after your surgery is over and you are in a regular room).  You are not required to quarantine at this time prior to your surgery. However, you must do this: Hand Hygiene often Do NOT share personal items Notify your provider if you are in close contact with someone who has COVID or you develop fever 100.4 or greater, new onset of sneezing, cough, sore throat, shortness of breath or body aches.  If you test positive for Covid or have been in contact with anyone that has tested positive in the last 10 days please notify you surgeon.    Your procedure is scheduled on:  Tuesday  September 09, 2023  Report to [facility] Main Entrance: [address] entrance where the [employer] is available.   Report to admitting at: 08:45    AM  Call this number if you have any questions or problems the morning of surgery [phone]  Do not eat food after Midnight the night prior to your surgery/procedure.  After Midnight you may have the following liquids until    08:15 AM DAY OF SURGERY  Clear Liquid Diet Water Black Coffee (sugar ok, NO MILK/CREAM OR CREAMERS)  Tea (sugar ok, NO MILK/CREAM OR CREAMERS) regular and decaf                             Plain Jell-O  with no fruit (NO RED)                                           Fruit  ices (not with fruit pulp, NO RED)                                     Popsicles (NO RED)                                                                  Juice: NO CITRUS JUICES: only apple, WHITE grape, WHITE cranberry Sports drinks like Gatorade or Powerade (NO RED)                   The day of surgery:  Drink ONE (1) Pre-Surgery Clear Ensure at  08:15 AM the morning of surgery. Drink in one sitting. Do not sip.  This drink was given to you during your hospital pre-op appointment visit. Nothing else to drink  after completing the Pre-Surgery Clear Ensure : No candy, chewing gum or throat lozenges.    FOLLOW ANY ADDITIONAL PRE OP INSTRUCTIONS YOU RECEIVED FROM YOUR SURGEON'S OFFICE!!!   Oral Hygiene is also important to reduce your risk of infection.        Remember - BRUSH YOUR TEETH THE MORNING OF SURGERY WITH YOUR REGULAR TOOTHPASTE  Do NOT smoke after Midnight the night before surgery.  STOP TAKING all Vitamins, Herbs and supplements 1 week before your surgery.   Take ONLY these medicines the morning of surgery with A SIP OF WATER: you may take EITHER Tylenol OR Hydrocodone APAP if needed for pain. You may use your inhalers and nasal spray if needed. Please bring your Albuterol  inhaler with you on the day of surgery.  DO NOT TAKE ADDERALL on the morning of your surgery.                    You may not have any metal on your body including hair pins, jewelry, and body piercing  Do not wear make-up, lotions, powders, perfumes or deodorant  Do not wear nail polish including gel and S&S, artificial / acrylic nails, or any other type of covering on natural nails including finger and toenails. If you have artificial nails, gel coating, etc., that needs to be removed by a nail salon, Please have this removed prior to surgery. Not doing so may mean that your surgery could be cancelled or delayed if the Surgeon or anesthesia staff feels like they are unable to monitor you safely.   Do  not shave 48 hours prior to surgery to avoid nicks in your skin which may contribute to postoperative infections.    Contacts, Hearing Aids, dentures or bridgework may not be worn into surgery. DENTURES WILL BE REMOVED PRIOR TO SURGERY PLEASE DO NOT APPLY Poly grip OR ADHESIVES!!!  You may bring a small overnight bag with you on the day of surgery, only pack items that are not valuable. [facility] IS NOT RESPONSIBLE   FOR VALUABLES THAT ARE LOST OR STOLEN.   Do not bring your home medications to the hospital. The Pharmacy will dispense medications listed on your medication list to you during your admission in the Hospital.  Please read over the following fact sheets you were given: IF YOU HAVE QUESTIONS ABOUT YOUR PRE-OP INSTRUCTIONS, PLEASE CALL [phone].     Pre-operative 5 CHG Bath Instructions   You can play a key role in reducing the risk of infection after surgery. Your skin needs to be as free of germs as possible. You can reduce the number of germs on your skin by washing with CHG (chlorhexidine gluconate) soap before surgery. CHG is an antiseptic soap that kills germs and continues to kill germs even after washing.   DO NOT use if you have an allergy to chlorhexidine/CHG or antibacterial soaps. If your skin becomes reddened or irritated, stop using the CHG and notify one of our RNs at [phone]  Please shower with the CHG soap starting 4 days before surgery using the following schedule: START SHOWERS ON   FRIDAY  September 05, 2023  Please keep in mind the following:  DO NOT shave, including legs and underarms, starting the day of your first shower.   You may shave your face at any point before/day of surgery.   Place clean sheets on your bed the day you start using CHG soap. Use a clean washcloth (not  used since being washed) for each shower. DO NOT sleep with pets once you start using the CHG.   CHG Shower Instructions:  If you choose to wash your hair and private area, wash first with your normal shampoo/soap.  After you use shampoo/soap, rinse your hair and body thoroughly to remove shampoo/soap residue.  Turn the water OFF and apply about 3 tablespoons (45 ml) of CHG soap to a CLEAN washcloth.  Apply CHG soap ONLY FROM YOUR NECK DOWN TO YOUR TOES (washing for 3-5 minutes)  DO NOT use CHG soap on face, private areas, open wounds, or sores.  Pay special attention to the area where your surgery is being performed.  If you are having back surgery, having someone wash your back for you may be helpful.  Wait 2 minutes after CHG soap is applied, then you may rinse off the CHG soap.  Pat dry with a clean towel  Put on clean clothes/pajamas   If you choose to wear lotion, please use ONLY the CHG-compatible lotions on the back of this paper.     Additional instructions for the day of surgery: DO NOT APPLY any lotions, deodorants, cologne, or perfumes.   Put on clean/comfortable clothes.  Brush your teeth.  Ask your nurse before applying any prescription medications to the skin.      CHG Compatible Lotions   Aveeno Moisturizing lotion  Cetaphil Moisturizing Cream  Cetaphil Moisturizing Lotion  Clairol Herbal Essence Moisturizing Lotion, Dry Skin  Clairol Herbal Essence Moisturizing Lotion, Extra Dry Skin  Clairol Herbal Essence Moisturizing Lotion, Normal Skin  Curel Age Defying Therapeutic Moisturizing Lotion with Alpha Hydroxy  Curel Extreme Care Body Lotion  Curel Soothing Hands Moisturizing Hand Lotion  Curel Therapeutic Moisturizing Cream, Fragrance-Free  Curel Therapeutic Moisturizing Lotion, Fragrance-Free  Curel Therapeutic Moisturizing Lotion, Original Formula  Eucerin Daily Replenishing Lotion  Eucerin Dry Skin Therapy Plus Alpha Hydroxy Crme  Eucerin Dry Skin  Therapy Plus Alpha Hydroxy Lotion  Eucerin Original Crme  Eucerin Original Lotion  Eucerin Plus Crme Eucerin Plus Lotion  Eucerin TriLipid Replenishing Lotion  Keri Anti-Bacterial Hand Lotion  Keri Deep Conditioning Original Lotion Dry Skin Formula Softly Scented  Keri Deep Conditioning Original Lotion, Fragrance Free Sensitive Skin Formula  Keri Lotion Fast Absorbing Fragrance Free Sensitive Skin Formula  Keri Lotion Fast Absorbing Softly Scented Dry Skin Formula  Keri Original Lotion  Keri Skin Renewal Lotion Keri Silky Smooth Lotion  Keri Silky Smooth Sensitive Skin Lotion  Nivea Body Creamy Conditioning Oil  Nivea Body Extra Enriched Lotion  Nivea Body Original Lotion  Nivea Body Sheer Moisturizing Lotion Nivea Crme  Nivea Skin Firming Lotion  NutraDerm 30 Skin Lotion  NutraDerm Skin Lotion  NutraDerm Therapeutic Skin Cream  NutraDerm Therapeutic Skin Lotion  ProShield Protective Hand Cream  Provon moisturizing lotion   FAILURE TO FOLLOW THESE INSTRUCTIONS MAY RESULT IN THE CANCELLATION OF YOUR SURGERY  PATIENT SIGNATURE_________________________________  NURSE SIGNATURE__________________________________  ________________________________________________________________________        Melissa Cervantes    An incentive spirometer is a tool that can help keep your lungs clear and active. This tool measures how well you are filling your lungs with each breath.  Taking long deep breaths may help reverse or decrease the chance of developing breathing (pulmonary) problems (especially infection) following: A long period of time when you are unable to move or be active. BEFORE THE PROCEDURE  If the spirometer includes an indicator to show your best effort, your nurse or respiratory therapist will set it to a desired goal. If possible, sit up straight or lean slightly forward. Try not to slouch. Hold the incentive spirometer in an upright position. INSTRUCTIONS FOR USE   Sit on the edge of your bed if possible, or sit up as far as you can in bed or on a chair. Hold the incentive spirometer in an upright position. Breathe out normally. Place the mouthpiece in your mouth and seal your lips tightly around it. Breathe in slowly and as deeply as possible, raising the piston or the ball toward the top of the column. Hold your breath for 3-5 seconds or for as long as possible. Allow the piston or ball to fall to the bottom of the column. Remove the mouthpiece from your mouth and breathe out normally. Rest for a few seconds and repeat Steps 1 through 7 at least 10 times every 1-2 hours when you are awake. Take your time and take a few normal breaths between deep breaths. The spirometer may include an indicator to show your best effort. Use the indicator as a goal to work toward during each repetition. After each set of 10 deep breaths, practice coughing to be sure your lungs are clear. If you have an incision (the cut made at the time of surgery), support your incision when coughing by placing a pillow or rolled up towels firmly against it. Once you are able to get out of bed, walk around indoors and cough well. You may stop using the incentive spirometer when instructed by your caregiver.  RISKS AND COMPLICATIONS Take your time so you do not get dizzy or light-headed. If you are in pain, you may need to take or ask for pain medication before doing incentive spirometry. It is harder to take a deep breath if you are having pain. AFTER USE Rest and breathe slowly and easily. It can be helpful to keep track of a log of your progress. Your caregiver can provide you with a simple table to help with this. If you are using the spirometer at home, follow these instructions: SEEK MEDICAL CARE IF:  You are having difficultly using the spirometer. You have trouble using the spirometer as often as instructed. Your pain medication is not giving enough relief while using the  spirometer. You develop fever of 100.5 F (38.1 C) or higher.                                                                                                    SEEK IMMEDIATE MEDICAL CARE IF:  You cough up bloody sputum that had not been present before. You develop fever of 102 F (38.9 C) or greater. You develop worsening pain at or near the incision site. MAKE SURE YOU:  Understand these instructions. Will watch your  condition. Will get help right away if you are not doing well or get worse. Document Released: 06/03/2006 Document Revised: 04/15/2011 Document Reviewed: 08/04/2006 [facility] Patient Information 2014 [place], [place].        WHAT IS A BLOOD TRANSFUSION? Blood Transfusion Information  A transfusion is the replacement of blood or some of its parts. Blood is made up of multiple cells which provide different functions. Red blood cells carry oxygen and are used for blood loss replacement. White blood cells fight against infection. Platelets control bleeding. Plasma helps clot blood. Other blood products are available for specialized needs, such as hemophilia or other clotting disorders. BEFORE THE TRANSFUSION  Who gives blood for transfusions?  Healthy volunteers who are fully evaluated to make sure their blood is safe. This is blood bank blood. Transfusion therapy is the safest it has ever been in the practice of medicine. Before blood is taken from a donor, a complete history is taken to make sure that person has no history of diseases nor engages in risky social behavior (examples are intravenous drug use or sexual activity with multiple partners). The donor's travel history is screened to minimize risk of transmitting infections, such as malaria. The donated blood is tested for signs of infectious diseases, such as HIV and hepatitis. The blood is then tested to be sure it is compatible with you in order to minimize the chance of a transfusion reaction. If you or a  relative donates blood, this is often done in anticipation of surgery and is not appropriate for emergency situations. It takes many days to process the donated blood. RISKS AND COMPLICATIONS Although transfusion therapy is very safe and saves many lives, the main dangers of transfusion include:  Getting an infectious disease. Developing a transfusion reaction. This is an allergic reaction to something in the blood you were given. Every precaution is taken to prevent this. The decision to have a blood transfusion has been considered carefully by your caregiver before blood is given. Blood is not given unless the benefits outweigh the risks. AFTER THE TRANSFUSION Right after receiving a blood transfusion, you will usually feel much better and more energetic. This is especially true if your red blood cells have gotten low (anemic). The transfusion raises the level of the red blood cells which carry oxygen, and this usually causes an energy increase. The nurse administering the transfusion will monitor you carefully for complications. HOME CARE INSTRUCTIONS  No special instructions are needed after a transfusion. You may find your energy is better. Speak with your caregiver about any limitations on activity for underlying diseases you may have. SEEK MEDICAL CARE IF:  Your condition is not improving after your transfusion. You develop redness or irritation at the intravenous (IV) site. SEEK IMMEDIATE MEDICAL CARE IF:  Any of the following symptoms occur over the next 12 hours: Shaking chills. You have a temperature by mouth above 102 F (38.9 C), not controlled by medicine. Chest, back, or muscle pain. People around you feel you are not acting correctly or are confused. Shortness of breath or difficulty breathing. Dizziness and fainting. You get a rash or develop hives. You have a decrease in urine output. Your urine turns a dark color or changes to pink, red, or brown. Any of the following  symptoms occur over the next 10 days: You have a temperature by mouth above 102 F (38.9 C), not controlled by medicine. Shortness of breath. Weakness after normal activity. The white part of the eye turns yellow (jaundice).  You have a decrease in the amount of urine or are urinating less often. Your urine turns a dark color or changes to pink, red, or brown. Document Released: 01/19/2000 Document Revised: 04/15/2011 Document Reviewed: 09/07/2007 [facility] Patient Information 2014 [place], [place].  _______________________________________________________________________        If you would like to see a video about joint replacement:   [email or web address]

## 2023-08-27 ENCOUNTER — Encounter (HOSPITAL_COMMUNITY): Payer: Self-pay

## 2023-08-27 ENCOUNTER — Encounter (HOSPITAL_COMMUNITY)
Admission: RE | Admit: 2023-08-27 | Discharge: 2023-08-27 | Disposition: A | Discharge status: Home / Self Care | Source: Ambulatory Visit | Attending: Orthopedic Surgery | Admitting: Orthopedic Surgery

## 2023-08-27 ENCOUNTER — Other Ambulatory Visit (HOSPITAL_BASED_OUTPATIENT_CLINIC_OR_DEPARTMENT_OTHER): Payer: Self-pay

## 2023-08-27 ENCOUNTER — Other Ambulatory Visit: Payer: Self-pay

## 2023-08-27 VITALS — BP 122/62 | HR 78 | Temp 98.2°F | Resp 16 | Ht 61.0 in | Wt 118.0 lb

## 2023-08-27 DIAGNOSIS — M1611 Unilateral primary osteoarthritis, right hip: Secondary | ICD-10-CM | POA: Diagnosis not present

## 2023-08-27 DIAGNOSIS — Z01818 Encounter for other preprocedural examination: Secondary | ICD-10-CM

## 2023-08-27 DIAGNOSIS — I10 Essential (primary) hypertension: Secondary | ICD-10-CM | POA: Insufficient documentation

## 2023-08-27 DIAGNOSIS — Z01812 Encounter for preprocedural laboratory examination: Secondary | ICD-10-CM | POA: Insufficient documentation

## 2023-08-27 HISTORY — DX: Anxiety disorder, unspecified: F41.9

## 2023-08-27 HISTORY — DX: Myoneural disorder, unspecified: G70.9

## 2023-08-27 HISTORY — DX: Essential (primary) hypertension: I10

## 2023-08-27 LAB — CBC
HCT: 39.6 % (ref 36.0–46.0)
Hemoglobin: 13.1 g/dL (ref 12.0–15.0)
MCH: 32.4 pg (ref 26.0–34.0)
MCHC: 33.1 g/dL (ref 30.0–36.0)
MCV: 98 fL (ref 80.0–100.0)
Platelets: 361 K/uL (ref 150–400)
RBC: 4.04 MIL/uL (ref 3.87–5.11)
RDW: 13.2 % (ref 11.5–15.5)
WBC: 9.4 K/uL (ref 4.0–10.5)
nRBC: 0 % (ref 0.0–0.2)

## 2023-08-27 LAB — BASIC METABOLIC PANEL WITH GFR
Anion gap: 6 (ref 5–15)
BUN: 19 mg/dL (ref 8–23)
CO2: 27 mmol/L (ref 22–32)
Calcium: 9.8 mg/dL (ref 8.9–10.3)
Chloride: 108 mmol/L (ref 98–111)
Creatinine, Ser: 0.7 mg/dL (ref 0.44–1.00)
GFR, Estimated: >60 mL/min (ref >60)
Glucose, Bld: 139 mg/dL — ABNORMAL HIGH (ref 70–99)
Potassium: 5.4 mmol/L — ABNORMAL HIGH (ref 3.5–5.1)
Sodium: 141 mmol/L (ref 135–145)

## 2023-08-27 LAB — TYPE AND SCREEN
ABO/RH(D): O POS
Antibody Screen: NEGATIVE

## 2023-08-27 LAB — SURGICAL PCR SCREEN
MRSA, PCR: NEGATIVE
Staphylococcus aureus: NEGATIVE

## 2023-08-27 NOTE — Telephone Encounter (Signed)
 Noted

## 2023-08-27 NOTE — Telephone Encounter (Signed)
 Please see note from 07/24/23. PPW placed on your desk.

## 2023-08-28 ENCOUNTER — Encounter (HOSPITAL_COMMUNITY): Payer: Self-pay

## 2023-08-28 NOTE — Progress Notes (Signed)
 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 Followed by Pulmonology in the past. Uses inhalers.  Patient follows with Neurology for cervical dystonia. She gets periodic botox injections. Last injection was in March 2025.   Patient follows with [facility] for NPH s/p VP shunt placement on 07/02/23. Postoperatively she had neck stiffness and pain. Muscle relaxer and steroid dose pack was ordered. Cervical XR was done without any evidence of abnormality. Last seen in clinic on 08/18/23 and incisions noted to be healing well and neck pain improved. Clearance from [facility] scanned in media on 7/24 with following instructions: Patient is cleared for surgery from a neurosurgical standpoint. She is not prescribed any blood thinners. If an MRI is needed, it will have to be scheduled with a clinic visit to hav eher shunt reprogrammed within 24 hours.  Seen by Cardiology at [facility] on 04/29/23 for pre op exam for her shunt placement. Was cleared for surgery and surgery was uncomplicated:   Preoperative Cardiac Risk Assessment -- Patient is doing well without any complaints of angina or exertional shortness of breath. Her symptoms are non-cardiac in nature and more consistent with her GI issues, such as Achalasia and also MSK issues. -- No prior TTE or stress testing. -- Patient denies for angina or heart failure symptoms, currently warm and well perfused, euvolemic. -- Revised cardiac risk index score: 0,  correlating with a 3.9% risk of MACE at 30 days. Overall low risk based on score. Plan: -- No further preop cardiac evaluation is warranted.  Medical clearance scanned in media from PCP on 7/24 that patient is cleared from medical/cardiac perspective and low risk  VS: BP 122/62 Comment: right arm sitting  Pulse 78   Temp 36.8 C (Oral)   Resp 16   Ht 5' 1 (1.549 m)   Wt 53.5 kg   SpO2 98%   BMI 22.30 kg/m   PROVIDERS: Merna Huxley, NP   LABS: Labs reviewed: Acceptable for surgery. Mild hyperkalemia (all labs ordered are listed, but only abnormal results are displayed)  Labs Reviewed  BASIC METABOLIC PANEL WITH GFR - Abnormal; Notable for the following components:      Result Value   Potassium 5.4 (*)    Glucose, Bld 139 (*)    All other components within normal limits  SURGICAL PCR SCREEN  CBC  TYPE AND SCREEN     IMAGES: C-spine xray 07/16/23 ([facility]):  Impression  No acute displaced fracture or prevertebral soft tissue swelling. Reversal of cervical lordosis with minimal anterolisthesis of C2 on C3 and C3 on C4. 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 No dynamic instability.  EKG 04/29/23 ([facility]; report only):  Sinus rhythm with sinus arrhythmia Rightward axis Otherwise normal No previous ECGs available  CV:  Past Medical History:  Diagnosis Date   Achalasia    ADHD    Allergy    Anxiety    Cervical dystonia    Chicken pox    Esophageal obstruction    per  patient she is unable to eat solid food   Hearing loss    Hypertension    Meniere disease    Neuromuscular disorder (HCC)    toricollis  gets Botox injections q 3-34 months   Osteoarthritis    Spinal stenosis     Past Surgical History:  Procedure Laterality Date   BALLOON DILATION N/A 01/30/2021   Procedure: BALLOON DILATION;  Surgeon: Shila Gustav GAILS, MD;  [facility];  Service: Endoscopy;  Laterality: N/A;   BRAIN SURGERY  07/02/2023   Right  frontal VP shunt   CESAREAN SECTION  1971, 1979   CYST EXCISION     Pilonidal cyst removed   ESOPHAGEAL BRUSHING  01/30/2021   Procedure: ESOPHAGEAL BRUSHING;  Surgeon: Shila Gustav GAILS, MD;  [facility];  Service: Endoscopy;;   ESOPHAGOGASTRODUODENOSCOPY (EGD) WITH PROPOFOL  N/A 01/30/2021   Procedure: ESOPHAGOGASTRODUODENOSCOPY (EGD) WITH PROPOFOL ;  Surgeon: Shila Gustav GAILS, MD;  [facility];  Service: Endoscopy;  Laterality: N/A;   EYE SURGERY Bilateral    cataract removal   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   NISSEN FUNDOPLICATION  2007   TOTAL HIP ARTHROPLASTY Left 06/2012    MEDICATIONS:  Acetaminophen  (PAIN RELIEF) 167 MG/5ML LIQD   albuterol  (VENTOLIN  HFA) 108 (90 Base) MCG/ACT inhaler   amphetamine -dextroamphetamine  (ADDERALL) 15 MG tablet   amphetamine -dextroamphetamine  (ADDERALL) 15 MG tablet   amphetamine -dextroamphetamine  (ADDERALL) 15 MG tablet   clonazePAM  (KLONOPIN ) 0.5 MG tablet   diclofenac  (VOLTAREN ) 75 MG EC tablet   fluticasone  (FLONASE ) 50 MCG/ACT nasal spray   Fluticasone  Furoate (ARNUITY ELLIPTA ) 100 MCG/ACT AEPB   HYDROcodone-acetaminophen  (NORCO/VICODIN) 5-325 MG tablet   lisinopril  (ZESTRIL ) 2.5 MG tablet   methocarbamol  (ROBAXIN ) 500 MG tablet   Multiple Vitamin (MULTIVITAMIN WITH MINERALS) TABS tablet   simvastatin  (ZOCOR ) 10 MG tablet   No current facility-administered medications for this encounter.   Burnard CHRISTELLA Odis DEVONNA [facility] Short Stay/Anesthesiology [facility] Phone [phone] 08/28/2023 10:36 AM

## 2023-09-01 ENCOUNTER — Other Ambulatory Visit (HOSPITAL_BASED_OUTPATIENT_CLINIC_OR_DEPARTMENT_OTHER): Payer: Self-pay

## 2023-09-08 NOTE — Anesthesia Preprocedure Evaluation (Signed)
 Anesthesia Evaluation  Patient identified by MRN, date of birth, ID band Patient awake    Reviewed: Allergy & Precautions, H&P , NPO status , Patient's Chart, lab work & pertinent test results, reviewed documented beta blocker date and time   Airway Mallampati: II  TM Distance: >3 FB Neck ROM: full    Dental no notable dental hx. (+) Teeth Intact, Dental Advisory Given   Pulmonary neg pulmonary ROS, former smoker   Pulmonary exam normal breath sounds clear to auscultation       Cardiovascular Exercise Tolerance: Good hypertension, Pt. on medications negative cardio ROS  Rhythm:regular Rate:Normal     Neuro/Psych  PSYCHIATRIC DISORDERS      negative neurological ROS     GI/Hepatic negative GI ROS, Neg liver ROS,,,  Endo/Other  negative endocrine ROS    Renal/GU negative Renal ROS  negative genitourinary   Musculoskeletal  (+) Arthritis ,    Abdominal   Peds  Hematology negative hematology ROS (+)   Anesthesia Other Findings   Reproductive/Obstetrics negative OB ROS                              Anesthesia Physical Anesthesia Plan  ASA: 3  Anesthesia Plan: MAC, Regional and Spinal   Post-op Pain Management: Minimal or no pain anticipated   Induction: Intravenous  PONV Risk Score and Plan: 2  Airway Management Planned: Natural Airway, Simple Face Mask and Nasal Cannula  Additional Equipment: None  Intra-op Plan:   Post-operative Plan: Extubation in OR  Informed Consent: I have reviewed the patients History and Physical, chart, labs and discussed the procedure including the risks, benefits and alternatives for the proposed anesthesia with the patient or authorized representative who has indicated his/her understanding and acceptance.     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 Followed by Pulmonology in the past. Uses inhalers.   Patient follows with Neurology for cervical dystonia. She gets periodic botox injections. Last injection was in March 2025.    Patient follows with [facility] for NPH s/p VP shunt placement on 07/02/23. Postoperatively she had neck stiffness and pain. Muscle relaxer and steroid dose pack was ordered. Cervical XR was done without any evidence of abnormality. Last seen in clinic on 08/18/23 and incisions noted to be healing well and neck pain improved. Clearance from [facility] scanned in media on 7/24 with following instructions: Patient is cleared for surgery from a neurosurgical standpoint. She is not prescribed any blood thinners. If an MRI is needed, it will have to be scheduled with a clinic visit to hav eher shunt reprogrammed within 24 hours.   Seen by Cardiology at [facility] on 04/29/23 for pre op exam for her shunt placement. Was cleared for surgery and surgery was uncomplicated:    Preoperative Cardiac Risk Assessment -- Patient is doing well without any complaints of angina or exertional shortness of breath. Her symptoms are non-cardiac in nature and more consistent with her GI issues, such as Achalasia and also MSK issues. -- No prior TTE or stress testing. -- Patient denies for angina or heart failure symptoms, currently  warm and well perfused, euvolemic. -- Revised cardiac risk index score: 0, correlating with a 3.9% risk of MACE at 30 days. Overall low risk based on score. Plan: -- No further preop cardiac evaluation is warranted.   Medical clearance scanned in media from PCP on 7/24 that patient is cleared from medical/cardiac perspective and low risk    )         Anesthesia Quick Evaluation

## 2023-09-08 NOTE — H&P (Signed)
 TOTAL HIP ADMISSION H&P  Patient is admitted for right total hip arthroplasty.    Risks and benefits of the surgery were discussed with the patient and Dr. Ernie at their previous office visit, and the patient has elected to move forward with the aforementioned surgery. Post-operative care plans were discussed with the patient today and all patient questions were answered. Therapy Plans: HEP Disposition: Home with daughter x1 week, then neighbor linda to help Planned DVT Prophylaxis: aspirin  81mg  BID DME needed: none PCP: Darleene Shape - awaiting on form Neuro: Dr. Vona - clearance received TXA: IV Allergies: NKDA Anesthesia Concerns: none BMI: Last HgbA1c: Not diabetic   Other: - oxycodone , robaxin , tylenol , celebrex - HAS TO HAVE SHUNT REPROGRAMED IF SHE HAS AN MRI - No hx of VTE or cancer - normal pressure hydrocephalus - [facility]  Subjective:  Chief Complaint: right hip pain  HPI: Melissa Cervantes, 83 y.o. female, has a history of pain and functional disability in the right hip(s) due to arthritis and patient has failed non-surgical conservative treatments for greater than 12 weeks to include NSAID's and/or analgesics and activity modification.  Onset of symptoms was gradual starting 2 years ago with rapidlly worsening course since that time.The patient noted no past surgery on the right hip(s).  Patient currently rates pain in the right hip at 8 out of 10 with activity. Patient has worsening of pain with activity and weight bearing, trendelenberg gait, and pain that interfers with activities of daily living. Patient has evidence of joint space narrowing by imaging studies. This condition presents safety issues increasing the risk of falls.   There is no current active infection.  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   Degenerative arthritis of right knee 04/21/2020   Past Medical History:  Diagnosis Date   Achalasia    ADHD    Allergy    Anxiety    Cervical dystonia    Chicken pox    Esophageal obstruction    per patient she is unable to eat solid food   Hearing loss    Hypertension    Meniere disease    Neuromuscular disorder (HCC)    toricollis  gets Botox injections q 3-34 months   Osteoarthritis    Spinal stenosis     Past Surgical History:  Procedure Laterality Date   BALLOON DILATION N/A 01/30/2021   Procedure: BALLOON DILATION;  Surgeon: Shila Gustav GAILS, MD;  Location: [facility];  Service: Endoscopy;  Laterality: N/A;   BRAIN SURGERY  07/02/2023   Right frontal VP shunt   CESAREAN SECTION  1971, 1979   CYST EXCISION     Pilonidal cyst removed   ESOPHAGEAL BRUSHING  01/30/2021   Procedure: ESOPHAGEAL BRUSHING;  Surgeon: Shila Gustav GAILS, MD;  Location: [facility];  Service: Endoscopy;;   ESOPHAGOGASTRODUODENOSCOPY (EGD) WITH PROPOFOL  N/A 01/30/2021   Procedure: ESOPHAGOGASTRODUODENOSCOPY (EGD) WITH PROPOFOL ;  Surgeon: Shila Gustav GAILS, MD;  Location: [facility];  Service: Endoscopy;  Laterality: N/A;   EYE SURGERY Bilateral    cataract removal   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   NISSEN FUNDOPLICATION  2007   TOTAL HIP ARTHROPLASTY Left 06/2012  No current facility-administered medications for this encounter.   Current Outpatient Medications  Medication Sig Dispense Refill Last Dose/Taking   Acetaminophen  (PAIN RELIEF) 167 MG/5ML LIQD Take 500 mg by mouth in the morning, at noon, and at bedtime.   Taking   albuterol  (VENTOLIN  HFA) 108 (90 Base) MCG/ACT inhaler INHALE 1-2 PUFFS BY MOUTH EVERY 6 HOURS AS NEEDED FOR WHEEZE OR SHORTNESS OF BREATH 8.5 each 3 Taking   clonazePAM  (KLONOPIN ) 0.5 MG tablet Take 1 tablet (0.5 mg  total) by mouth 2 (two) times a day as needed (dystonic spasms). (Patient taking differently: Take 0.5 mg by mouth at bedtime.) 60 tablet 3 Taking Differently   diclofenac  (VOLTAREN ) 75 MG EC tablet Take 1 tablet (75 mg total) by mouth 2 (two) times daily with a meal. 60 tablet 0 Taking   fluticasone  (FLONASE ) 50 MCG/ACT nasal spray Place 1 spray into both nostrils daily as needed for allergies.   Taking As Needed   Fluticasone  Furoate (ARNUITY ELLIPTA ) 100 MCG/ACT AEPB INHALE 1 PUFF BY MOUTH EVERY DAY (Patient taking differently: Inhale 1 puff into the lungs daily as needed (respiratory issues.).) 30 each 0 Taking Differently   HYDROcodone-acetaminophen  (NORCO/VICODIN) 5-325 MG tablet Take 1 tablet by mouth every 6 (six) hours as needed (severe pain.).   Taking As Needed   lisinopril  (ZESTRIL ) 2.5 MG tablet Take 1 tablet (2.5 mg total) by mouth daily. (Patient taking differently: Take 2.5 mg by mouth every evening.) 90 tablet 0 Taking Differently   methocarbamol  (ROBAXIN ) 500 MG tablet Take 500 mg by mouth 4 (four) times daily as needed for muscle spasms.   Taking As Needed   Multiple Vitamin (MULTIVITAMIN WITH MINERALS) TABS tablet Take 1 tablet by mouth in the morning. Vita Fusion Multivitamin   Taking   simvastatin  (ZOCOR ) 10 MG tablet Take 1 tablet (10 mg total) by mouth at bedtime. 90 tablet 0 Taking   amphetamine -dextroamphetamine  (ADDERALL) 15 MG tablet Take 1 tablet by mouth 2 (two) times daily. 60 tablet 0    amphetamine -dextroamphetamine  (ADDERALL) 15 MG tablet Take 1 tablet by mouth 2 (two) times daily. 60 tablet 0    amphetamine -dextroamphetamine  (ADDERALL) 15 MG tablet Take 1 tablet by mouth 2 (two) times daily. 60 tablet 0    Allergies  Allergen Reactions   Lipitor [Atorvastatin ] Other (See Comments)    Fatigue and muscle aches    Latex Dermatitis   Other     Seasonal allergies, horses, cats and mold    Social History   Tobacco Use   Smoking status: Former   Smokeless tobacco:  Never  Substance Use Topics   Alcohol use: Yes    Comment: 4    Family History  Problem Relation Age of Onset   Heart disease Mother    Heart disease Father    Alcohol abuse Father    Dystonia Sister    Dystonia Brother    Colon cancer Neg Hx    Esophageal cancer Neg Hx    Stomach cancer Neg Hx      Review of Systems  Constitutional:  Negative for chills and fever.  Respiratory:  Negative for cough and shortness of breath.   Cardiovascular:  Negative for chest pain.  Gastrointestinal:  Negative for nausea and vomiting.  Musculoskeletal:  Positive for arthralgias.     Objective:  Physical Exam Well nourished and well developed. General: Alert and oriented x3, cooperative and pleasant, no acute distress.  Musculoskeletal: Very pleasant healthy 83 year old female awake alert and oriented. She  is in no acute distress. She walks in the office with a mild limp favoring her right hip.  Right hip exam: Passive range of motion is limited and painful with hip flexion internal rotation between 5 to 10 degrees with pelvic tilting due to pain, external rotation of 20 degrees with reproducible pain No significant external rotation contracture with active hip flexion with strength 5 -/5 due to pain Left hip exam reveals a fluid range of motion without reproducible pain No lower extremity edema, erythema or calf tenderness  Vital signs in last 24 hours:    Labs:   Estimated body mass index is 22.3 kg/m as calculated from the following:   Height as of 08/27/23: 5' 1 (1.549 m).   Weight as of 08/27/23: 53.5 kg.   Imaging Review Plain radiographs demonstrate severe degenerative joint disease of the right hip(s). The bone quality appears to be adequate for age and reported activity level.      Assessment/Plan:  End stage arthritis, right hip(s)  The patient history, physical examination, clinical judgement of the provider and imaging studies are consistent with end stage  degenerative joint disease of the right hip(s) and total hip arthroplasty is deemed medically necessary. The treatment options including medical management, injection therapy, arthroscopy and arthroplasty were discussed at length. The risks and benefits of total hip arthroplasty were presented and reviewed. The risks due to aseptic loosening, infection, stiffness, dislocation/subluxation,  thromboembolic complications and other imponderables were discussed.  The patient acknowledged the explanation, agreed to proceed with the plan and consent was signed. Patient is being admitted for inpatient treatment for surgery, pain control, PT, OT, prophylactic antibiotics, VTE prophylaxis, progressive ambulation and ADL's and discharge planning.The patient is planning to be discharged home.   Rosina Calin, PA-C Orthopedic Surgery [facility] [place] [phone]

## 2023-09-09 ENCOUNTER — Observation Stay (HOSPITAL_COMMUNITY)

## 2023-09-09 ENCOUNTER — Ambulatory Visit (HOSPITAL_COMMUNITY)

## 2023-09-09 ENCOUNTER — Ambulatory Visit (HOSPITAL_COMMUNITY): Payer: Self-pay | Admitting: Medical

## 2023-09-09 ENCOUNTER — Other Ambulatory Visit: Payer: Self-pay

## 2023-09-09 ENCOUNTER — Ambulatory Visit (HOSPITAL_BASED_OUTPATIENT_CLINIC_OR_DEPARTMENT_OTHER): Payer: Self-pay | Admitting: Anesthesiology

## 2023-09-09 ENCOUNTER — Encounter (HOSPITAL_COMMUNITY): Payer: Self-pay | Admitting: Orthopedic Surgery

## 2023-09-09 ENCOUNTER — Encounter (HOSPITAL_COMMUNITY)
Admission: RE | Disposition: A | Discharge status: Home / Self Care | Payer: Self-pay | Source: Home / Self Care | Attending: Orthopedic Surgery

## 2023-09-09 ENCOUNTER — Observation Stay (HOSPITAL_COMMUNITY)
Admission: RE | Admit: 2023-09-09 | Discharge: 2023-09-10 | Disposition: A | Discharge status: Home / Self Care | Attending: Orthopedic Surgery | Admitting: Orthopedic Surgery

## 2023-09-09 DIAGNOSIS — Z7982 Long term (current) use of aspirin: Secondary | ICD-10-CM | POA: Diagnosis not present

## 2023-09-09 DIAGNOSIS — I10 Essential (primary) hypertension: Secondary | ICD-10-CM | POA: Diagnosis not present

## 2023-09-09 DIAGNOSIS — Z96642 Presence of left artificial hip joint: Secondary | ICD-10-CM | POA: Insufficient documentation

## 2023-09-09 DIAGNOSIS — Z87891 Personal history of nicotine dependence: Secondary | ICD-10-CM | POA: Diagnosis not present

## 2023-09-09 DIAGNOSIS — Z96641 Presence of right artificial hip joint: Secondary | ICD-10-CM | POA: Diagnosis not present

## 2023-09-09 DIAGNOSIS — Z79899 Other long term (current) drug therapy: Secondary | ICD-10-CM | POA: Diagnosis not present

## 2023-09-09 DIAGNOSIS — M1611 Unilateral primary osteoarthritis, right hip: Secondary | ICD-10-CM

## 2023-09-09 DIAGNOSIS — M1711 Unilateral primary osteoarthritis, right knee: Secondary | ICD-10-CM | POA: Diagnosis not present

## 2023-09-09 DIAGNOSIS — M25551 Pain in right hip: Secondary | ICD-10-CM | POA: Diagnosis present

## 2023-09-09 DIAGNOSIS — Z01818 Encounter for other preprocedural examination: Secondary | ICD-10-CM

## 2023-09-09 HISTORY — PX: TOTAL HIP ARTHROPLASTY: SHX124

## 2023-09-09 LAB — ABO/RH: ABO/RH(D): O POS

## 2023-09-09 SURGERY — ARTHROPLASTY, HIP, TOTAL, ANTERIOR APPROACH
Anesthesia: Monitor Anesthesia Care | Site: Hip | Laterality: Right

## 2023-09-09 MED ORDER — FLUTICASONE PROPIONATE 50 MCG/ACT NA SUSP: 1.0000 | Freq: Every day | NASAL | Status: DC | PRN

## 2023-09-09 MED ORDER — METHOCARBAMOL 1000 MG/10ML IJ SOLN: 500.0000 mg | Freq: Four times a day (QID) | INTRAMUSCULAR | Status: DC | PRN

## 2023-09-09 MED ORDER — ALBUMIN HUMAN 5 % IV SOLN
INTRAVENOUS | Status: AC
  Filled 2023-09-09: qty 250

## 2023-09-09 MED ORDER — PHENYLEPHRINE HCL-NACL 20-0.9 MG/250ML-% IV SOLN
INTRAVENOUS | Status: DC | PRN
Start: 2023-09-09 — End: 2023-09-09
  Administered 2023-09-09: 60 ug/min via INTRAVENOUS

## 2023-09-09 MED ORDER — POVIDONE-IODINE 10 % EX SWAB: 2.0000 | Freq: Once | CUTANEOUS | Status: DC

## 2023-09-09 MED ORDER — HYDROMORPHONE HCL 1 MG/ML IJ SOLN: 0.5000 mg | INTRAMUSCULAR | Status: DC | PRN

## 2023-09-09 MED ORDER — METOCLOPRAMIDE HCL 5 MG PO TABS: 5.0000 mg | ORAL_TABLET | Freq: Three times a day (TID) | ORAL | Status: DC | PRN

## 2023-09-09 MED ORDER — KETOROLAC TROMETHAMINE 30 MG/ML IJ SOLN
INTRAMUSCULAR | Status: AC
  Filled 2023-09-09: qty 1

## 2023-09-09 MED ORDER — DIPHENHYDRAMINE HCL 12.5 MG/5ML PO ELIX: 12.5000 mg | ORAL_SOLUTION | ORAL | Status: DC | PRN

## 2023-09-09 MED ORDER — CEFAZOLIN SODIUM-DEXTROSE 2-4 GM/100ML-% IV SOLN
2.0000 g | INTRAVENOUS | Status: AC
  Administered 2023-09-09: 2 g via INTRAVENOUS
  Filled 2023-09-09: qty 100

## 2023-09-09 MED ORDER — ALUM & MAG HYDROXIDE-SIMETH 200-200-20 MG/5ML PO SUSP: 30.0000 mL | ORAL | Status: DC | PRN

## 2023-09-09 MED ORDER — POLYETHYLENE GLYCOL 3350 17 G PO PACK
17.0000 g | PACK | Freq: Two times a day (BID) | ORAL | Status: DC
  Administered 2023-09-09 – 2023-09-10 (×2): 17 g via ORAL
  Filled 2023-09-09 (×2): qty 1

## 2023-09-09 MED ORDER — PROPOFOL 500 MG/50ML IV EMUL
INTRAVENOUS | Status: DC | PRN
  Administered 2023-09-09: 50 ug/kg/min via INTRAVENOUS

## 2023-09-09 MED ORDER — LISINOPRIL 2.5 MG PO TABS
2.5000 mg | ORAL_TABLET | Freq: Every evening | ORAL | Status: DC
Start: 2023-09-10 — End: 2023-09-10
  Filled 2023-09-09: qty 1

## 2023-09-09 MED ORDER — FENTANYL CITRATE PF 50 MCG/ML IJ SOSY
50.0000 ug | PREFILLED_SYRINGE | Freq: Once | INTRAMUSCULAR | Status: AC
  Administered 2023-09-09: 50 ug via INTRAVENOUS
  Filled 2023-09-09: qty 1

## 2023-09-09 MED ORDER — ONDANSETRON HCL 4 MG/2ML IJ SOLN: 4.0000 mg | Freq: Once | INTRAMUSCULAR | Status: DC | PRN

## 2023-09-09 MED ORDER — DEXAMETHASONE SODIUM PHOSPHATE 10 MG/ML IJ SOLN
8.0000 mg | Freq: Once | INTRAMUSCULAR | Status: AC
  Administered 2023-09-09: 10 mg via INTRAVENOUS

## 2023-09-09 MED ORDER — PHENYLEPHRINE 80 MCG/ML (10ML) SYRINGE FOR IV PUSH (FOR BLOOD PRESSURE SUPPORT)
PREFILLED_SYRINGE | INTRAVENOUS | Status: AC
  Filled 2023-09-09: qty 20

## 2023-09-09 MED ORDER — ACETAMINOPHEN 500 MG PO TABS
1000.0000 mg | ORAL_TABLET | Freq: Once | ORAL | Status: DC
  Filled 2023-09-09: qty 2

## 2023-09-09 MED ORDER — 0.9 % SODIUM CHLORIDE (POUR BTL) OPTIME
TOPICAL | Status: DC | PRN
  Administered 2023-09-09: 1000 mL

## 2023-09-09 MED ORDER — SODIUM CHLORIDE 0.9 % IV SOLN: INTRAVENOUS | Status: DC

## 2023-09-09 MED ORDER — SODIUM CHLORIDE (PF) 0.9 % IJ SOLN
INTRAMUSCULAR | Status: AC
  Filled 2023-09-09: qty 30

## 2023-09-09 MED ORDER — MEPERIDINE HCL 25 MG/ML IJ SOLN: 6.2500 mg | INTRAMUSCULAR | Status: DC | PRN

## 2023-09-09 MED ORDER — SENNA 8.6 MG PO TABS
2.0000 | ORAL_TABLET | Freq: Every day | ORAL | Status: DC
  Administered 2023-09-09: 17.2 mg via ORAL
  Filled 2023-09-09: qty 2

## 2023-09-09 MED ORDER — CHLORHEXIDINE GLUCONATE 0.12 % MT SOLN
15.0000 mL | Freq: Once | OROMUCOSAL | Status: AC
  Administered 2023-09-09: 15 mL via OROMUCOSAL

## 2023-09-09 MED ORDER — PHENOL 1.4 % MT LIQD: 1.0000 | OROMUCOSAL | Status: DC | PRN

## 2023-09-09 MED ORDER — OXYCODONE HCL 5 MG PO TABS
5.0000 mg | ORAL_TABLET | ORAL | Status: DC | PRN
  Administered 2023-09-09 – 2023-09-10 (×2): 5 mg via ORAL
  Filled 2023-09-09 (×2): qty 1

## 2023-09-09 MED ORDER — DEXAMETHASONE SODIUM PHOSPHATE 10 MG/ML IJ SOLN
10.0000 mg | Freq: Once | INTRAMUSCULAR | Status: AC
  Administered 2023-09-10: 10 mg via INTRAVENOUS
  Filled 2023-09-09: qty 1

## 2023-09-09 MED ORDER — CLONAZEPAM 0.5 MG PO TABS
0.5000 mg | ORAL_TABLET | Freq: Every day | ORAL | Status: DC
  Administered 2023-09-09: 0.5 mg via ORAL
  Filled 2023-09-09: qty 1

## 2023-09-09 MED ORDER — BUPIVACAINE-EPINEPHRINE (PF) 0.25% -1:200000 IJ SOLN
INTRAMUSCULAR | Status: AC
  Filled 2023-09-09: qty 30

## 2023-09-09 MED ORDER — KETOROLAC TROMETHAMINE 15 MG/ML IJ SOLN
7.5000 mg | Freq: Four times a day (QID) | INTRAMUSCULAR | Status: AC
  Administered 2023-09-09 (×2): 7.5 mg via INTRAVENOUS
  Filled 2023-09-09 (×2): qty 1

## 2023-09-09 MED ORDER — ONDANSETRON HCL 4 MG/2ML IJ SOLN
INTRAMUSCULAR | Status: AC
  Filled 2023-09-09: qty 2

## 2023-09-09 MED ORDER — LACTATED RINGERS IV SOLN: INTRAVENOUS | Status: DC

## 2023-09-09 MED ORDER — PHENYLEPHRINE 80 MCG/ML (10ML) SYRINGE FOR IV PUSH (FOR BLOOD PRESSURE SUPPORT)
PREFILLED_SYRINGE | INTRAVENOUS | Status: DC | PRN
  Administered 2023-09-09: 40 ug via INTRAVENOUS
  Administered 2023-09-09: 80 ug via INTRAVENOUS

## 2023-09-09 MED ORDER — METHOCARBAMOL 500 MG PO TABS: 500.0000 mg | ORAL_TABLET | Freq: Four times a day (QID) | ORAL | Status: DC | PRN

## 2023-09-09 MED ORDER — DEXAMETHASONE SODIUM PHOSPHATE 10 MG/ML IJ SOLN
INTRAMUSCULAR | Status: AC
  Filled 2023-09-09: qty 1

## 2023-09-09 MED ORDER — ONDANSETRON HCL 4 MG PO TABS: 4.0000 mg | ORAL_TABLET | Freq: Four times a day (QID) | ORAL | Status: DC | PRN

## 2023-09-09 MED ORDER — TRANEXAMIC ACID-NACL 1000-0.7 MG/100ML-% IV SOLN
1000.0000 mg | Freq: Once | INTRAVENOUS | Status: AC
  Administered 2023-09-09: 1000 mg via INTRAVENOUS
  Filled 2023-09-09: qty 100

## 2023-09-09 MED ORDER — TRANEXAMIC ACID-NACL 1000-0.7 MG/100ML-% IV SOLN
1000.0000 mg | INTRAVENOUS | Status: AC
  Administered 2023-09-09: 1000 mg via INTRAVENOUS
  Filled 2023-09-09: qty 100

## 2023-09-09 MED ORDER — ONDANSETRON HCL 4 MG/2ML IJ SOLN
INTRAMUSCULAR | Status: DC | PRN
  Administered 2023-09-09: 4 mg via INTRAVENOUS

## 2023-09-09 MED ORDER — OXYCODONE HCL 5 MG PO TABS: 5.0000 mg | ORAL_TABLET | ORAL | Status: DC | PRN

## 2023-09-09 MED ORDER — ALBUTEROL SULFATE (2.5 MG/3ML) 0.083% IN NEBU: 3.0000 mL | INHALATION_SOLUTION | Freq: Four times a day (QID) | RESPIRATORY_TRACT | Status: DC | PRN

## 2023-09-09 MED ORDER — FENTANYL CITRATE PF 50 MCG/ML IJ SOSY: 25.0000 ug | PREFILLED_SYRINGE | INTRAMUSCULAR | Status: DC | PRN

## 2023-09-09 MED ORDER — MENTHOL 3 MG MT LOZG: 1.0000 | LOZENGE | OROMUCOSAL | Status: DC | PRN

## 2023-09-09 MED ORDER — BISACODYL 10 MG RE SUPP: 10.0000 mg | Freq: Every day | RECTAL | Status: DC | PRN

## 2023-09-09 MED ORDER — FENTANYL CITRATE (PF) 100 MCG/2ML IJ SOLN
INTRAMUSCULAR | Status: AC
Start: 2023-09-09 — End: 2023-09-09
  Filled 2023-09-09: qty 2

## 2023-09-09 MED ORDER — SODIUM CHLORIDE (PF) 0.9 % IJ SOLN
INTRAMUSCULAR | Status: DC | PRN
  Administered 2023-09-09: 61 mL

## 2023-09-09 MED ORDER — OXYCODONE HCL 5 MG/5ML PO SOLN: 5.0000 mg | Freq: Once | ORAL | Status: DC | PRN

## 2023-09-09 MED ORDER — ONDANSETRON HCL 4 MG/2ML IJ SOLN: 4.0000 mg | Freq: Four times a day (QID) | INTRAMUSCULAR | Status: DC | PRN

## 2023-09-09 MED ORDER — CEFAZOLIN SODIUM-DEXTROSE 2-4 GM/100ML-% IV SOLN
2.0000 g | Freq: Four times a day (QID) | INTRAVENOUS | Status: AC
  Administered 2023-09-09 (×2): 2 g via INTRAVENOUS
  Filled 2023-09-09 (×2): qty 100

## 2023-09-09 MED ORDER — ACETAMINOPHEN 500 MG PO TABS
1000.0000 mg | ORAL_TABLET | Freq: Four times a day (QID) | ORAL | Status: DC
  Administered 2023-09-09 – 2023-09-10 (×4): 1000 mg via ORAL
  Filled 2023-09-09 (×4): qty 2

## 2023-09-09 MED ORDER — MIDAZOLAM HCL 2 MG/2ML IJ SOLN
INTRAMUSCULAR | Status: AC
  Filled 2023-09-09: qty 2

## 2023-09-09 MED ORDER — ASPIRIN 81 MG PO CHEW
81.0000 mg | CHEWABLE_TABLET | Freq: Two times a day (BID) | ORAL | Status: DC
  Administered 2023-09-09 – 2023-09-10 (×2): 81 mg via ORAL
  Filled 2023-09-09 (×2): qty 1

## 2023-09-09 MED ORDER — METOCLOPRAMIDE HCL 5 MG/ML IJ SOLN: 5.0000 mg | Freq: Three times a day (TID) | INTRAMUSCULAR | Status: DC | PRN

## 2023-09-09 MED ORDER — STERILE WATER FOR IRRIGATION IR SOLN
Status: DC | PRN
  Administered 2023-09-09: 2000 mL

## 2023-09-09 MED ORDER — SIMVASTATIN 10 MG PO TABS
10.0000 mg | ORAL_TABLET | Freq: Every day | ORAL | Status: DC
  Administered 2023-09-09: 10 mg via ORAL
  Filled 2023-09-09: qty 1

## 2023-09-09 MED ORDER — OXYCODONE HCL 5 MG PO TABS: 5.0000 mg | ORAL_TABLET | Freq: Once | ORAL | Status: DC | PRN

## 2023-09-09 MED ORDER — ORAL CARE MOUTH RINSE: 15.0000 mL | Freq: Once | OROMUCOSAL | Status: AC

## 2023-09-09 MED ORDER — ALBUMIN HUMAN 5 % IV SOLN
12.5000 g | Freq: Once | INTRAVENOUS | Status: AC
  Administered 2023-09-09: 12.5 g via INTRAVENOUS

## 2023-09-09 MED ORDER — MIDAZOLAM HCL 5 MG/5ML IJ SOLN
INTRAMUSCULAR | Status: DC | PRN
  Administered 2023-09-09 (×2): 1 mg via INTRAVENOUS

## 2023-09-09 SURGICAL SUPPLY — 40 items
BAG COUNTER SPONGE SURGICOUNT (BAG) IMPLANT
BAG ZIPLOCK 12X15 (MISCELLANEOUS) ×1 IMPLANT
BLADE SAG 18X100X1.27 (BLADE) ×1 IMPLANT
COVER PERINEAL POST (MISCELLANEOUS) ×1 IMPLANT
COVER SURGICAL LIGHT HANDLE (MISCELLANEOUS) ×1 IMPLANT
CUP ACET PINNACLE SECTR 50MM (Hips) IMPLANT
DERMABOND ADVANCED .7 DNX12 (GAUZE/BANDAGES/DRESSINGS) ×1 IMPLANT
DRAPE FOOT SWITCH (DRAPES) ×1 IMPLANT
DRAPE STERI IOBAN 125X83 (DRAPES) ×1 IMPLANT
DRAPE U-SHAPE 47X51 STRL (DRAPES) ×2 IMPLANT
DRESSING AQUACEL AG SP 3.5X10 (GAUZE/BANDAGES/DRESSINGS) ×1 IMPLANT
DRSG AQUACEL AG ADV 3.5X10 (GAUZE/BANDAGES/DRESSINGS) IMPLANT
DURAPREP 26ML APPLICATOR (WOUND CARE) ×1 IMPLANT
ELECT REM PT RETURN 15FT ADLT (MISCELLANEOUS) ×1 IMPLANT
GLOVE BIO SURGEON STRL SZ 6 (GLOVE) ×1 IMPLANT
GLOVE BIOGEL PI IND STRL 6.5 (GLOVE) ×1 IMPLANT
GLOVE BIOGEL PI IND STRL 7.5 (GLOVE) ×1 IMPLANT
GLOVE ORTHO TXT STRL SZ7.5 (GLOVE) ×2 IMPLANT
GLOVE PI ORTHO PRO STRL 7.5 (GLOVE) IMPLANT
GLOVE SURG SYN 6.5 PF PI BL (GLOVE) IMPLANT
GOWN STRL REUS W/ TWL LRG LVL3 (GOWN DISPOSABLE) ×2 IMPLANT
HEAD FEM STD 32X+5 STRL (Hips) IMPLANT
HOLDER FOLEY CATH W/STRAP (MISCELLANEOUS) ×1 IMPLANT
KIT TURNOVER KIT A (KITS) ×1 IMPLANT
LINER ACET PNNCL PLUS4 NEUTRAL (Hips) IMPLANT
MANIFOLD NEPTUNE II (INSTRUMENTS) ×1 IMPLANT
NDL SAFETY ECLIPSE 18X1.5 (NEEDLE) ×1 IMPLANT
PACK ANTERIOR HIP CUSTOM (KITS) ×1 IMPLANT
PENCIL SMOKE EVACUATOR (MISCELLANEOUS) ×1 IMPLANT
SCREW 6.5MMX35MM (Screw) IMPLANT
STEM FEMORAL SZ5 HIGH ACTIS (Stem) IMPLANT
SUT MNCRL AB 4-0 PS2 18 (SUTURE) ×1 IMPLANT
SUT VIC AB 1 CT1 36 (SUTURE) ×3 IMPLANT
SUT VIC AB 2-0 CT1 TAPERPNT 27 (SUTURE) ×2 IMPLANT
SUTURE STRATFX 0 PDS 27 VIOLET (SUTURE) ×1 IMPLANT
SYR 3ML LL SCALE MARK (SYRINGE) ×1 IMPLANT
TOWEL GREEN STERILE FF (TOWEL DISPOSABLE) ×1 IMPLANT
TRAY FOLEY MTR SLVR 16FR STAT (SET/KITS/TRAYS/PACK) ×1 IMPLANT
TUBE SUCTION HIGH CAP CLEAR NV (SUCTIONS) ×1 IMPLANT
WATER STERILE IRR 1000ML POUR (IV SOLUTION) ×1 IMPLANT

## 2023-09-09 NOTE — Discharge Instructions (Signed)

## 2023-09-09 NOTE — Op Note (Signed)
 NAME:  Melissa Cervantes                ACCOUNT NO.: [id number]      MEDICAL RECORD NO.: [id number]      FACILITY:  [facility]      PHYSICIAN:  Donnice JONETTA Car  DATE OF BIRTH:  Jun 25, 1940     DATE OF PROCEDURE:  09/09/2023                                 OPERATIVE REPORT         PREOPERATIVE DIAGNOSIS: Right  hip osteoarthritis.      POSTOPERATIVE DIAGNOSIS:  Right hip osteoarthritis.      PROCEDURE:  Right total hip replacement through an anterior approach   utilizing DePuy THR system, component size 50 mm pinnacle cup, a size 32+4 neutral   Altrex liner, a size 5 Hi Actis stem with a 32+5 Articuleze metal head ball      SURGEON:  Donnice JONETTA. Car, M.D.      ASSISTANT:  Dwayne Shuford, PA-C     ANESTHESIA:  Spinal.      SPECIMENS:  None.      COMPLICATIONS:  None.      BLOOD LOSS:  100 cc     DRAINS:  None.      INDICATION OF THE PROCEDURE:  Melissa Cervantes is a 83 y.o. female who had   presented to office for evaluation of right hip pain.  Radiographs revealed   progressive degenerative changes with bone-on-bone   articulation of the  hip joint, including subchondral cystic changes and osteophytes.  The patient had painful limited range of   motion significantly affecting their overall quality of life and function.  The patient was failing to    respond to conservative measures including medications and/or injections and activity modification and at this point was ready   to proceed with more definitive measures.  Consent was obtained for   benefit of pain relief.  Specific risks of infection, DVT, component   failure, dislocation, neurovascular injury, and need for revision surgery were reviewed in the office.     PROCEDURE IN DETAIL:  The patient was brought to operative theater.   Once adequate anesthesia, preoperative antibiotics, 2 gm of Ancef , 1 gm of Tranexamic Acid , and 10 mg of Decadron  were administered, the patient was positioned supine on the  [employer] table.  Once the patient was safely positioned with adequate padding of boney prominences we predraped out the hip, and used fluoroscopy to confirm orientation of the pelvis.      The right hip was then prepped and draped from proximal iliac crest to   mid thigh with a shower curtain technique.      Time-out was performed identifying the patient, planned procedure, and the appropriate extremity.     An incision was then made 2 cm lateral to the   anterior superior iliac spine extending over the orientation of the   tensor fascia lata muscle and sharp dissection was carried down to the   fascia of the muscle.      The fascia was then incised.  The muscle belly was identified and swept   laterally and retractor placed along the superior neck.  Following   cauterization of the circumflex vessels and removing some pericapsular   fat, a second cobra retractor was placed on the inferior neck.  A  T-capsulotomy was made along the line of the   superior neck to the trochanteric fossa, then extended proximally and   distally.  Tag sutures were placed and the retractors were then placed   intracapsular.  We then identified the trochanteric fossa and   orientation of my neck cut and then made a neck osteotomy with the femur on traction.  The femoral   head was removed without difficulty or complication.  Traction was let   off and retractors were placed posterior and anterior around the   acetabulum.      The labrum and foveal tissue were debrided.  I began reaming with a 45 mm   reamer and reamed up to 49 mm reamer with good bony bed preparation and a 50 mm  cup was chosen.  The final 50 mm Pinnacle cup was then impacted under fluoroscopy to confirm the depth of penetration and orientation with respect to   Abduction and forward flexion.  A screw was placed into the ilium followed by the hole eliminator.  The final   32+4 neutral Altrex liner was impacted with good visualized rim fit.  The  cup was positioned anatomically within the acetabular portion of the pelvis.      At this point, the femur was rolled to 100 degrees.  Further capsule was   released off the inferior aspect of the femoral neck.  I then   released the superior capsule proximally.  With the leg in a neutral position the hook was placed laterally   along the femur under the vastus lateralis origin and elevated manually and then held in position using the hook attachment on the bed.  The leg was then extended and adducted with the leg rolled to 100   degrees of external rotation.  Retractors were placed along the medial calcar and posteriorly over the greater trochanter.  Once the proximal femur was fully   exposed, I used a box osteotome to set orientation.  I then began   broaching with the starting chili pepper broach and passed this by hand and then broached up to 5.  With the 5 broach in place I chose a high offset neck and did several trial reductions.  The offset was appropriate, leg lengths   appeared to be equal best matched with the +5 head ball trial confirmed radiographically.   Given these findings, I went ahead and dislocated the hip, repositioned all   retractors and positioned the right hip in the extended and abducted position.  The final 5 Hi Actis stem was   chosen and it was impacted down to the level of neck cut.  Based on this   and the trial reductions, a final 32+5 Articuleze metal head ball was chosen and   impacted onto a clean and dry trunnion, and the hip was reduced.  The   hip had been irrigated throughout the case again at this point.  I did   reapproximate the superior capsular leaflet to the anterior leaflet   using #1 Vicryl.  The fascia of the   tensor fascia lata muscle was then reapproximated using #1 Vicryl and #0 Stratafix sutures.  The   remaining wound was closed with 2-0 Vicryl and running 4-0 Monocryl.   The hip was cleaned, dried, and dressed sterilely using Dermabond and    Aquacel dressing.  The patient was then brought   to recovery room in stable condition tolerating the procedure well.    Dwayne Ricks, PA-C  was present for the entirety of the case involved from   preoperative positioning, perioperative retractor management, general   facilitation of the case, as well as primary wound closure as assistant.            Donnice CORDOBA Ernie, M.D.        09/09/2023 9:39 AM

## 2023-09-09 NOTE — Transfer of Care (Signed)
 Immediate Anesthesia Transfer of Care Note  Patient: Melissa Cervantes  Procedure(s) Performed: ARTHROPLASTY, HIP, TOTAL, ANTERIOR APPROACH (Right: Hip)  Patient Location: PACU  Anesthesia Type:MAC and Spinal  Level of Consciousness: awake, alert , and oriented  Airway & Oxygen Therapy: Patient Spontanous Breathing and Patient connected to face mask oxygen  Post-op Assessment: Report given to RN and Post -op Vital signs reviewed and stable  Post vital signs: Reviewed and stable  Last Vitals:  Vitals Value Taken Time  BP 108/63 09/09/23 12:34  Temp 36.4 C 09/09/23 12:30  Pulse 61 09/09/23 12:37  Resp 13 09/09/23 12:37  SpO2 100 % 09/09/23 12:37  Vitals shown include unfiled device data.  Last Pain:  Vitals:   09/09/23 0943  TempSrc:   PainSc: Asleep         Complications: No notable events documented.

## 2023-09-09 NOTE — Care Plan (Signed)
 Ortho Bundle Case Management Note  Patient Details  Name: Melissa Cervantes MRN: [id number] Date of Birth: Dec 13, 1940                  R THA on 09/09/23.  DCP: Home with daughter x1 week, then neighbor Melissa Cervantes to help  DME: No needs. Has RW.  PT: HEP   DME Arranged:  N/A DME Agency:       Additional Comments: Please contact me with any questions of if this plan should need to change.    Melissa Cervantes, CCM, CMA [facility]) Case Manager, [facility]  [phone] 09/09/2023, 9:47 AM

## 2023-09-09 NOTE — Anesthesia Postprocedure Evaluation (Signed)
 Anesthesia Post Note  Patient: Ysabella Babiarz  Procedure(s) Performed: ARTHROPLASTY, HIP, TOTAL, ANTERIOR APPROACH (Right: Hip)     Patient location during evaluation: PACU Anesthesia Type: Regional, MAC and Spinal Level of consciousness: oriented and awake and alert Pain management: pain level controlled Vital Signs Assessment: post-procedure vital signs reviewed and stable Respiratory status: spontaneous breathing, respiratory function stable and patient connected to nasal cannula oxygen Cardiovascular status: blood pressure returned to baseline and stable Postop Assessment: no headache, no backache and no apparent nausea or vomiting Anesthetic complications: no   No notable events documented.  Last Vitals:  Vitals:   09/09/23 1430 09/09/23 1445  BP: (!) 104/48 (!) 137/59  Pulse: 75 77  Resp: 12 12  Temp:    SpO2: 97% 93%    Last Pain:  Vitals:   09/09/23 0943  TempSrc:   PainSc: Asleep            L Sensory Level: L5-Outer lower leg, top of foot, great toe (09/09/23 1445) R Sensory Level: L5-Outer lower leg, top of foot, great toe (09/09/23 1445)  Olamide Carattini

## 2023-09-09 NOTE — Evaluation (Signed)
 Physical Therapy Evaluation Patient Details Name: Melissa Cervantes MRN: [id number] DOB: 06-Sep-1940 Today's Date: 09/09/2023  History of Present Illness  83 yo female s/p R DA THA on 09/09/23. PMH: spinal stenosis, ADHD, meniere's dz, VP shunt, L THA 2014  Clinical Impression  Pt is s/p THA resulting in the deficits listed below (see PT Problem List).  Pt very sleepy, difficult to arouse initially, awakened with mutli-modal stim. Pt then able to progress to EOB and amb~ 100' with RW and min assist. Anticipate steady progress in acute setting.   Pt will benefit from acute skilled PT to increase their independence and safety with mobility to facilitate discharge.          If plan is discharge home, recommend the following: Help with stairs or ramp for entrance;Assist for transportation;Assistance with cooking/housework;A little help with bathing/dressing/bathroom   Can travel by private vehicle        Equipment Recommendations None recommended by PT  Recommendations for Other Services       Functional Status Assessment Patient has had a recent decline in their functional status and demonstrates the ability to make significant improvements in function in a reasonable and predictable amount of time.     Precautions / Restrictions Precautions Precautions: Fall Restrictions Weight Bearing Restrictions Per Provider Order: No RLE Weight Bearing Per Provider Order: Weight bearing as tolerated      Mobility  Bed Mobility Overal bed mobility: Needs Assistance Bed Mobility: Sit to Supine, Supine to Sit     Supine to sit: Min assist Sit to supine: Min assist   General bed mobility comments: assist with LEs    Transfers Overall transfer level: Needs assistance Equipment used: Rolling walker (2 wheels) Transfers: Sit to/from Stand Sit to Stand: Min assist           General transfer comment: cues for hand placement    Ambulation/Gait Ambulation/Gait assistance: Min  assist Gait Distance (Feet): 100 Feet Assistive device: Rolling walker (2 wheels) Gait Pattern/deviations: Step-through pattern       General Gait Details: assist to steady, cues for RW safety  Stairs            Wheelchair Mobility     Tilt Bed    Modified Rankin (Stroke Patients Only)       Balance Overall balance assessment: Mild deficits observed, not formally tested                                           Pertinent Vitals/Pain Pain Assessment Pain Assessment: 0-10 Pain Score: 2  Pain Location: R hip Pain Descriptors / Indicators: Sore Pain Intervention(s): Monitored during session, Limited activity within patient's tolerance, Repositioned    Home Living Family/patient expects to be discharged to:: Private residence Living Arrangements: Alone Available Help at Discharge: Family             Home Equipment: Agricultural consultant (2 wheels) Additional Comments: dtr to assist x1 wk and then nbor/friend [employer]    Prior Function Prior Level of Function : Independent/Modified Independent             Mobility Comments: limited info d/t pt HOH and unable to locate hearing aides       Extremity/Trunk Assessment   Upper Extremity Assessment Upper Extremity Assessment: Overall WFL for tasks assessed    Lower Extremity Assessment Lower Extremity Assessment: RLE deficits/detail RLE Deficits /  Details: ankle WFL, knee and hip limited buy anticipated post op deficits, grossly 3/5    Cervical / Trunk Assessment Cervical / Trunk Assessment: Normal  Communication   Communication Communication: Impaired Factors Affecting Communication: Hearing impaired (noramlly wears hearign aids)    Cognition Arousal: Alert Behavior During Therapy: WFL for tasks assessed/performed   PT - Cognitive impairments: No apparent impairments                         Following commands: Intact       Cueing Cueing Techniques: Verbal cues      General Comments      Exercises Total Joint Exercises Ankle Circles/Pumps: AROM, Both, 5 reps   Assessment/Plan    PT Assessment Patient needs continued PT services  PT Problem List Decreased strength;Decreased activity tolerance;Decreased balance;Decreased knowledge of use of DME;Pain;Decreased mobility       PT Treatment Interventions Functional mobility training;Patient/family education;Therapeutic activities;Gait training;DME instruction;Therapeutic exercise    PT Goals (Current goals can be found in the Care Plan section)  Acute Rehab PT Goals PT Goal Formulation: With patient Time For Goal Achievement: 09/16/23 Potential to Achieve Goals: Good    Frequency 7X/week     Co-evaluation               AM-PAC PT 6 Clicks Mobility  Outcome Measure Help needed turning from your back to your side while in a flat bed without using bedrails?: A Little Help needed moving from lying on your back to sitting on the side of a flat bed without using bedrails?: A Little Help needed moving to and from a bed to a chair (including a wheelchair)?: A Little Help needed standing up from a chair using your arms (e.g., wheelchair or bedside chair)?: A Little Help needed to walk in hospital room?: A Little Help needed climbing 3-5 steps with a railing? : A Lot 6 Click Score: 17    End of Session Equipment Utilized During Treatment: Gait belt Activity Tolerance: Patient tolerated treatment well Patient left: with call bell/phone within reach;with bed alarm set;in bed   PT Visit Diagnosis: Other abnormalities of gait and mobility (R26.89)    Time: 8254-8195 PT Time Calculation (min) (ACUTE ONLY): 19 min   Charges:   PT Evaluation $PT Eval Low Complexity: 1 Low   PT General Charges $$ ACUTE PT VISIT: 1 Visit         Jonte Wollam, PT  Acute Rehab Dept ([facility]) [phone]  09/09/2023   [facility] 09/09/2023, 6:08 PM

## 2023-09-09 NOTE — Interval H&P Note (Signed)
 History and Physical Interval Note:  09/09/2023 9:38 AM  Melissa Cervantes  has presented today for surgery, with the diagnosis of Right hip osteoarthritis.  The various methods of treatment have been discussed with the patient and family. After consideration of risks, benefits and other options for treatment, the patient has consented to  Procedure(s): ARTHROPLASTY, HIP, TOTAL, ANTERIOR APPROACH (Right) as a surgical intervention.  The patient's history has been reviewed, patient examined, no change in status, stable for surgery.  I have reviewed the patient's chart and labs.  Questions were answered to the patient's satisfaction.     Donnice JONETTA Car

## 2023-09-10 ENCOUNTER — Other Ambulatory Visit (HOSPITAL_BASED_OUTPATIENT_CLINIC_OR_DEPARTMENT_OTHER): Payer: Self-pay

## 2023-09-10 ENCOUNTER — Other Ambulatory Visit (HOSPITAL_COMMUNITY): Payer: Self-pay

## 2023-09-10 ENCOUNTER — Encounter (HOSPITAL_COMMUNITY): Payer: Self-pay | Admitting: Orthopedic Surgery

## 2023-09-10 DIAGNOSIS — M1611 Unilateral primary osteoarthritis, right hip: Secondary | ICD-10-CM | POA: Diagnosis not present

## 2023-09-10 LAB — BASIC METABOLIC PANEL WITH GFR
Anion gap: 12 (ref 5–15)
BUN: 23 mg/dL (ref 8–23)
CO2: 23 mmol/L (ref 22–32)
Calcium: 9.1 mg/dL (ref 8.9–10.3)
Chloride: 101 mmol/L (ref 98–111)
Creatinine, Ser: 0.72 mg/dL (ref 0.44–1.00)
GFR, Estimated: >60 mL/min (ref >60)
Glucose, Bld: 134 mg/dL — ABNORMAL HIGH (ref 70–99)
Potassium: 4.2 mmol/L (ref 3.5–5.1)
Sodium: 136 mmol/L (ref 135–145)

## 2023-09-10 LAB — CBC
HCT: 32.1 % — ABNORMAL LOW (ref 36.0–46.0)
Hemoglobin: 10.5 g/dL — ABNORMAL LOW (ref 12.0–15.0)
MCH: 32.1 pg (ref 26.0–34.0)
MCHC: 32.7 g/dL (ref 30.0–36.0)
MCV: 98.2 fL (ref 80.0–100.0)
Platelets: 284 K/uL (ref 150–400)
RBC: 3.27 MIL/uL — ABNORMAL LOW (ref 3.87–5.11)
RDW: 12.7 % (ref 11.5–15.5)
WBC: 17.1 K/uL — ABNORMAL HIGH (ref 4.0–10.5)
nRBC: 0 % (ref 0.0–0.2)

## 2023-09-10 MED ORDER — ASPIRIN 81 MG PO CHEW
81.0000 mg | CHEWABLE_TABLET | Freq: Two times a day (BID) | ORAL | 0 refills | Status: AC
  Filled 2023-09-10: qty 56, 28d supply, fill #0

## 2023-09-10 MED ORDER — SENNA 8.6 MG PO TABS
2.0000 | ORAL_TABLET | Freq: Every day | ORAL | 0 refills | Status: AC
  Filled 2023-09-10: qty 28, 14d supply, fill #0

## 2023-09-10 MED ORDER — METHOCARBAMOL 500 MG PO TABS
500.0000 mg | ORAL_TABLET | Freq: Four times a day (QID) | ORAL | 2 refills | Status: DC | PRN
  Filled 2023-09-10: qty 40, 10d supply, fill #0

## 2023-09-10 MED ORDER — OXYCODONE HCL 5 MG PO TABS
2.5000 mg | ORAL_TABLET | ORAL | 0 refills | Status: DC | PRN
  Filled 2023-09-10: qty 42, 7d supply, fill #0

## 2023-09-10 MED ORDER — POLYETHYLENE GLYCOL 3350 17 G PO PACK: 17.0000 g | PACK | Freq: Two times a day (BID) | ORAL | Status: AC

## 2023-09-10 NOTE — TOC Transition Note (Signed)
 Transition of Care [facility]) - Discharge Note   Patient Details  Name: Melissa Cervantes MRN: [id number] Date of Birth: Aug 19, 1940  [facility] CM/SW Contact:  [name], RN Phone Number: 09/10/2023, 10:34 AM   Clinical Narrative:  Met with patient at bedside to review dc therapy and home equipment needs, pt confirmed HEP, has RW, no home DME needs. MOON completed. No TOC needs.      Final next level of care: Home/Self Care Barriers to Discharge: No Barriers Identified   Patient Goals and CMS Choice Patient states their goals for this hospitalization and ongoing recovery are:: return home          Discharge Placement                       Discharge Plan and Services Additional resources added to the After Visit Summary for                  DME Arranged: N/A                    Social Drivers of Health (SDOH) Interventions SDOH Screenings   Food Insecurity: No Food Insecurity (09/09/2023)  Housing: Low Risk  (09/09/2023)  Transportation Needs: No Transportation Needs (09/09/2023)  Utilities: Not At Risk (09/09/2023)  Alcohol Screen: Low Risk  (05/24/2022)  Depression (PHQ2-9): Low Risk  (05/24/2022)  Financial Resource Strain: Low Risk  (05/24/2022)  Physical Activity: Sufficiently Active (05/24/2022)  Social Connections: Socially Isolated (09/09/2023)  Stress: No Stress Concern Present (05/24/2022)  Tobacco Use: Medium Risk (09/09/2023)     Readmission Risk Interventions     No data to display

## 2023-09-10 NOTE — Progress Notes (Signed)
 PT TX note  09/10/23 1416  PT Visit Information  Last PT Received On 09/10/23  Assistance Needed Pt continues to progress, dtr present for session. Pt is meeting goals and is ready for d/c with family assist prn from PT standpoint.  History of Present Illness 83 yo female s/p R DA THA on 09/09/23. PMH: spinal stenosis, ADHD, meniere's dz, VP shunt, L THA 2014  Subjective Data  Patient Stated Goal have better balance and walk more  Precautions  Precautions Fall  Recall of Precautions/Restrictions Intact  Restrictions  Weight Bearing Restrictions Per Provider Order No  Other Position/Activity Restrictions WBAT  Pain Assessment  Pain Assessment 0-10  Pain Score 3  Pain Location R hip  Pain Descriptors / Indicators Sore;Tightness  Pain Intervention(s) Limited activity within patient's tolerance;Monitored during session;Repositioned  Cognition  Arousal Alert  Behavior During Therapy WFL for tasks assessed/performed  PT - Cognitive impairments No apparent impairments  Following Commands  Following commands Intact  Cueing  Cueing Techniques Verbal cues  Communication  Communication Impaired  Factors Affecting Communication Hearing impaired  Bed Mobility  General bed mobility comments in recliner on arrival  Transfers  Overall transfer level Needs assistance  Equipment used Rolling walker (2 wheels)  Transfers Sit to/from Stand  Sit to Stand Contact guard assist;Supervision  General transfer comment ongoign education  for hand placement and overall safety  Ambulation/Gait  Ambulation/Gait assistance Supervision;Contact guard assist  Gait Distance (Feet) 120 Feet  Assistive device Rolling walker (2 wheels)  Gait Pattern/deviations Step-through pattern  General Gait Details CGA to supervision for safety, cues to keep feet inside RW during turns  Stairs Yes  Stairs assistance Contact guard assist;Min assist  Stair Management No rails;Step to pattern;Forwards;With walker  Number of  Stairs 2  General stair comments cues for sequence and technique, pt dtr present and able to assist/cue as needed. pt steady, no LOB  Balance  Overall balance assessment Mild deficits observed, not formally tested  PT - End of Session  Equipment Utilized During Treatment Gait belt  Activity Tolerance Patient tolerated treatment well;No increased pain  Patient left in chair;with call bell/phone within reach;with family/visitor present  Nurse Communication Mobility status   PT - Assessment/Plan  PT Visit Diagnosis Other abnormalities of gait and mobility (R26.89)  PT Frequency (ACUTE ONLY) 7X/week  Follow Up Recommendations Follow physician's recommendations for discharge plan and follow up therapies  Patient can return home with the following Help with stairs or ramp for entrance;Assist for transportation;Assistance with cooking/housework;A little help with bathing/dressing/bathroom  PT equipment None recommended by PT  AM-PAC PT 6 Clicks Mobility Outcome Measure (Version 2)  Help needed turning from your back to your side while in a flat bed without using bedrails? 3  Help needed moving from lying on your back to sitting on the side of a flat bed without using bedrails? 3  Help needed moving to and from a bed to a chair (including a wheelchair)? 3  Help needed standing up from a chair using your arms (e.g., wheelchair or bedside chair)? 3  Help needed to walk in hospital room? 3  Help needed climbing 3-5 steps with a railing?  3  6 Click Score 18  Consider Recommendation of Discharge To: Home with [facility]  Progressive Mobility  What is the highest level of mobility based on the mobility assessment? Level 4 (Ambulates with assistance) - Balance while stepping forward/back - Complete  Activity Stood at bedside;Moved bed into chair position  PT Goal Progression  Progress towards PT goals Progressing toward goals  Acute Rehab PT Goals  PT Goal Formulation With patient  Time For Goal  Achievement 09/16/23  Potential to Achieve Goals Good  PT Time Calculation  PT Start Time (ACUTE ONLY) 1338  PT Stop Time (ACUTE ONLY) 1358  PT Time Calculation (min) (ACUTE ONLY) 20 min  PT General Charges  $$ ACUTE PT VISIT 1 Visit  PT Treatments  $Gait Training 8-22 mins

## 2023-09-10 NOTE — Progress Notes (Signed)
 Physical Therapy Treatment Patient Details Name: Melissa Cervantes MRN: [id number] DOB: 11-17-1940 Today's Date: 09/10/2023   History of Present Illness 83 yo female s/p R DA THA on 09/09/23. PMH: spinal stenosis, ADHD, meniere's dz, VP shunt, L THA 2014    PT Comments  Pt progressing well, reviewed mobility, safety, HEP/progression as well as avoiding increasing activity too rapidly.  Slight vaulting noted on LLE, pt repeatedly verbalized RLE feels longer reassured that leg lengths were measured as equal per op note. reviewed potential reasons regarding this effect such as  possible shortening of joint space/arthritis prior to surgery, potential misalignment of kinetic chain d/t changes in muscle length/connective tissues occurring d/t unconscious compensatory strategies utilized 2*pain prior to THA. Pt verbalized understanding. Pt advised to review after a few wks with MD if it should persist. Will see for a second visit for stair training this pm.    If plan is discharge home, recommend the following: Help with stairs or ramp for entrance;Assist for transportation;Assistance with cooking/housework;A little help with bathing/dressing/bathroom   Can travel by private vehicle        Equipment Recommendations  None recommended by PT    Recommendations for Other Services       Precautions / Restrictions Precautions Precautions: Fall Recall of Precautions/Restrictions: Intact Restrictions Weight Bearing Restrictions Per Provider Order: No Other Position/Activity Restrictions: WBAT     Mobility  Bed Mobility               General bed mobility comments: in recliner on arrival    Transfers Overall transfer level: Needs assistance Equipment used: Rolling walker (2 wheels) Transfers: Sit to/from Stand Sit to Stand: Contact guard assist           General transfer comment: cues for hand placement    Ambulation/Gait Ambulation/Gait assistance: Supervision, Contact guard  assist Gait Distance (Feet): 100 Feet Assistive device: Rolling walker (2 wheels) Gait Pattern/deviations: Step-through pattern       General Gait Details: CGA to supervision for safety, slight vaulting noted on LLE, pt verbalizes RLE feels longer -reviewed potential reasons regarding shortening of joint space/arthritis prior to surgery as well as misalignnment d/t changes in muscle length/connective tissues-advised to review after a few wks with MD if it persists.   Stairs             Wheelchair Mobility     Tilt Bed    Modified Rankin (Stroke Patients Only)       Balance Overall balance assessment: Mild deficits observed, not formally tested                                          Communication Communication Communication: Impaired Factors Affecting Communication: Hearing impaired  Cognition Arousal: Alert Behavior During Therapy: WFL for tasks assessed/performed   PT - Cognitive impairments: No apparent impairments                         Following commands: Intact      Cueing Cueing Techniques: Verbal cues  Exercises Total Joint Exercises Ankle Circles/Pumps: AROM, Both, 5 reps Quad Sets: AROM, Strengthening, Both, 5 reps Heel Slides: Right, 20 reps, AROM Hip ABduction/ADduction: AAROM, AROM, Strengthening, Right, 10 reps    General Comments        Pertinent Vitals/Pain Pain Assessment Pain Assessment: 0-10 Pain Score: 3  Pain Location: R hip  Pain Descriptors / Indicators: Sore Pain Intervention(s): Limited activity within patient's tolerance, Monitored during session, Premedicated before session, Repositioned    Home Living                          Prior Function            PT Goals (current goals can now be found in the care plan section) Acute Rehab PT Goals Patient Stated Goal: have better balance and walk more PT Goal Formulation: With patient Time For Goal Achievement: 09/16/23 Potential to  Achieve Goals: Good Progress towards PT goals: Progressing toward goals    Frequency    7X/week      PT Plan      Co-evaluation              AM-PAC PT 6 Clicks Mobility   Outcome Measure  Help needed turning from your back to your side while in a flat bed without using bedrails?: A Little Help needed moving from lying on your back to sitting on the side of a flat bed without using bedrails?: A Little Help needed moving to and from a bed to a chair (including a wheelchair)?: A Little Help needed standing up from a chair using your arms (e.g., wheelchair or bedside chair)?: A Little Help needed to walk in hospital room?: A Little Help needed climbing 3-5 steps with a railing? : A Little 6 Click Score: 18    End of Session Equipment Utilized During Treatment: Gait belt Activity Tolerance: Patient tolerated treatment well;No increased pain Patient left: in chair;with call bell/phone within reach;with family/visitor present Nurse Communication: Mobility status PT Visit Diagnosis: Other abnormalities of gait and mobility (R26.89)     Time: 8859-8776 PT Time Calculation (min) (ACUTE ONLY): 43 min  Charges:    $Gait Training: 23-37 mins $Therapeutic Exercise: 8-22 mins PT General Charges $$ ACUTE PT VISIT: 1 Visit                     Hendrix Console, PT  Acute Rehab Dept ([facility]) [phone]  09/10/2023    [facility] 09/10/2023, 2:38 PM

## 2023-09-10 NOTE — Progress Notes (Signed)
   Subjective: 1 Day Post-Op Procedure(s) (LRB): ARTHROPLASTY, HIP, TOTAL, ANTERIOR APPROACH (Right) Patient reports pain as mild.   Patient seen in rounds with Dr. Ernie. Patient is resting in bed on exam this morning. Family at the bedside. No acute events overnight. Foley catheter removed. Patient ambulated 100 feet with PT yesterday. We will start therapy today.   Objective: Vital signs in last 24 hours: Temp:  [97.5 F (36.4 C)-98.1 F (36.7 C)] 97.6 F (36.4 C) (08/06 0531) Pulse Rate:  [61-87] 76 (08/06 0531) Resp:  [11-18] 18 (08/06 0531) BP: (79-157)/(38-74) 123/68 (08/06 0531) SpO2:  [89 %-100 %] 100 % (08/06 0531) Weight:  [53.5 kg] 53.5 kg (08/05 0905)  Intake/Output from previous day:  Intake/Output Summary (Last 24 hours) at 09/10/2023 0735 Last data filed at 09/10/2023 0715 Gross per 24 hour  Intake 2064.35 ml  Output 2225 ml  Net -160.65 ml     Intake/Output this shift: Total I/O In: 434.7 [I.V.:434.7] Out: -   Labs: Recent Labs    09/10/23 0350  HGB 10.5*   Recent Labs    09/10/23 0350  WBC 17.1*  RBC 3.27*  HCT 32.1*  PLT 284   Recent Labs    09/10/23 0350  NA 136  K 4.2  CL 101  CO2 23  BUN 23  CREATININE 0.72  GLUCOSE 134*  CALCIUM  9.1   No results for input(s): LABPT, INR in the last 72 hours.  Exam: General - Patient is Alert and Oriented Extremity - Neurologically intact Sensation intact distally Intact pulses distally Dorsiflexion/Plantar flexion intact Dressing - dressing C/D/I Motor Function - intact, moving foot and toes well on exam.   Past Medical History:  Diagnosis Date   Achalasia    ADHD    Allergy    Anxiety    Cervical dystonia    Chicken pox    Esophageal obstruction    per patient she is unable to eat solid food   Hearing loss    Hypertension    Meniere disease    Neuromuscular disorder (HCC)    toricollis  gets Botox injections q 3-34 months   Osteoarthritis    Spinal stenosis      Assessment/Plan: 1 Day Post-Op Procedure(s) (LRB): ARTHROPLASTY, HIP, TOTAL, ANTERIOR APPROACH (Right) Principal Problem:   S/P total right hip arthroplasty  Estimated body mass index is 22.3 kg/m as calculated from the following:   Height as of this encounter: 5' 1 (1.549 m).   Weight as of this encounter: 53.5 kg. Advance diet Up with therapy D/C IV fluids  DVT Prophylaxis - Aspirin  Weight bearing as tolerated.  Hgb stable at 10.5 this AM  Plan is to go Home after hospital stay. Plan for discharge today after meeting goals with therapy. Follow up in the office in 2 weeks.   Rosina Calin, PA-C Orthopedic Surgery [phone] 09/10/2023, 7:35 AM

## 2023-09-10 NOTE — Care Management Important Message (Signed)
 Important Message  Patient Details  Name: Melissa Cervantes MRN: [id number] Date of Birth: 05-03-1940   Important Message Given:        [name], RN 09/10/2023, 10:08 AM

## 2023-09-10 NOTE — Care Management Obs Status (Signed)
 MEDICARE OBSERVATION STATUS NOTIFICATION   Patient Details  Name: Melissa Cervantes MRN: [id number] Date of Birth: [birth year]   Medicare Observation Status Notification Given:  Yes    [name], RN 09/10/2023, 10:14 AM

## 2023-09-10 NOTE — Progress Notes (Signed)
 Discharge medications delivered to patient at bedside [facility]

## 2023-09-10 NOTE — Plan of Care (Signed)

## 2023-09-11 ENCOUNTER — Other Ambulatory Visit (HOSPITAL_COMMUNITY): Payer: Self-pay

## 2023-09-14 ENCOUNTER — Other Ambulatory Visit: Payer: Self-pay | Admitting: Adult Health

## 2023-09-15 ENCOUNTER — Other Ambulatory Visit: Payer: Self-pay

## 2023-09-15 ENCOUNTER — Other Ambulatory Visit: Payer: Self-pay | Admitting: Adult Health

## 2023-09-15 ENCOUNTER — Other Ambulatory Visit (HOSPITAL_COMMUNITY): Payer: Self-pay

## 2023-09-15 ENCOUNTER — Other Ambulatory Visit (HOSPITAL_BASED_OUTPATIENT_CLINIC_OR_DEPARTMENT_OTHER): Payer: Self-pay

## 2023-09-15 MED ORDER — TIZANIDINE HCL 2 MG PO TABS
2.0000 mg | ORAL_TABLET | Freq: Every evening | ORAL | 0 refills | Status: DC
  Filled 2023-09-15: qty 30, 15d supply, fill #0

## 2023-09-16 ENCOUNTER — Encounter: Payer: Self-pay | Admitting: Pharmacist

## 2023-09-16 ENCOUNTER — Other Ambulatory Visit: Payer: Self-pay

## 2023-09-16 NOTE — Discharge Summary (Signed)
 Patient ID: Melissa Cervantes MRN: [id number] DOB/AGE: 1940/11/19 83 y.o.  Admit date: 09/09/2023 Discharge date: 09/10/2023  Admission Diagnoses:  Right hip osteoarthritis  Discharge Diagnoses:  Principal Problem:   S/P total right hip arthroplasty   Past Medical History:  Diagnosis Date   Achalasia    ADHD    Allergy    Anxiety    Cervical dystonia    Chicken pox    Esophageal obstruction    per patient she is unable to eat solid food   Hearing loss    Hypertension    Meniere disease    Neuromuscular disorder (HCC)    toricollis  gets Botox injections q 3-34 months   Osteoarthritis    Spinal stenosis     Surgeries: Procedure(s): ARTHROPLASTY, HIP, TOTAL, ANTERIOR APPROACH on 09/09/2023   Consultants:   Discharged Condition: Improved  Hospital Course: Melissa Cervantes is an 83 y.o. female who was admitted 09/09/2023 for operative treatment ofS/P total right hip arthroplasty. Patient has severe unremitting pain that affects sleep, daily activities, and work/hobbies. After pre-op clearance the patient was taken to the operating room on 09/09/2023 and underwent  Procedure(s): ARTHROPLASTY, HIP, TOTAL, ANTERIOR APPROACH.    Patient was given perioperative antibiotics:  Anti-infectives (From admission, onward)    Start     Dose/Rate Route Frequency Ordered Stop   09/09/23 1700  ceFAZolin  (ANCEF ) IVPB 2g/100 mL premix        2 g 200 mL/hr over 30 Minutes Intravenous Every 6 hours 09/09/23 1459 09/10/23 0019   09/09/23 0915  ceFAZolin  (ANCEF ) IVPB 2g/100 mL premix        2 g 200 mL/hr over 30 Minutes Intravenous On call to O.R. 09/09/23 0900 09/09/23 1102        Patient was given sequential compression devices, early ambulation, and chemoprophylaxis to prevent DVT. Patient worked with PT and was meeting their goals regarding safe ambulation and transfers.  Patient benefited maximally from hospital stay and there were no complications.    Recent vital signs: No data found.    Recent laboratory studies: No results for input(s): WBC, HGB, HCT, PLT, NA, K, CL, CO2, BUN, CREATININE, GLUCOSE, INR, CALCIUM  in the last 72 hours.  Invalid input(s): PT, 2   Discharge Medications:   Allergies as of 09/10/2023       Reactions   Lipitor [atorvastatin ] Other (See Comments)   Fatigue and muscle aches    Latex Dermatitis   Other    Seasonal allergies, horses, cats and mold        Medication List     STOP taking these medications    diclofenac  75 MG EC tablet Commonly known as: VOLTAREN    HYDROcodone-acetaminophen  5-325 MG tablet Commonly known as: NORCO/VICODIN       TAKE these medications    albuterol  108 (90 Base) MCG/ACT inhaler Commonly known as: VENTOLIN  HFA INHALE 1-2 PUFFS BY MOUTH EVERY 6 HOURS AS NEEDED FOR WHEEZE OR SHORTNESS OF BREATH   amphetamine -dextroamphetamine  15 MG tablet Commonly known as: Adderall Take 1 tablet by mouth 2 (two) times daily.   amphetamine -dextroamphetamine  15 MG tablet Commonly known as: Adderall Take 1 tablet by mouth 2 (two) times daily.   amphetamine -dextroamphetamine  15 MG tablet Commonly known as: Adderall Take 1 tablet by mouth 2 (two) times daily.   Arnuity Ellipta  100 MCG/ACT Aepb Generic drug: Fluticasone  Furoate INHALE 1 PUFF BY MOUTH EVERY DAY What changed: See the new instructions.   Aspirin  Low Dose 81 MG chewable tablet  Generic drug: aspirin  Chew 1 tablet (81 mg total) by mouth 2 (two) times daily for 28 days.   clonazePAM  0.5 MG tablet Commonly known as: KLONOPIN  Take 1 tablet (0.5 mg total) by mouth 2 (two) times a day as needed (dystonic spasms). What changed: when to take this   fluticasone  50 MCG/ACT nasal spray Commonly known as: FLONASE  Place 1 spray into both nostrils daily as needed for allergies.   lisinopril  2.5 MG tablet Commonly known as: ZESTRIL  Take 1 tablet (2.5 mg total) by mouth daily. What changed: when to take this   methocarbamol   500 MG tablet Commonly known as: ROBAXIN  Take 1 tablet (500 mg total) by mouth every 6 (six) hours as needed for muscle spasms. What changed: when to take this   multivitamin with minerals Tabs tablet Take 1 tablet by mouth in the morning. Vita Fusion Multivitamin   oxyCODONE  5 MG immediate release tablet Commonly known as: Oxy IR/ROXICODONE  Take 0.5-1 tablets (2.5-5 mg total) by mouth every 4 (four) hours as needed for severe pain (pain score 7-10).   Pain Relief 500 MG/15ML Liqd Generic drug: Acetaminophen  Take 500 mg by mouth in the morning, at noon, and at bedtime.   polyethylene glycol 17 g packet Commonly known as: MIRALAX  / GLYCOLAX  Take 17 g by mouth 2 (two) times daily for 14 days.   senna 8.6 MG Tabs tablet Commonly known as: SENOKOT Take 2 tablets (17.2 mg total) by mouth at bedtime for 14 days.   simvastatin  10 MG tablet Commonly known as: ZOCOR  Take 1 tablet (10 mg total) by mouth at bedtime.               Discharge Care Instructions  (From admission, onward)           Start     Ordered   09/10/23 0000  Change dressing       Comments: Maintain surgical dressing until follow up in the clinic. If the edges start to pull up, may reinforce with tape. If the dressing is no longer working, may remove and cover with gauze and tape, but must keep the area dry and clean.  Call with any questions or concerns.   09/10/23 0701            Diagnostic Studies: DG Pelvis Portable Result Date: 09/09/2023 CLINICAL DATA:  Status post hip arthroplasty. EXAM: PORTABLE PELVIS 1-2 VIEWS COMPARISON:  None Available. FINDINGS: Right hip arthroplasty in expected alignment. No periprosthetic lucency or fracture. Recent postsurgical change includes air and edema in the soft tissues. Previous left hip arthroplasty. IMPRESSION: Right hip arthroplasty without immediate postoperative complication. Electronically Signed   By: Andrea Gasman M.D.   On: 09/09/2023 13:36   DG HIP  UNILAT WITH PELVIS 1V RIGHT Result Date: 09/09/2023 CLINICAL DATA:  Elective surgery. EXAM: DG HIP (WITH OR WITHOUT PELVIS) 1V RIGHT COMPARISON:  None Available. FINDINGS: Two fluoroscopic spot views of the pelvis and right hip obtained in the operating room. Images during hip arthroplasty. Fluoroscopy time 12 seconds. Dose 0.9466 mGy. IMPRESSION: Intraoperative fluoroscopy during right hip arthroplasty. Electronically Signed   By: Andrea Gasman M.D.   On: 09/09/2023 13:35   DG C-Arm 1-60 Min-No Report Result Date: 09/09/2023 Fluoroscopy was utilized by the requesting physician.  No radiographic interpretation.    Disposition: Discharge disposition: 01-Home or Self Care       Discharge Instructions     Call MD / Call 911   Complete by: As directed  If you experience chest pain or shortness of breath, CALL 911 and be transported to the hospital emergency room.  If you develope a fever above 101 F, pus (white drainage) or increased drainage or redness at the wound, or calf pain, call your surgeon's office.   Change dressing   Complete by: As directed    Maintain surgical dressing until follow up in the clinic. If the edges start to pull up, may reinforce with tape. If the dressing is no longer working, may remove and cover with gauze and tape, but must keep the area dry and clean.  Call with any questions or concerns.   Constipation Prevention   Complete by: As directed    Drink plenty of fluids.  Prune juice may be helpful.  You may use a stool softener, such as Colace (over the counter) 100 mg twice a day.  Use MiraLax  (over the counter) for constipation as needed.   Diet - low sodium heart healthy   Complete by: As directed    Increase activity slowly as tolerated   Complete by: As directed    Weight bearing as tolerated with assist device (walker, cane, etc) as directed, use it as long as suggested by your surgeon or therapist, typically at least 4-6 weeks.   Post-operative opioid  taper instructions:   Complete by: As directed    POST-OPERATIVE OPIOID TAPER INSTRUCTIONS: It is important to wean off of your opioid medication as soon as possible. If you do not need pain medication after your surgery it is ok to stop day one. Opioids include: Codeine, Hydrocodone(Norco, Vicodin), Oxycodone (Percocet, oxycontin ) and hydromorphone  amongst others.  Long term and even short term use of opiods can cause: Increased pain response Dependence Constipation Depression Respiratory depression And more.  Withdrawal symptoms can include Flu like symptoms Nausea, vomiting And more Techniques to manage these symptoms Hydrate well Eat regular healthy meals Stay active Use relaxation techniques(deep breathing, meditating, yoga) Do Not substitute Alcohol to help with tapering If you have been on opioids for less than two weeks and do not have pain than it is ok to stop all together.  Plan to wean off of opioids This plan should start within one week post op of your joint replacement. Maintain the same interval or time between taking each dose and first decrease the dose.  Cut the total daily intake of opioids by one tablet each day Next start to increase the time between doses. The last dose that should be eliminated is the evening dose.      TED hose   Complete by: As directed    Use stockings (TED hose) for 2 weeks on both leg(s).  You may remove them at night for sleeping.        Follow-up Information     Patti Rosina SAUNDERS, PA-C. Go on 09/24/2023.   Specialty: Orthopedic Surgery Why: You are scheduled for first postop appointment on Wednesday August 20 at 3:30pm. Contact information: [address] [place] [address] [phone]                  Signed: Rosina SAUNDERS Patti 09/16/2023, 10:37 AM

## 2023-09-17 ENCOUNTER — Other Ambulatory Visit (HOSPITAL_COMMUNITY): Payer: Self-pay

## 2023-09-17 ENCOUNTER — Other Ambulatory Visit: Payer: Self-pay | Admitting: Adult Health

## 2023-09-17 DIAGNOSIS — I10 Essential (primary) hypertension: Secondary | ICD-10-CM

## 2023-09-21 ENCOUNTER — Encounter: Payer: Self-pay | Admitting: Adult Health

## 2023-09-21 DIAGNOSIS — I10 Essential (primary) hypertension: Secondary | ICD-10-CM

## 2023-09-24 ENCOUNTER — Other Ambulatory Visit (HOSPITAL_COMMUNITY): Payer: Self-pay

## 2023-09-24 ENCOUNTER — Other Ambulatory Visit (HOSPITAL_BASED_OUTPATIENT_CLINIC_OR_DEPARTMENT_OTHER): Payer: Self-pay

## 2023-09-24 MED ORDER — SIMVASTATIN 10 MG PO TABS
10.0000 mg | ORAL_TABLET | Freq: Every day | ORAL | 0 refills | Status: DC
  Filled 2023-09-24: qty 90, 90d supply, fill #0

## 2023-09-24 MED ORDER — LISINOPRIL 2.5 MG PO TABS
2.5000 mg | ORAL_TABLET | Freq: Every evening | ORAL | 0 refills | Status: DC
  Filled 2023-09-24 – 2023-11-28 (×3): qty 90, 90d supply, fill #0

## 2023-09-30 ENCOUNTER — Encounter: Payer: Self-pay | Admitting: Adult Health

## 2023-09-30 ENCOUNTER — Other Ambulatory Visit (HOSPITAL_COMMUNITY): Payer: Self-pay

## 2023-10-01 ENCOUNTER — Other Ambulatory Visit: Payer: Self-pay

## 2023-10-01 ENCOUNTER — Other Ambulatory Visit (HOSPITAL_BASED_OUTPATIENT_CLINIC_OR_DEPARTMENT_OTHER): Payer: Self-pay

## 2023-10-01 ENCOUNTER — Other Ambulatory Visit: Payer: Self-pay | Admitting: Adult Health

## 2023-10-01 DIAGNOSIS — F909 Attention-deficit hyperactivity disorder, unspecified type: Secondary | ICD-10-CM

## 2023-10-01 MED ORDER — AMPHETAMINE-DEXTROAMPHETAMINE 15 MG PO TABS
15.0000 mg | ORAL_TABLET | Freq: Two times a day (BID) | ORAL | 0 refills | Status: DC
  Filled 2023-10-01 – 2023-10-31 (×2): qty 60, 30d supply, fill #0

## 2023-10-01 MED ORDER — AMPHETAMINE-DEXTROAMPHETAMINE 15 MG PO TABS
15.0000 mg | ORAL_TABLET | Freq: Two times a day (BID) | ORAL | 0 refills | Status: DC
  Filled 2023-10-01: qty 60, 30d supply, fill #0

## 2023-10-01 MED ORDER — AMPHETAMINE-DEXTROAMPHETAMINE 15 MG PO TABS
15.0000 mg | ORAL_TABLET | Freq: Two times a day (BID) | ORAL | 0 refills | Status: DC
  Filled 2023-10-01 (×2): qty 60, 30d supply, fill #0

## 2023-10-01 NOTE — Telephone Encounter (Signed)
 Ok to fill? Pt has some that was sent in but looks like the Rx expired.

## 2023-10-09 ENCOUNTER — Encounter: Payer: Self-pay | Admitting: Adult Health

## 2023-10-09 DIAGNOSIS — H9191 Unspecified hearing loss, right ear: Secondary | ICD-10-CM

## 2023-10-23 ENCOUNTER — Telehealth (INDEPENDENT_AMBULATORY_CARE_PROVIDER_SITE_OTHER): Payer: Self-pay

## 2023-10-23 ENCOUNTER — Encounter (INDEPENDENT_AMBULATORY_CARE_PROVIDER_SITE_OTHER): Payer: Self-pay

## 2023-10-23 NOTE — Telephone Encounter (Signed)
 Pt called back stating she was returning your phone call.

## 2023-10-28 ENCOUNTER — Encounter (INDEPENDENT_AMBULATORY_CARE_PROVIDER_SITE_OTHER): Payer: Self-pay | Admitting: Physician Assistant

## 2023-10-28 ENCOUNTER — Ambulatory Visit (INDEPENDENT_AMBULATORY_CARE_PROVIDER_SITE_OTHER): Admitting: Physician Assistant

## 2023-10-28 VITALS — BP 164/80 | HR 86

## 2023-10-28 DIAGNOSIS — H9190 Unspecified hearing loss, unspecified ear: Secondary | ICD-10-CM | POA: Diagnosis not present

## 2023-10-28 DIAGNOSIS — H9121 Sudden idiopathic hearing loss, right ear: Secondary | ICD-10-CM

## 2023-10-28 NOTE — Progress Notes (Signed)
 Dear Dr. Isaias, Here is my assessment for our mutual patient, Melissa Cervantes. Thank you for allowing me the opportunity to care for your patient. Please do not hesitate to contact me should you have any other questions. Sincerely, Chyrl Cohen PA-C  Otolaryngology Clinic Note Referring provider: Dr. Isaias HPI:  Melissa Cervantes is a 83 y.o. female kindly referred by Dr. Isaias   The patient is an 83 year old female seen in our office for evaluation of hearing loss.  The patient notes a significant past medical history of left-sided Mnire's disease.  She notes several decades ago she had nearly complete loss of hearing in her left ear, this was rapid in nature.  She was seen by several ENTs and audiologist with the eventual diagnosis of Mnire's disease.  She notes a progressive slow loss of hearing in the right ear.  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  On August 26 she had Botox injections for cervical dystonia and on August 29 she had rapid onset of decreased hearing in the right ear.  She notes prior to this she did have a very soft tonal tinnitus, subsequently after losing the hearing she has had some persistent tinnitus that has worsened recently into a loud mechanical sound.  She notes no significant changes in her hearing since the original onset.  She denies any associated dizziness, no associated neurologic deficits, no trauma to the ear, no infectious signs or symptoms.  She did have audiological evaluation in March 2025 that in hearing, she had follow-up audiological evaluation on 10/21/2023 again.  She was referred to our office for the sensorineural hearing loss.      Independent Review of Additional Tests or Records:        Profound left sensorineural hearing loss, significant reduction in right sided sensorineural hearing loss comparing March study to August study    PMH/Meds/All/SocHx/FamHx/ROS:   Past Medical  History:  Diagnosis Date   Achalasia    ADHD    Allergy    Anxiety    Cervical dystonia    Chicken pox    Esophageal obstruction    per patient she is unable to eat solid food   Hearing loss    Hypertension    Meniere disease    Neuromuscular disorder (HCC)    toricollis  gets Botox injections q 3-34 months   Osteoarthritis    Spinal stenosis      Past Surgical History:  Procedure Laterality Date   BALLOON DILATION N/A 01/30/2021   Procedure: BALLOON DILATION;  Surgeon: Shila Gustav GAILS, MD;  Location: [facility];  Service: Endoscopy;  Laterality: N/A;   BRAIN SURGERY  07/02/2023   Right frontal VP shunt   CESAREAN SECTION  1971, 1979   CYST EXCISION     Pilonidal cyst removed   ESOPHAGEAL BRUSHING  01/30/2021   Procedure: ESOPHAGEAL BRUSHING;  Surgeon: Shila Gustav GAILS, MD;  Location: [facility];  Service: Endoscopy;;   ESOPHAGOGASTRODUODENOSCOPY (EGD) WITH PROPOFOL  N/A 01/30/2021   Procedure: ESOPHAGOGASTRODUODENOSCOPY (EGD) WITH PROPOFOL ;  Surgeon: Shila Gustav GAILS, MD;  Location: [facility];  Service: Endoscopy;  Laterality: N/A;   EYE SURGERY Bilateral    cataract removal   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   NISSEN FUNDOPLICATION  2007   TOTAL HIP ARTHROPLASTY Left 06/2012   TOTAL HIP ARTHROPLASTY Right 09/09/2023   Procedure: ARTHROPLASTY, HIP, TOTAL, ANTERIOR APPROACH;  Surgeon: Ernie Cough, MD;  Location: [facility];  Service: Orthopedics;  Laterality: Right;    Family History  Problem Relation Age of Onset   Heart disease Mother    Heart disease Father    Alcohol abuse Father    Dystonia Sister    Dystonia Brother    Colon cancer Neg Hx    Esophageal cancer Neg Hx    Stomach cancer Neg Hx      Social Connections: Socially Isolated (09/09/2023)   Social Connection and Isolation Panel    Frequency of Communication with Friends and Family: More than three times a week    Frequency of Social Gatherings  with Friends and Family: More than three times a week    Attends Religious Services: Never    Database administrator or Organizations: No    Attends Banker Meetings: Never    Marital Status: Divorced      Current Outpatient Medications:    Acetaminophen  (PAIN RELIEF) 167 MG/5ML LIQD, Take 500 mg by mouth in the morning, at noon, and at bedtime., Disp: , Rfl:    albuterol  (VENTOLIN  HFA) 108 (90 Base) MCG/ACT inhaler, INHALE 1-2 PUFFS BY MOUTH EVERY 6 HOURS AS NEEDED FOR WHEEZE OR SHORTNESS OF BREATH, Disp: 8.5 each, Rfl: 3   amphetamine -dextroamphetamine  (ADDERALL) 15 MG tablet, Take 1 tablet by mouth 2 (two) times daily., Disp: 60 tablet, Rfl: 0   amphetamine -dextroamphetamine  (ADDERALL) 15 MG tablet, Take 1 tablet by mouth 2 (two) times daily., Disp: 60 tablet, Rfl: 0   amphetamine -dextroamphetamine  (ADDERALL) 15 MG tablet, Take 1 tablet by mouth 2 (two) times daily., Disp: 60 tablet, Rfl: 0   clonazePAM  (KLONOPIN ) 0.5 MG tablet, Take 1 tablet (0.5 mg total) by mouth 2 (two) times a day as needed (dystonic spasms). (Patient taking differently: Take 0.5 mg by mouth at bedtime.), Disp: 60 tablet, Rfl: 3   fluticasone  (FLONASE ) 50 MCG/ACT nasal spray, Place 1 spray into both nostrils daily as needed for allergies., Disp: , Rfl:    Fluticasone  Furoate (ARNUITY ELLIPTA ) 100 MCG/ACT AEPB, INHALE 1 PUFF BY MOUTH EVERY DAY (Patient taking differently: Inhale 1 puff into the lungs daily as needed (respiratory issues.).), Disp: 30 each, Rfl: 0   lisinopril  (ZESTRIL ) 2.5 MG tablet, Take 1 tablet (2.5 mg total) by mouth every evening., Disp: 90 tablet, Rfl: 0   methocarbamol  (ROBAXIN ) 500 MG tablet, Take 1 tablet (500 mg total) by mouth every 6 (six) hours as needed for muscle spasms., Disp: 40 tablet, Rfl: 2   Multiple Vitamin (MULTIVITAMIN WITH MINERALS) TABS tablet, Take 1 tablet by mouth in the morning. Vita Fusion Multivitamin, Disp: , Rfl:    oxyCODONE  (OXY IR/ROXICODONE ) 5 MG immediate  release tablet, Take 0.5-1 tablets (2.5-5 mg total) by mouth every 4 (four) hours as needed for severe pain (pain score 7-10)., Disp: 42 tablet, Rfl: 0   simvastatin  (ZOCOR ) 10 MG tablet, Take 1 tablet (10 mg total) by mouth at bedtime., Disp: 90 tablet, Rfl: 0   tiZANidine  (ZANAFLEX ) 2 MG tablet, Take 1-2 tablets (2-4 mg total) by mouth every evening for sleep/muscle pain., Disp: 30 tablet, Rfl: 0   Physical Exam:   BP (!) 164/80 Comment: first attempt 164/80 second attempt 143/80  Pulse 86   SpO2 97%   Pertinent Findings  CN II-XII intact Bilateral EAC clear and TM intact with well pneumatized middle ear spaces Anterior rhinoscopy: Septum midline; bilateral inferior turbinates with no hypertrophy No lesions of oral cavity/oropharynx; No obviously palpable neck masses/lymphadenopathy/thyromegaly No respiratory distress  or stridor  Seprately Identifiable Procedures:  None  Impression & Plans:  Melissa Cervantes is a 83 y.o. female with the following   Sudden hearing loss-  83 year old female with a history of baseline profound left sensorineural hearing loss with abrupt drop in right sided sensorineural hearing loss over a short period of time.  Fortunately this happened over 1 month ago, no significant changes since that time.  She had no associated dizziness, no infectious signs or symptoms, no trauma.  I do not feel that steroids would be significantly beneficial when weighing the risks and benefits.  I do feel that an MR IAC would be of value to ensure no retrocochlear pathology.  The patient does have a VP shunt, I have advised her to reach out to radiology for protocol on shunt management.  The order has been placed, she will reach out to my office if she develops any new or worsening signs or symptoms or have any trouble obtaining the MRI.  I do also recommend a 1 month follow-up audiogram.   - f/u phone call discussion with MRI results   Thank you for allowing me the opportunity  to care for your patient. Please do not hesitate to contact me should you have any other questions.  Sincerely, Chyrl Cohen PA-C [facility] Phone: [phone] Fax: [phone]  10/28/2023, 11:44 AM

## 2023-10-28 NOTE — Patient Instructions (Signed)
 I have ordered an imaging study for you to complete prior to your next visit. Please call [facility] Scheduling at [phone] to schedule your imaging if you have not received a call within 24 hours. If you are unable to complete your imaging study prior to your next scheduled visit please call our office to let us  know.

## 2023-10-30 ENCOUNTER — Encounter (INDEPENDENT_AMBULATORY_CARE_PROVIDER_SITE_OTHER): Payer: Self-pay

## 2023-10-30 ENCOUNTER — Other Ambulatory Visit: Payer: Self-pay | Admitting: Adult Health

## 2023-10-30 DIAGNOSIS — F909 Attention-deficit hyperactivity disorder, unspecified type: Secondary | ICD-10-CM

## 2023-10-30 NOTE — Telephone Encounter (Unsigned)
 Copied from CRM [id number]. Topic: Clinical - Medication Refill >> Oct 30, 2023  5:25 PM Dedra B wrote: Medication: amphetamine -dextroamphetamine  (ADDERALL) 15 MG tablet  Has the patient contacted their pharmacy? No  This is the patient's preferred pharmacy:  [facility] [address] [place] [address] Phone: [phone] Fax: [phone]  Is this the correct pharmacy for this prescription? Yes  Has the prescription been filled recently? No  Is the patient out of the medication? No, will be out tomorrow  Has the patient been seen for an appointment in the last year OR does the patient have an upcoming appointment? Yes  Can we respond through MyChart? Yes  Agent: Please be advised that Rx refills may take up to 3 business days. We ask that you follow-up with your pharmacy.

## 2023-10-31 ENCOUNTER — Other Ambulatory Visit (HOSPITAL_BASED_OUTPATIENT_CLINIC_OR_DEPARTMENT_OTHER): Payer: Self-pay

## 2023-10-31 ENCOUNTER — Other Ambulatory Visit: Payer: Self-pay | Admitting: Adult Health

## 2023-10-31 NOTE — Telephone Encounter (Signed)
 Noted

## 2023-10-31 NOTE — Telephone Encounter (Signed)
 Deja, could you please let her know that if she feels uncomfortable with their recommendations that she should reach out to the neurosurgical practice that placed the shunt. They will be able to give her clear guidance on her shunt and any pre/post MRI monitoring or adjustments that may need to be made. Thanks

## 2023-10-31 NOTE — Telephone Encounter (Signed)
 Okay for refill?

## 2023-11-03 ENCOUNTER — Ambulatory Visit (HOSPITAL_COMMUNITY): Admission: RE | Admit: 2023-11-03 | Source: Ambulatory Visit

## 2023-11-08 ENCOUNTER — Emergency Department (HOSPITAL_BASED_OUTPATIENT_CLINIC_OR_DEPARTMENT_OTHER)
Admission: EM | Admit: 2023-11-08 | Discharge: 2023-11-08 | Disposition: A | Discharge status: Home / Self Care | Attending: Emergency Medicine | Admitting: Emergency Medicine

## 2023-11-08 ENCOUNTER — Encounter (HOSPITAL_BASED_OUTPATIENT_CLINIC_OR_DEPARTMENT_OTHER): Payer: Self-pay | Admitting: Emergency Medicine

## 2023-11-08 ENCOUNTER — Emergency Department (HOSPITAL_BASED_OUTPATIENT_CLINIC_OR_DEPARTMENT_OTHER)

## 2023-11-08 DIAGNOSIS — S61411A Laceration without foreign body of right hand, initial encounter: Secondary | ICD-10-CM | POA: Insufficient documentation

## 2023-11-08 DIAGNOSIS — S0990XA Unspecified injury of head, initial encounter: Secondary | ICD-10-CM | POA: Diagnosis present

## 2023-11-08 DIAGNOSIS — W01198A Fall on same level from slipping, tripping and stumbling with subsequent striking against other object, initial encounter: Secondary | ICD-10-CM | POA: Diagnosis not present

## 2023-11-08 DIAGNOSIS — S60512A Abrasion of left hand, initial encounter: Secondary | ICD-10-CM | POA: Insufficient documentation

## 2023-11-08 DIAGNOSIS — S80211A Abrasion, right knee, initial encounter: Secondary | ICD-10-CM | POA: Insufficient documentation

## 2023-11-08 DIAGNOSIS — Z91040 Latex allergy status: Secondary | ICD-10-CM | POA: Insufficient documentation

## 2023-11-08 DIAGNOSIS — S0181XA Laceration without foreign body of other part of head, initial encounter: Secondary | ICD-10-CM | POA: Insufficient documentation

## 2023-11-08 DIAGNOSIS — S80212A Abrasion, left knee, initial encounter: Secondary | ICD-10-CM | POA: Insufficient documentation

## 2023-11-08 DIAGNOSIS — W19XXXA Unspecified fall, initial encounter: Secondary | ICD-10-CM

## 2023-11-08 DIAGNOSIS — Z79899 Other long term (current) drug therapy: Secondary | ICD-10-CM | POA: Diagnosis not present

## 2023-11-08 DIAGNOSIS — I10 Essential (primary) hypertension: Secondary | ICD-10-CM | POA: Diagnosis not present

## 2023-11-08 MED ORDER — LIDOCAINE-EPINEPHRINE (PF) 2 %-1:200000 IJ SOLN
10.0000 mL | Freq: Once | INTRAMUSCULAR | Status: AC
  Administered 2023-11-08: 10 mL
  Filled 2023-11-08: qty 20

## 2023-11-08 NOTE — ED Triage Notes (Signed)
 Pt endorses mechanical fall today onto asphalat, denies thinners. Reports RT side NPH Shunt in may, hip repl in aug.Pt c/o HA, lac noted to LT forehead. Bleeding controlled. Swelling noted. Small lac to Rt anterior hand. Also reports Bil knee pain, skin tears note bilaterally. Abrasion to RT cheek. Last tetanus unknown

## 2023-11-08 NOTE — ED Notes (Addendum)
 EDP at beside for laceration repair

## 2023-11-08 NOTE — Discharge Instructions (Signed)
 The sutures can come out in about 5 days.

## 2023-11-08 NOTE — ED Provider Notes (Signed)
 [place] EMERGENCY DEPARTMENT AT [facility] Provider Note   CSN: [id number] Arrival date & time: 11/08/23  1805     Patient presents with: Melissa Cervantes is a 83 y.o. female.    Fall  Patient presents after fall.  Lost her balance and tripped fell forward.  Hematoma to right head.  Also pain on hands and did have knee abrasions.  Denies loss of consciousness.  Not on blood thinners.    Past Medical History:  Diagnosis Date   Achalasia    ADHD    Allergy    Anxiety    Cervical dystonia    Chicken pox    Esophageal obstruction    per patient she is unable to eat solid food   Hearing loss    Hypertension    Meniere disease    Neuromuscular disorder (HCC)    toricollis  gets Botox injections q 3-34 months   Osteoarthritis    Spinal stenosis   ' Prior to Admission medications   Medication Sig Start Date End Date Taking? Authorizing Provider  Acetaminophen  (PAIN RELIEF) 167 MG/5ML LIQD Take 500 mg by mouth in the morning, at noon, and at bedtime.    [provider]  albuterol  (VENTOLIN  HFA) 108 (90 Base) MCG/ACT inhaler INHALE 1-2 PUFFS BY MOUTH EVERY 6 HOURS AS NEEDED FOR WHEEZE OR SHORTNESS OF BREATH 10/02/21   Nafziger, Darleene, NP  amphetamine -dextroamphetamine  (ADDERALL) 15 MG tablet Take 1 tablet by mouth 2 (two) times daily. 10/01/23 10/31/23  Nafziger, Darleene, NP  amphetamine -dextroamphetamine  (ADDERALL) 15 MG tablet Take 1 tablet by mouth 2 (two) times daily. 10/01/23 11/30/23  Nafziger, Darleene, NP  amphetamine -dextroamphetamine  (ADDERALL) 15 MG tablet Take 1 tablet by mouth 2 (two) times daily. 10/01/23 10/31/23  Nafziger, Darleene, NP  clonazePAM  (KLONOPIN ) 0.5 MG tablet Take 1 tablet (0.5 mg total) by mouth 2 (two) times a day as needed (dystonic spasms). Patient taking differently: Take 0.5 mg by mouth at bedtime. 06/06/23     fluticasone  (FLONASE ) 50 MCG/ACT nasal spray Place 1 spray into both nostrils daily as needed for allergies. 09/19/21    [provider]  Fluticasone  Furoate (ARNUITY ELLIPTA ) 100 MCG/ACT AEPB INHALE 1 PUFF BY MOUTH EVERY DAY Patient taking differently: Inhale 1 puff into the lungs daily as needed (respiratory issues.). 05/08/23   Desai, Nikita S, MD  lisinopril  (ZESTRIL ) 2.5 MG tablet Take 1 tablet (2.5 mg total) by mouth every evening. 09/24/23   Nafziger, Darleene, NP  methocarbamol  (ROBAXIN ) 500 MG tablet Take 1 tablet (500 mg total) by mouth every 6 (six) hours as needed for muscle spasms. 09/10/23   Patti Rosina SAUNDERS, PA-C  Multiple Vitamin (MULTIVITAMIN WITH MINERALS) TABS tablet Take 1 tablet by mouth in the morning. Vita Fusion Multivitamin    [provider]  oxyCODONE  (OXY IR/ROXICODONE ) 5 MG immediate release tablet Take 0.5-1 tablets (2.5-5 mg total) by mouth every 4 (four) hours as needed for severe pain (pain score 7-10). 09/10/23   Patti Rosina SAUNDERS, PA-C  simvastatin  (ZOCOR ) 10 MG tablet Take 1 tablet (10 mg total) by mouth at bedtime. 09/24/23   Nafziger, Darleene, NP  tiZANidine  (ZANAFLEX ) 2 MG tablet Take 1-2 tablets (2-4 mg total) by mouth every evening for sleep/muscle pain. 09/15/23       Allergies: Lipitor [atorvastatin ], Latex, and Other    Review of Systems  Updated Vital Signs BP (!) 186/87 (BP Location: Right Arm)   Pulse 86   Temp 98 F (36.7 C) (  Oral)   Resp 20   Wt 53.1 kg   SpO2 100%   BMI 22.11 kg/m   Physical Exam Vitals and nursing note reviewed.  HENT:     Head:     Comments: Laceration to right upper forehead.  Approximately 1 1/2 cm long.  Face otherwise nontender.  Eye movements intact.  No cervical spine tenderness.    Ears:     Comments: Patient is hard of hearing. Cardiovascular:     Rate and Rhythm: Regular rhythm.  Chest:     Chest wall: No tenderness.  Abdominal:     Tenderness: There is no abdominal tenderness.  Musculoskeletal:     Comments: Abrasions to palms both hands but particular on the right side has small superficial laceration.  Underlying  tenderness.  No wrist tenderness.  Abrasions to bilateral knees but no underlying bony tenderness.  No tenderness to distal phalanx of right index finger.  Skin:    Capillary Refill: Capillary refill takes less than 2 seconds.  Neurological:     Mental Status: She is alert. Mental status is at baseline.     (all labs ordered are listed, but only abnormal results are displayed) Labs Reviewed - No data to display  EKG: None  Radiology: DG Hand Complete Right Result Date: 11/08/2023 CLINICAL DATA:  Fall, injury to hand.  Pain. EXAM: RIGHT HAND - COMPLETE 3+ VIEW COMPARISON:  None Available. FINDINGS: Advanced osteoarthritis. There is a lucency through dorsal osteophyte of the index finger at the distal interphalangeal joint that may represent chronic fragmentation or osteophyte fracture. No other fracture of the hand. No joint dislocation. Chondrocalcinosis of the triangular fibrocartilage. IMPRESSION: 1. Lucency through dorsal osteophyte of the index finger at the distal interphalangeal joint may represent chronic fragmentation or osteophyte fracture. Recommend correlation for focal tenderness. 2. Advanced osteoarthritis. Electronically Signed   By: Andrea Gasman M.D.   On: 11/08/2023 19:56   CT Head Wo Contrast Result Date: 11/08/2023 CLINICAL DATA:  Trauma, fall EXAM: CT HEAD WITHOUT CONTRAST CT CERVICAL SPINE WITHOUT CONTRAST TECHNIQUE: Multidetector CT imaging of the head and cervical spine was performed following the standard protocol without intravenous contrast. Multiplanar CT image reconstructions of the cervical spine were also generated. RADIATION DOSE REDUCTION: This exam was performed according to the departmental dose-optimization program which includes automated exposure control, adjustment of the mA and/or kV according to patient size and/or use of iterative reconstruction technique. COMPARISON:  11/02/2022 FINDINGS: CT HEAD FINDINGS Brain: No evidence of acute infarction,  hemorrhage, hydrocephalus, extra-axial collection or mass lesion/mass effect. 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 Periventricular white matter hypodensity. Vascular: No hyperdense vessel or unexpected calcification. Skull: Normal. Negative for fracture or focal lesion. Sinuses/Orbits: No acute finding. Other: None. CT CERVICAL SPINE FINDINGS Alignment: Degenerative straightening of the normal cervical lordosis. Skull base and vertebrae: No acute fracture. No primary bone lesion or focal pathologic process. Soft tissues and spinal canal: No prevertebral fluid or swelling. No visible canal hematoma. Disc levels: Moderate multilevel cervical disc degenerative disease and bridging osteophytosis. Upper chest: Negative. Other: None. IMPRESSION: 1. No acute intracranial pathology. 2. 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 3. 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 4. No fracture or static subluxation of the cervical spine. 5. Moderate multilevel cervical disc degenerative disease and bridging osteophytosis. Electronically Signed   By: Marolyn JONETTA Marlyce CHRISTELLA.D.  On: 11/08/2023 19:32   CT Cervical Spine Wo Contrast Result Date: 11/08/2023 CLINICAL DATA:  Trauma, fall EXAM: CT HEAD WITHOUT CONTRAST CT CERVICAL SPINE WITHOUT CONTRAST TECHNIQUE: Multidetector CT imaging of the head and cervical spine was performed following the standard protocol without intravenous contrast. Multiplanar CT image reconstructions of the cervical spine were also generated. RADIATION DOSE REDUCTION: This exam was performed according to the departmental dose-optimization program which includes automated exposure control, adjustment of the mA and/or kV according to patient size and/or use of iterative reconstruction  technique. COMPARISON:  11/02/2022 FINDINGS: CT HEAD FINDINGS Brain: No evidence of acute infarction, hemorrhage, hydrocephalus, extra-axial collection or mass lesion/mass effect. 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 Periventricular white matter hypodensity. Vascular: No hyperdense vessel or unexpected calcification. Skull: Normal. Negative for fracture or focal lesion. Sinuses/Orbits: No acute finding. Other: None. CT CERVICAL SPINE FINDINGS Alignment: Degenerative straightening of the normal cervical lordosis. Skull base and vertebrae: No acute fracture. No primary bone lesion or focal pathologic process. Soft tissues and spinal canal: No prevertebral fluid or swelling. No visible canal hematoma. Disc levels: Moderate multilevel cervical disc degenerative disease and bridging osteophytosis. Upper chest: Negative. Other: None. IMPRESSION: 1. No acute intracranial pathology. 2. 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 3. 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 4. No fracture or static subluxation of the cervical spine. 5. Moderate multilevel cervical disc degenerative disease and bridging osteophytosis. Electronically Signed   By: Marolyn JONETTA Jaksch M.D.   On: 11/08/2023 19:32     .Laceration Repair  Date/Time: 11/08/2023 9:30 PM  Performed by: Patsey Lot, MD Authorized by: Patsey Lot, MD   Consent:    Consent obtained:  Verbal   Consent given by:  Patient   Risks, benefits, and alternatives were discussed: yes     Risks discussed:  Pain, retained foreign body, infection, need for additional repair, nerve damage, poor cosmetic result and poor wound healing   Alternatives discussed:  No treatment Universal protocol:    Patient identity confirmed:  Verbally with  patient Anesthesia:    Anesthesia method:  Local infiltration   Local anesthetic:  Lidocaine  2% WITH epi Laceration details:    Location:  Face   Face location:  Forehead   Length (cm):  1 Pre-procedure details:    Preparation:  Patient was prepped and draped in usual sterile fashion Exploration:    Limited defect created (wound extended): no     Hemostasis achieved with:  Epinephrine    Wound exploration: wound explored through full range of motion     Contaminated: no   Treatment:    Area cleansed with:  Saline   Amount of cleaning:  Standard Skin repair:    Repair method:  Sutures   Suture size:  5-0   Suture material:  Chromic gut   Suture technique:  Simple interrupted   Number of sutures:  4 Approximation:    Approximation:  Close Repair type:    Repair type:  Simple Post-procedure details:    Dressing:  Sterile dressing   Procedure completion:  Tolerated well, no immediate complications    Medications Ordered in the ED  lidocaine -EPINEPHrine  (XYLOCAINE  W/EPI) 2 %-1:200000 (PF) injection 10 mL (10 mLs Infiltration Given by Other 11/08/23 2104)  Medical Decision Making Amount and/or Complexity of Data Reviewed Radiology: ordered.  Risk Prescription drug management.   Patient mechanical fall.  Hit head.  Will get head CT and cervical spine CT.  Also right hand x-ray.  Hand x-ray negative for fracture and likely chronic change of finger.  Will close wound.  Workup reassuring.  Wound closed.  Sutures on head and did have some Steri-Strips on hand.  Follow-up with PCP.       Final diagnoses:  Fall, initial encounter  Facial laceration, initial encounter    ED Discharge Orders     None          Patsey Lot, MD 11/08/23 2208

## 2023-11-11 ENCOUNTER — Other Ambulatory Visit (HOSPITAL_BASED_OUTPATIENT_CLINIC_OR_DEPARTMENT_OTHER): Payer: Self-pay

## 2023-11-11 MED ORDER — COMIRNATY 30 MCG/0.3ML IM SUSY
0.3000 mL | PREFILLED_SYRINGE | Freq: Once | INTRAMUSCULAR | 0 refills | Status: AC
  Filled 2023-11-11: qty 0.3, 1d supply, fill #0

## 2023-11-21 ENCOUNTER — Other Ambulatory Visit (HOSPITAL_BASED_OUTPATIENT_CLINIC_OR_DEPARTMENT_OTHER): Payer: Self-pay

## 2023-11-25 ENCOUNTER — Other Ambulatory Visit (HOSPITAL_COMMUNITY): Payer: Self-pay

## 2023-11-25 ENCOUNTER — Other Ambulatory Visit: Payer: Self-pay

## 2023-11-26 ENCOUNTER — Other Ambulatory Visit (HOSPITAL_BASED_OUTPATIENT_CLINIC_OR_DEPARTMENT_OTHER): Payer: Self-pay

## 2023-11-26 ENCOUNTER — Other Ambulatory Visit (HOSPITAL_COMMUNITY): Payer: Self-pay

## 2023-11-28 ENCOUNTER — Other Ambulatory Visit (HOSPITAL_BASED_OUTPATIENT_CLINIC_OR_DEPARTMENT_OTHER): Payer: Self-pay

## 2023-11-30 ENCOUNTER — Ambulatory Visit (HOSPITAL_COMMUNITY)
Admission: EM | Admit: 2023-11-30 | Discharge: 2023-11-30 | Disposition: A | Discharge status: Home / Self Care | Attending: Family Medicine | Admitting: Family Medicine

## 2023-11-30 ENCOUNTER — Encounter (HOSPITAL_COMMUNITY): Payer: Self-pay | Admitting: Emergency Medicine

## 2023-11-30 ENCOUNTER — Ambulatory Visit (INDEPENDENT_AMBULATORY_CARE_PROVIDER_SITE_OTHER)

## 2023-11-30 DIAGNOSIS — S8992XA Unspecified injury of left lower leg, initial encounter: Secondary | ICD-10-CM

## 2023-11-30 DIAGNOSIS — S99922A Unspecified injury of left foot, initial encounter: Secondary | ICD-10-CM | POA: Diagnosis not present

## 2023-11-30 DIAGNOSIS — R030 Elevated blood-pressure reading, without diagnosis of hypertension: Secondary | ICD-10-CM

## 2023-11-30 DIAGNOSIS — I10 Essential (primary) hypertension: Secondary | ICD-10-CM

## 2023-11-30 DIAGNOSIS — L03116 Cellulitis of left lower limb: Secondary | ICD-10-CM | POA: Diagnosis not present

## 2023-11-30 MED ORDER — TETANUS-DIPHTH-ACELL PERTUSSIS 5-2-15.5 LF-MCG/0.5 IM SUSP: 0.5000 mL | Freq: Once | INTRAMUSCULAR | Status: DC

## 2023-11-30 MED ORDER — CEPHALEXIN 500 MG PO CAPS: 500.0000 mg | ORAL_CAPSULE | Freq: Two times a day (BID) | ORAL | 0 refills | Status: DC

## 2023-11-30 NOTE — ED Triage Notes (Signed)
 10 days ago was in a tool shed and was lifting this heavy gate. Pt reports fell down her left leg. Reports cleaned up the blood and trying to doctor it herself at home. Reports still having dried blood on bandages when changes them. Reports there is some swelling and hot to touch. Was Using antibiotic cream.

## 2023-11-30 NOTE — Discharge Instructions (Signed)
 Nice seeing you today. Sorry about your skin infection.  I am starting you on Keflex twice daily for 7 days. Use Tylenol  as needed for pain. Your xray is negative for fracture or dislocation.  I advised an ED visit for IV antibiotics if symptoms worsen despite oral antibiotic therapy and possibly for wound debridement. Your BP is also elevated. Please see your PCP soon for BP management.

## 2023-11-30 NOTE — ED Provider Notes (Signed)
 [facility]    CSN: [id number] Arrival date & time: 11/30/23  1231      History   Chief Complaint No chief complaint on file.   HPI Melissa Cervantes is a 83 y.o. female.   The history is provided by the patient. No language interpreter was used.  Wound Check This is a new (she had a wooden frame fall on her left shin 10 days ago. Since then she tried to clean it and care for it, but worsening.) problem. The problem has been gradually worsening (The next morning, she has leg and ankle swelling which persists. Her pain is about 5/10 in severity with some tingling. Feels the swelling has improved, but it remains hot and red). Pertinent negatives include no headaches. Associated symptoms comments: No fever. Weight bearing is difficult. Treatments tried: Antibiotic ointment and self-dressing.  Hypertension This is a chronic (She thinks she took her BP meds last night. Denies headache, no vision change recently) problem. Pertinent negatives include no headaches.    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   Dysphagia    Spinal stenosis 06/09/2020   Meniere disease    ADHD    Achalasia    Osteoarthritis    Arthritis of right hip 05/24/2020   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   Degenerative arthritis of right knee 04/21/2020    Past Surgical History:  Procedure Laterality Date   BALLOON DILATION N/A 01/30/2021   Procedure: BALLOON DILATION;  Surgeon: Shila Gustav GAILS, MD;  Location:  [facility];  Service: Endoscopy;  Laterality: N/A;   BRAIN SURGERY  07/02/2023   Right frontal VP shunt   CESAREAN SECTION  1971, 1979   CYST EXCISION     Pilonidal cyst removed   ESOPHAGEAL BRUSHING  01/30/2021   Procedure: ESOPHAGEAL BRUSHING;  Surgeon: Shila Gustav GAILS, MD;  Location: [facility];  Service: Endoscopy;;   ESOPHAGOGASTRODUODENOSCOPY (EGD) WITH PROPOFOL  N/A 01/30/2021   Procedure: ESOPHAGOGASTRODUODENOSCOPY (EGD) WITH PROPOFOL ;  Surgeon: Shila Gustav GAILS, MD;  Location: [facility];  Service: Endoscopy;  Laterality: N/A;   EYE SURGERY Bilateral    cataract removal   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   NISSEN FUNDOPLICATION  2007   TOTAL HIP ARTHROPLASTY Left 06/2012   TOTAL HIP ARTHROPLASTY Right 09/09/2023   Procedure: ARTHROPLASTY, HIP, TOTAL, ANTERIOR APPROACH;  Surgeon: Ernie Cough, MD;  Location: [facility];  Service: Orthopedics;  Laterality: Right;    OB History   No obstetric history on file.      Home Medications    Prior to Admission medications   Medication Sig Start Date End Date Taking? Authorizing Provider  Acetaminophen  (PAIN RELIEF) 167 MG/5ML LIQD Take 500 mg by mouth in the morning, at noon, and at bedtime.    [provider]  albuterol  (VENTOLIN  HFA) 108 (90 Base) MCG/ACT inhaler  INHALE 1-2 PUFFS BY MOUTH EVERY 6 HOURS AS NEEDED FOR WHEEZE OR SHORTNESS OF BREATH 10/02/21   Nafziger, Darleene, NP  amphetamine -dextroamphetamine  (ADDERALL) 15 MG tablet Take 1 tablet by mouth 2 (two) times daily. 10/01/23 10/31/23  Nafziger, Darleene, NP  amphetamine -dextroamphetamine  (ADDERALL) 15 MG tablet Take 1 tablet by mouth 2 (two) times daily. 10/01/23 11/30/23  Nafziger, Darleene, NP  amphetamine -dextroamphetamine  (ADDERALL) 15 MG tablet Take 1 tablet by mouth 2 (two) times daily. 10/01/23 10/31/23  Nafziger, Darleene, NP  clonazePAM  (KLONOPIN ) 0.5 MG tablet Take 1 tablet (0.5 mg total) by mouth 2 (two) times a day as  needed (dystonic spasms). Patient taking differently: Take 0.5 mg by mouth at bedtime. 06/06/23     fluticasone  (FLONASE ) 50 MCG/ACT nasal spray Place 1 spray into both nostrils daily as needed for allergies. 09/19/21   [provider]  Fluticasone  Furoate (ARNUITY ELLIPTA ) 100 MCG/ACT AEPB INHALE 1 PUFF BY MOUTH EVERY DAY Patient taking differently: Inhale 1 puff into the lungs daily as needed (respiratory issues.). 05/08/23   Desai, Nikita S, MD  lisinopril  (ZESTRIL ) 2.5 MG tablet Take 1 tablet (2.5 mg total) by mouth every evening. 09/24/23   Nafziger, Darleene, NP  methocarbamol  (ROBAXIN ) 500 MG tablet Take 1 tablet (500 mg total) by mouth every 6 (six) hours as needed for muscle spasms. 09/10/23   Patti Rosina SAUNDERS, PA-C  Multiple Vitamin (MULTIVITAMIN WITH MINERALS) TABS tablet Take 1 tablet by mouth in the morning. V[employer] Fusion Multivitamin    [provider]  oxyCODONE  (OXY IR/ROXICODONE ) 5 MG immediate release tablet Take 0.5-1 tablets (2.5-5 mg total) by mouth every 4 (four) hours as needed for severe pain (pain score 7-10). 09/10/23   Patti Rosina SAUNDERS, PA-C  simvastatin  (ZOCOR ) 10 MG tablet Take 1 tablet (10 mg total) by mouth at bedtime. 09/24/23   Nafziger, Darleene, NP  tiZANidine  (ZANAFLEX ) 2 MG tablet Take 1-2 tablets (2-4 mg total) by mouth every evening for sleep/muscle pain. 09/15/23       Family History Family History  Problem Relation Age of Onset   Heart disease Mother    Heart disease Father    Alcohol abuse Father    Dystonia Sister    Dystonia Brother    Colon cancer Neg Hx    Esophageal cancer Neg Hx    Stomach cancer Neg Hx     Social History Social History   Tobacco Use   Smoking status: Former   Smokeless tobacco: Never  [employer]   Vaping status: Never Used  Substance Use Topics   Alcohol use: Yes    Comment: 4   Drug use: Not Currently    Types: Marijuana     Allergies   Lipitor [atorvastatin ], Latex, and Other   Review of Systems Review of  Systems  Neurological:  Negative for headaches.  All other systems reviewed and are negative.    Physical Exam Triage Vital Signs ED Triage Vitals  Encounter Vitals Group     BP 11/30/23 1346 (!) 154/84     Girls Systolic BP Percentile --      Girls Diastolic BP Percentile --      Boys Systolic BP Percentile --      Boys Diastolic BP Percentile --      Pulse Rate 11/30/23 1346 84     Resp 11/30/23 1346 17     Temp 11/30/23 1346 (!) 97.5 F (36.4 C)     Temp Source 11/30/23 1346 Oral     SpO2  11/30/23 1346 97 %     Weight --      Height --      Head Circumference --      Peak Flow --      Pain Score 11/30/23 1343 5     Pain Loc --      Pain Education --      Exclude from Growth Chart --    No data found.  Updated Vital Signs BP (!) 160/81   Pulse 84   Temp (!) 97.5 F (36.4 C) (Oral)   Resp 17   SpO2 97%   Visual Acuity Right Eye Distance:   Left Eye Distance:   Bilateral Distance:    Right Eye Near:   Left Eye Near:    Bilateral Near:     Physical Exam Vitals and nursing note reviewed.  Constitutional:      General: She is not in acute distress.    Appearance: She is not toxic-appearing.  Cardiovascular:     Rate and Rhythm: Normal rate and regular rhythm.     Heart sounds: Normal heart sounds. No murmur heard. Pulmonary:     Effort: Pulmonary effort is normal. No respiratory distress.     Breath sounds: Normal breath sounds. No wheezing.  Skin:    Comments: Dry dark scabbed ulcer on her left shin, just a few inches from her ankle, over an erythematous, warm base associated with swelling. The ankle is swollen as well, with tenderness to the touch.   Neurological:     General: No focal deficit present.     Mental Status: She is alert and oriented to person, place, and time.      UC Treatments / Results  Labs (all labs ordered are listed, but only abnormal results are displayed) Labs Reviewed - No data to display  EKG   Radiology DG  Tibia/Fibula Left Result Date: 11/30/2023 EXAM: 2 VIEW(S) XRAY OF THE LEFT TIBIA AND FIBULA 11/30/2023 02:33:48 PM COMPARISON: Left knee radiographs 01/02/2021. CLINICAL HISTORY: Left foot and leg injury. 10 days ago, the patient fell on her left leg while lifting a heavy gate. The patient reports self-treating the injury at home, noting persistent dried blood on bandages, swelling, and warmth to touch. FINDINGS: BONES AND JOINTS: No acute fracture. No focal osseous lesion. No joint dislocation. Chondrocalcinosis of the knee. SOFT TISSUES: Diffuse soft tissue edema. IMPRESSION: 1. No acute fracture or dislocation. 2. Diffuse soft tissue edema. Electronically signed by: Lonni Necessary MD 11/30/2023 03:11 PM EDT RP Workstation: [id number]   DG Foot Complete Left Result Date: 11/30/2023 EXAM: 3 OR MORE VIEW(S) XRAY OF THE LEFT FOOT 11/30/2023 02:33:48 PM COMPARISON: None available. CLINICAL HISTORY: Left foot and leg injury. 10 days ago, the patient fell down her left leg while lifting a heavy gate. The patient reports self-treating at home, with persistent dried blood on bandages, swelling, and warmth to touch. FINDINGS: BONES AND JOINTS: No acute fracture. No focal osseous lesion. No joint dislocation. Small plantar calcaneal spur. Mild hallux valgus. Mild degenerative changes of the midfoot. SOFT TISSUES: Soft tissue swelling about the forefoot. IMPRESSION: 1. Soft tissue swelling about the forefoot, possibly related to injury. 2. Mild hallux valgus and mild midfoot degenerative changes. 3. Small plantar calcaneal spur. Electronically signed by: Lonni Necessary MD 11/30/2023 03:08 PM EDT RP Workstation: [id number]    Procedures Procedures (including critical care time)  Medications Ordered in UC Medications - No data to display  Initial Impression / Assessment and Plan /  UC Course  I have reviewed the triage vital signs and the nursing notes.  Pertinent labs & imaging results that were  available during my care of the patient were reviewed by me and considered in my medical decision making (see chart for details).  Clinical Course as of 11/30/23 1517  Sun Nov 30, 2023  1517 Left leg cellulitis S/P leg injury Start Keflex daily for 5 days Wound dressing completed today Documented Tdap administration in 2022, although she could not remember this Due to her inability to bear weight, an X-ray was done to r/o a fracture, which was negative. Tylenol  as needed for pain I advised an ED visit for IV antibiotics if symptoms worsen despite oral antibiotic therapy and possibly wound debridement. She agreed with the plan. [KE]  1517 HTN with elevated BP No neurologic deficit Unclear if she took her meds last night Continue home regimen and follow up with PCP soon for medication management. She agreed with the plan. [KE]    Clinical Course User Index [KE] Anders Otto DASEN, MD     Final Clinical Impressions(s) / UC Diagnoses   Final diagnoses:  Left leg cellulitis  Elevated blood pressure reading  Essential hypertension   Discharge Instructions   None    ED Prescriptions   None    PDMP not reviewed this encounter.   Anders Otto DASEN, MD 11/30/23 [phone]

## 2023-12-03 ENCOUNTER — Inpatient Hospital Stay (HOSPITAL_COMMUNITY)
Admission: EM | Admit: 2023-12-03 | Discharge: 2023-12-07 | DRG: 603 | Disposition: A | Discharge status: Home Health | Attending: Internal Medicine | Admitting: Internal Medicine

## 2023-12-03 ENCOUNTER — Encounter (HOSPITAL_COMMUNITY): Payer: Self-pay | Admitting: Emergency Medicine

## 2023-12-03 DIAGNOSIS — M1990 Unspecified osteoarthritis, unspecified site: Secondary | ICD-10-CM | POA: Diagnosis present

## 2023-12-03 DIAGNOSIS — Z9889 Other specified postprocedural states: Secondary | ICD-10-CM

## 2023-12-03 DIAGNOSIS — Z87891 Personal history of nicotine dependence: Secondary | ICD-10-CM

## 2023-12-03 DIAGNOSIS — Z98891 History of uterine scar from previous surgery: Secondary | ICD-10-CM

## 2023-12-03 DIAGNOSIS — H8109 Meniere's disease, unspecified ear: Secondary | ICD-10-CM | POA: Diagnosis present

## 2023-12-03 DIAGNOSIS — Z604 Social exclusion and rejection: Secondary | ICD-10-CM | POA: Diagnosis present

## 2023-12-03 DIAGNOSIS — L03116 Cellulitis of left lower limb: Principal | ICD-10-CM | POA: Diagnosis present

## 2023-12-03 DIAGNOSIS — Z23 Encounter for immunization: Secondary | ICD-10-CM | POA: Diagnosis present

## 2023-12-03 DIAGNOSIS — G243 Spasmodic torticollis: Secondary | ICD-10-CM | POA: Diagnosis present

## 2023-12-03 DIAGNOSIS — Z602 Problems related to living alone: Secondary | ICD-10-CM | POA: Diagnosis present

## 2023-12-03 DIAGNOSIS — Z96643 Presence of artificial hip joint, bilateral: Secondary | ICD-10-CM | POA: Diagnosis present

## 2023-12-03 DIAGNOSIS — Z79899 Other long term (current) drug therapy: Secondary | ICD-10-CM | POA: Diagnosis not present

## 2023-12-03 DIAGNOSIS — E785 Hyperlipidemia, unspecified: Secondary | ICD-10-CM | POA: Diagnosis present

## 2023-12-03 DIAGNOSIS — Z888 Allergy status to other drugs, medicaments and biological substances status: Secondary | ICD-10-CM

## 2023-12-03 DIAGNOSIS — H9190 Unspecified hearing loss, unspecified ear: Secondary | ICD-10-CM | POA: Diagnosis present

## 2023-12-03 DIAGNOSIS — Z7951 Long term (current) use of inhaled steroids: Secondary | ICD-10-CM

## 2023-12-03 DIAGNOSIS — Z8619 Personal history of other infectious and parasitic diseases: Secondary | ICD-10-CM

## 2023-12-03 DIAGNOSIS — F419 Anxiety disorder, unspecified: Secondary | ICD-10-CM | POA: Diagnosis present

## 2023-12-03 DIAGNOSIS — K222 Esophageal obstruction: Secondary | ICD-10-CM | POA: Diagnosis present

## 2023-12-03 DIAGNOSIS — Z1152 Encounter for screening for COVID-19: Secondary | ICD-10-CM | POA: Diagnosis not present

## 2023-12-03 DIAGNOSIS — F909 Attention-deficit hyperactivity disorder, unspecified type: Secondary | ICD-10-CM | POA: Diagnosis present

## 2023-12-03 DIAGNOSIS — Z8249 Family history of ischemic heart disease and other diseases of the circulatory system: Secondary | ICD-10-CM

## 2023-12-03 DIAGNOSIS — K220 Achalasia of cardia: Secondary | ICD-10-CM | POA: Diagnosis present

## 2023-12-03 DIAGNOSIS — I10 Essential (primary) hypertension: Secondary | ICD-10-CM | POA: Diagnosis present

## 2023-12-03 DIAGNOSIS — R10A2 Flank pain, left side: Secondary | ICD-10-CM | POA: Diagnosis present

## 2023-12-03 DIAGNOSIS — Z811 Family history of alcohol abuse and dependence: Secondary | ICD-10-CM

## 2023-12-03 DIAGNOSIS — Z91040 Latex allergy status: Secondary | ICD-10-CM

## 2023-12-03 DIAGNOSIS — Z982 Presence of cerebrospinal fluid drainage device: Secondary | ICD-10-CM

## 2023-12-03 DIAGNOSIS — R5381 Other malaise: Secondary | ICD-10-CM | POA: Diagnosis present

## 2023-12-03 DIAGNOSIS — M4800 Spinal stenosis, site unspecified: Secondary | ICD-10-CM | POA: Diagnosis present

## 2023-12-03 DIAGNOSIS — Z9841 Cataract extraction status, right eye: Secondary | ICD-10-CM

## 2023-12-03 DIAGNOSIS — Z9842 Cataract extraction status, left eye: Secondary | ICD-10-CM

## 2023-12-03 LAB — CBC WITH DIFFERENTIAL/PLATELET
Abs Immature Granulocytes: 0.02 K/uL (ref 0.00–0.07)
Basophils Absolute: 0.1 K/uL (ref 0.0–0.1)
Basophils Relative: 1 %
Eosinophils Absolute: 0.7 K/uL — ABNORMAL HIGH (ref 0.0–0.5)
Eosinophils Relative: 7 %
HCT: 38.5 % (ref 36.0–46.0)
Hemoglobin: 12.1 g/dL (ref 12.0–15.0)
Immature Granulocytes: 0 %
Lymphocytes Relative: 19 %
Lymphs Abs: 1.8 K/uL (ref 0.7–4.0)
MCH: 30.7 pg (ref 26.0–34.0)
MCHC: 31.4 g/dL (ref 30.0–36.0)
MCV: 97.7 fL (ref 80.0–100.0)
Monocytes Absolute: 1.2 K/uL — ABNORMAL HIGH (ref 0.1–1.0)
Monocytes Relative: 13 %
Neutro Abs: 5.7 K/uL (ref 1.7–7.7)
Neutrophils Relative %: 60 %
Platelets: 344 K/uL (ref 150–400)
RBC: 3.94 MIL/uL (ref 3.87–5.11)
RDW: 12.9 % (ref 11.5–15.5)
WBC: 9.5 K/uL (ref 4.0–10.5)
nRBC: 0 % (ref 0.0–0.2)

## 2023-12-03 LAB — BASIC METABOLIC PANEL WITH GFR
Anion gap: 12 (ref 5–15)
BUN: 16 mg/dL (ref 8–23)
CO2: 24 mmol/L (ref 22–32)
Calcium: 10 mg/dL (ref 8.9–10.3)
Chloride: 104 mmol/L (ref 98–111)
Creatinine, Ser: 0.74 mg/dL (ref 0.44–1.00)
GFR, Estimated: >60 mL/min (ref >60)
Glucose, Bld: 96 mg/dL (ref 70–99)
Potassium: 3.9 mmol/L (ref 3.5–5.1)
Sodium: 140 mmol/L (ref 135–145)

## 2023-12-03 LAB — RESP PANEL BY RT-PCR (RSV, FLU A&B, COVID)  RVPGX2
Influenza A by PCR: NEGATIVE
Influenza B by PCR: NEGATIVE
Resp Syncytial Virus by PCR: NEGATIVE
SARS Coronavirus 2 by RT PCR: NEGATIVE

## 2023-12-03 LAB — I-STAT CG4 LACTIC ACID, ED: Lactic Acid, Venous: 1.3 mmol/L (ref 0.5–1.9)

## 2023-12-03 MED ORDER — ONDANSETRON HCL 4 MG PO TABS: 4.0000 mg | ORAL_TABLET | Freq: Four times a day (QID) | ORAL | Status: DC | PRN

## 2023-12-03 MED ORDER — CLONAZEPAM 0.5 MG PO TABS
0.5000 mg | ORAL_TABLET | Freq: Two times a day (BID) | ORAL | Status: DC | PRN
  Administered 2023-12-03 – 2023-12-04 (×2): 0.5 mg via ORAL
  Filled 2023-12-03 (×3): qty 1

## 2023-12-03 MED ORDER — ACETAMINOPHEN 650 MG RE SUPP
650.0000 mg | Freq: Four times a day (QID) | RECTAL | Status: DC | PRN
  Administered 2023-12-07: 650 mg via RECTAL

## 2023-12-03 MED ORDER — ONDANSETRON HCL 4 MG/2ML IJ SOLN: 4.0000 mg | Freq: Four times a day (QID) | INTRAMUSCULAR | Status: DC | PRN

## 2023-12-03 MED ORDER — ENOXAPARIN SODIUM 40 MG/0.4ML IJ SOSY
40.0000 mg | PREFILLED_SYRINGE | INTRAMUSCULAR | Status: DC
  Administered 2023-12-03 – 2023-12-05 (×3): 40 mg via SUBCUTANEOUS
  Filled 2023-12-03 (×4): qty 0.4

## 2023-12-03 MED ORDER — LISINOPRIL 2.5 MG PO TABS
2.5000 mg | ORAL_TABLET | Freq: Every evening | ORAL | Status: DC
Start: 2023-12-03 — End: 2023-12-04
  Administered 2023-12-03: 2.5 mg via ORAL
  Filled 2023-12-03 (×3): qty 1

## 2023-12-03 MED ORDER — VANCOMYCIN HCL 750 MG/150ML IV SOLN
750.0000 mg | INTRAVENOUS | Status: AC
  Administered 2023-12-04 – 2023-12-06 (×3): 750 mg via INTRAVENOUS
  Filled 2023-12-03 (×4): qty 150

## 2023-12-03 MED ORDER — SIMVASTATIN 10 MG PO TABS
10.0000 mg | ORAL_TABLET | Freq: Every day | ORAL | Status: DC
  Administered 2023-12-03 – 2023-12-06 (×4): 10 mg via ORAL
  Filled 2023-12-03 (×4): qty 1

## 2023-12-03 MED ORDER — VANCOMYCIN HCL IN DEXTROSE 1-5 GM/200ML-% IV SOLN
1000.0000 mg | Freq: Once | INTRAVENOUS | Status: AC
  Administered 2023-12-03: 1000 mg via INTRAVENOUS
  Filled 2023-12-03: qty 200

## 2023-12-03 MED ORDER — SODIUM CHLORIDE 0.9 % IV SOLN
1.0000 g | Freq: Every day | INTRAVENOUS | Status: AC
  Administered 2023-12-03 – 2023-12-06 (×4): 1 g via INTRAVENOUS
  Filled 2023-12-03 (×4): qty 10

## 2023-12-03 MED ORDER — INFLUENZA VAC SPLIT HIGH-DOSE 0.5 ML IM SUSY
0.5000 mL | PREFILLED_SYRINGE | INTRAMUSCULAR | Status: AC
  Administered 2023-12-04: 0.5 mL via INTRAMUSCULAR
  Filled 2023-12-03: qty 0.5

## 2023-12-03 MED ORDER — OXYCODONE HCL 5 MG PO TABS: 2.5000 mg | ORAL_TABLET | Freq: Four times a day (QID) | ORAL | Status: DC | PRN

## 2023-12-03 MED ORDER — AMPHETAMINE-DEXTROAMPHETAMINE 15 MG PO TABS: 15.0000 mg | ORAL_TABLET | Freq: Two times a day (BID) | ORAL | Status: DC

## 2023-12-03 MED ORDER — ACETAMINOPHEN 325 MG PO TABS
650.0000 mg | ORAL_TABLET | Freq: Four times a day (QID) | ORAL | Status: DC | PRN
  Administered 2023-12-03 – 2023-12-05 (×5): 650 mg via ORAL
  Filled 2023-12-03 (×7): qty 2

## 2023-12-03 NOTE — ED Provider Notes (Signed)
 [name] AT [facility] Provider Note   CSN: [id number] Arrival date & time: 12/03/23  9281     Patient presents with: Wound Check   Melissa Cervantes is a 83 y.o. female with a past medical history significant for anxiety and hypertension who presents to the ED due to left lower extremity wound from 10/16 that has worsened despite antibiotics. She was seen at urgent care for cellulitis of the wound on 10/26. She has been on Keflex for 3 days and notes that the wound has worsened and erythema has spread down her leg. She has not taken anything for the pain and has kept the wound clean and covered. She does endorse mildly increased fatigue and feeling as though she may be getting a cold.She denies fever, dizziness, N/V, SOB, chest pain, cough, myalgia. She denies purulent discharge. History is aided by her niece, Melissa Cervantes), who has been tracking the worsening of the wound as well.   History obtained from patient and past medical records. No interpreter used during encounter.       Prior to Admission medications   Medication Sig Start Date End Date Taking? Authorizing Provider  Acetaminophen  (PAIN RELIEF) 167 MG/5ML LIQD Take 500 mg by mouth in the morning, at noon, and at bedtime.    [provider]  albuterol  (VENTOLIN  HFA) 108 (90 Base) MCG/ACT inhaler INHALE 1-2 PUFFS BY MOUTH EVERY 6 HOURS AS NEEDED FOR WHEEZE OR SHORTNESS OF BREATH 10/02/21   Nafziger, Darleene, NP  amphetamine -dextroamphetamine  (ADDERALL) 15 MG tablet Take 1 tablet by mouth 2 (two) times daily. 10/01/23 10/31/23  Nafziger, Cory, NP  amphetamine -dextroamphetamine  (ADDERALL) 15 MG tablet Take 1 tablet by mouth 2 (two) times daily. 10/01/23 11/30/23  Nafziger, Cory, NP  amphetamine -dextroamphetamine  (ADDERALL) 15 MG tablet Take 1 tablet by mouth 2 (two) times daily. 10/01/23 10/31/23  Nafziger, Cory, NP  cephALEXin (KEFLEX) 500 MG capsule Take 1 capsule (500 mg total) by mouth 2 (two)  times daily for 7 days. 11/30/23 12/07/23  Anders Otto DASEN, MD  clonazePAM  (KLONOPIN ) 0.5 MG tablet Take 1 tablet (0.5 mg total) by mouth 2 (two) times a day as needed (dystonic spasms). Patient taking differently: Take 0.5 mg by mouth at bedtime. 06/06/23     fluticasone  (FLONASE ) 50 MCG/ACT nasal spray Place 1 spray into both nostrils daily as needed for allergies. 09/19/21   [provider]  Fluticasone  Furoate (ARNUITY ELLIPTA ) 100 MCG/ACT AEPB INHALE 1 PUFF BY MOUTH EVERY DAY Patient taking differently: Inhale 1 puff into the lungs daily as needed (respiratory issues.). 05/08/23   Desai, Nikita S, MD  lisinopril  (ZESTRIL ) 2.5 MG tablet Take 1 tablet (2.5 mg total) by mouth every evening. 09/24/23   Nafziger, Darleene, NP  methocarbamol  (ROBAXIN ) 500 MG tablet Take 1 tablet (500 mg total) by mouth every 6 (six) hours as needed for muscle spasms. 09/10/23   Patti Rosina SAUNDERS, PA-C  Multiple Vitamin (MULTIVITAMIN WITH MINERALS) TABS tablet Take 1 tablet by mouth in the morning. Vita Fusion Multivitamin    [provider]  oxyCODONE  (OXY IR/ROXICODONE ) 5 MG immediate release tablet Take 0.5-1 tablets (2.5-5 mg total) by mouth every 4 (four) hours as needed for severe pain (pain score 7-10). 09/10/23   Patti Rosina SAUNDERS, PA-C  simvastatin  (ZOCOR ) 10 MG tablet Take 1 tablet (10 mg total) by mouth at bedtime. 09/24/23   Nafziger, Darleene, NP  tiZANidine  (ZANAFLEX ) 2 MG tablet Take 1-2 tablets (2-4 mg total) by mouth every evening  for sleep/muscle pain. 09/15/23       Allergies: Lipitor [atorvastatin ], Latex, and Other    Review of Systems  Constitutional:  Positive for fatigue. Negative for fever.  Skin:  Positive for color change and wound.    Updated Vital Signs BP (!) 160/74   Pulse 75   Temp 98.1 F (36.7 C)   Resp 16   SpO2 100%   Physical Exam Vitals and nursing note reviewed.  Constitutional:      General: She is not in acute distress.    Appearance: She is not ill-appearing.   HENT:     Head: Normocephalic.  Eyes:     Pupils: Pupils are equal, round, and reactive to light.  Cardiovascular:     Rate and Rhythm: Normal rate and regular rhythm.     Pulses: Normal pulses.     Heart sounds: Normal heart sounds. No murmur heard.    No friction rub. No gallop.  Pulmonary:     Effort: Pulmonary effort is normal.     Breath sounds: Normal breath sounds.  Abdominal:     General: Abdomen is flat. There is no distension.     Palpations: Abdomen is soft.     Tenderness: There is no abdominal tenderness. There is no guarding or rebound.  Musculoskeletal:        General: Normal range of motion.     Cervical back: Neck supple.  Skin:    General: Skin is warm and dry.     Comments: Would to left lower extremity with surrounding erythema. See photo below.   Neurological:     General: No focal deficit present.     Mental Status: She is alert.  Psychiatric:        Mood and Affect: Mood normal.        Behavior: Behavior normal.     (all labs ordered are listed, but only abnormal results are displayed) Labs Reviewed  CBC WITH DIFFERENTIAL/PLATELET - Abnormal; Notable for the following components:      Result Value   Monocytes Absolute 1.2 (*)    Eosinophils Absolute 0.7 (*)    All other components within normal limits  RESP PANEL BY RT-PCR (RSV, FLU A&B, COVID)  RVPGX2  CULTURE, BLOOD (ROUTINE X 2)  CULTURE, BLOOD (ROUTINE X 2)  BASIC METABOLIC PANEL WITH GFR  I-STAT CG4 LACTIC ACID, ED    EKG: None  Radiology: No results found.   Procedures   Medications Ordered in the ED  vancomycin (VANCOCIN) IVPB 1000 mg/200 mL premix (0 mg Intravenous Stopped 12/03/23 1024)    Clinical Course as of 12/03/23 1057  Wed Dec 03, 2023  0916 Lactic Acid, Venous: 1.3 [CA]    Clinical Course User Index [CA] Lorelle Aleck BROCKS, PA-C                                 Medical Decision Making Amount and/or Complexity of Data Reviewed Independent Historian:      Details: Niece at bedside provided history External Data Reviewed: notes.    Details: UC note Labs: ordered. Decision-making details documented in ED Course.  Risk Prescription drug management. Decision regarding hospitalization.   This patient presents to the ED for concern of wound, this involves an extensive number of treatment options, and is a complaint that carries with it a high risk of complications and morbidity.  The differential diagnosis includes cellulitis, abscess, necrotizing fasciitis, septic  joint, etc  83 year old female presents to the ED due to left lower extremity wound that occurred on 10/16.  Seen at urgent care on 10/26 and prescribed Keflex which she has been compliant with. Patient states erythema has spread further while on the antibiotic.  Admits to feeling unwell however, no documented fever.  Upon arrival patient afebrile, not tachycardic or hypoxic.  Patient in no acute distress.  Wound to left lower extremity.  No purulent drainage.  Does have surrounding erythema which appears to be worse than previous picture from urgent care.  See photo above.  Patient has been on antibiotics for greater than 48 hours feel this is likely failed outpatient therapy and patient will require IV antibiotics.  Routine labs ordered.  Added blood cultures and lactic acid.  IV vancomycin given.  Patient had x-ray done at urgent care which was negative for underlying fracture.  Added RVP due to some minor systemic symptoms.  CBC with no leukocytosis.  Normal hemoglobin.  Lactic acid normal.  BMP unremarkable.  Normal renal function.  No major electrolyte derangements.  Will discuss with hospitalist for admission for IV antibiotics for cellulitis.  No evidence of sepsis.  Blood cultures pending.  10:56 AM Discussed with Dr. Celinda with [facility] who agrees to admit patient  Co morbidities that complicate the patient evaluation  HTN, anxiety  Social Determinants of Health:  Elderly >65  Test  / Admission - Considered:  Admit for LLE cellulitis for IV antibiotics      Final diagnoses:  Cellulitis of left lower extremity    ED Discharge Orders     None          Lorelle Aleck JAYSON DEVONNA 12/03/23 1058    Rogelia Jerilynn RAMAN, MD 12/04/23 0800

## 2023-12-03 NOTE — Progress Notes (Addendum)
 Pharmacy Antibiotic Note  Melissa Cervantes is a 83 y.o. female admitted on 12/03/2023 with L LE cellulitis without purulent drainage. Noted, patient failed outpatient keflex (given for 3 days). Patient received vancomycin 1g in the ED and was also started on ceftriaxone. Pharmacy has been consulted for vancomycin dosing for total of 7 days.  - last documented TBW as 51.5kg, afebrile  Plan: Vancomycin 750mg  IV q24h (eAUC 527, Scr rounded to 0.8, Vd 0.72) x 7 days  Monitor renal function and clinical status  F/u ability to de-escalate as appropriate     Temp (24hrs), Avg:98 F (36.7 C), Min:97.8 F (36.6 C), Max:98.1 F (36.7 C)  Recent Labs  Lab 12/03/23 0822 12/03/23 0828  WBC 9.5  --   CREATININE 0.74  --   LATICACIDVEN  --  1.3    CrCl cannot be calculated (Unknown ideal weight.).    Allergies  Allergen Reactions   Lipitor [Atorvastatin ] Other (See Comments)    Fatigue and muscle aches    Latex Dermatitis   Other     Seasonal allergies, horses, cats and mold    Antimicrobials this admission: CRO 10/29 >> Vanc 10/29 >>11/4   Microbiology results: 10/29 bcx: sent   Thank you for allowing pharmacy to be a part of this patient's care.  Brittnie Lewey 12/03/2023 11:19 AM

## 2023-12-03 NOTE — ED Triage Notes (Signed)
 Pt arriving POV with niece for a lower leg wound with concern for cellulitis. Pt is hard of hearing but A&O x4. Pt reports she was working in her tool shed and a large item fell and scraped her lower leg. Area is red and swollen, currently covered with non-stick dressing.

## 2023-12-03 NOTE — Progress Notes (Signed)
 ED Pharmacy Antibiotic Sign Off An antibiotic consult was received from an ED provider for cellulitis per pharmacy dosing for vancomycin. A chart review was completed to assess appropriateness.   The following one time order(s) were placed:  Vancomycin 1000 mg IV x1  Further antibiotic and/or antibiotic pharmacy consults should be ordered by the admitting provider if indicated.   Thank you for allowing pharmacy to be a part of this patient's care.   Marget Hench, [facility]  Clinical Pharmacist 12/03/23 8:18 AM

## 2023-12-03 NOTE — H&P (Signed)
 History and Physical    Patient: Melissa Cervantes FMW:[id number] DOB: 1940-02-25 DOA: 12/03/2023 DOS: the patient was seen and examined on 12/03/2023 PCP: Merna Huxley, NP  Patient coming from: Home  Chief Complaint:  Chief Complaint  Patient presents with   Wound Check   HPI: Melissa Cervantes is a 83 y.o. 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  She was put on cephalexin p.o. without significant results.  The wound has affected her sleep.  She denied fever, chills, rhinorrhea, sore throat, wheezing or hemoptysis.  No chest pain, palpitations, diaphoresis, PND, orthopnea or pitting edema of the lower extremities.  No abdominal pain, nausea, emesis, diarrhea, constipation, melena or hematochezia.  No flank pain, dysuria, frequency or hematuria.  No polyuria, polydipsia, polyphagia or blurred vision.   Lab work: CBC showed white count 9.5, hemoglobin 12.1 g/dL platelets 655.  Normal lactic acid, BMP and negative coronavirus/influenza/PCR test.  Imaging: Left tibia-fibula x-ray done 3 days ago did not show any acute fracture or dislocation, but showed diffuse soft tissue edema.   ED course: Initial vital signs were temperature 97.8 F, pulse 74, respirations 16, BP 151/76 mmHg and O2 sat 100% on room air.  The patient received vancomycin 1000 mg IVPB x 1.  Review of Systems: As mentioned in the history of present illness. All other systems reviewed and are negative. Past Medical History:  Diagnosis Date   Achalasia    ADHD    Allergy    Anxiety    Cervical dystonia    Chicken pox     Esophageal obstruction    per patient she is unable to eat solid food   Hearing loss    Hypertension    Meniere disease    Neuromuscular disorder (HCC)    toricollis  gets Botox injections q 3-34 months   Osteoarthritis    Spinal stenosis    Past Surgical History:  Procedure Laterality Date   BALLOON DILATION N/A 01/30/2021   Procedure: BALLOON DILATION;  Surgeon: Shila Gustav GAILS, MD;  Location: [facility];  Service: Endoscopy;  Laterality: N/A;   BRAIN SURGERY  07/02/2023   Right frontal VP shunt   CESAREAN SECTION  1971, 1979   CYST EXCISION     Pilonidal cyst removed   ESOPHAGEAL BRUSHING  01/30/2021   Procedure: ESOPHAGEAL BRUSHING;  Surgeon: Shila Gustav GAILS, MD;  Location: [facility];  Service: Endoscopy;;   ESOPHAGOGASTRODUODENOSCOPY (EGD) WITH PROPOFOL  N/A 01/30/2021   Procedure: ESOPHAGOGASTRODUODENOSCOPY (EGD) WITH PROPOFOL ;  Surgeon: Shila Gustav GAILS, MD;  Location: [facility];  Service: Endoscopy;  Laterality: N/A;   EYE SURGERY Bilateral    cataract removal   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   NISSEN FUNDOPLICATION  2007   TOTAL HIP ARTHROPLASTY Left 06/2012   TOTAL HIP ARTHROPLASTY Right 09/09/2023   Procedure: ARTHROPLASTY, HIP, TOTAL, ANTERIOR APPROACH;  Surgeon: Ernie Cough, MD;  Location: [facility];  Service: Orthopedics;  Laterality: Right;   Social History:  reports that she has quit smoking. She has never used smokeless tobacco. She reports current alcohol use. She reports that she does not currently use drugs after having used  the following drugs: Marijuana.  Allergies  Allergen Reactions   Lipitor [Atorvastatin ] Other (See Comments)    Fatigue and muscle aches    Latex Dermatitis   Other     Seasonal allergies, horses, cats and mold    Family History  Problem Relation Age of Onset   Heart disease Mother    Heart disease Father    Alcohol abuse Father    Dystonia Sister    Dystonia Brother     Colon cancer Neg Hx    Esophageal cancer Neg Hx    Stomach cancer Neg Hx     Prior to Admission medications   Medication Sig Start Date End Date Taking? Authorizing Provider  Acetaminophen  (PAIN RELIEF) 167 MG/5ML LIQD Take 500 mg by mouth in the morning, at noon, and at bedtime.    [provider]  albuterol  (VENTOLIN  HFA) 108 (90 Base) MCG/ACT inhaler INHALE 1-2 PUFFS BY MOUTH EVERY 6 HOURS AS NEEDED FOR WHEEZE OR SHORTNESS OF BREATH 10/02/21   Nafziger, Darleene, NP  amphetamine -dextroamphetamine  (ADDERALL) 15 MG tablet Take 1 tablet by mouth 2 (two) times daily. 10/01/23 10/31/23  Nafziger, Darleene, NP  amphetamine -dextroamphetamine  (ADDERALL) 15 MG tablet Take 1 tablet by mouth 2 (two) times daily. 10/01/23 11/30/23  Nafziger, Darleene, NP  amphetamine -dextroamphetamine  (ADDERALL) 15 MG tablet Take 1 tablet by mouth 2 (two) times daily. 10/01/23 10/31/23  Nafziger, Cory, NP  cephALEXin (KEFLEX) 500 MG capsule Take 1 capsule (500 mg total) by mouth 2 (two) times daily for 7 days. 11/30/23 12/07/23  Anders Otto DASEN, MD  clonazePAM  (KLONOPIN ) 0.5 MG tablet Take 1 tablet (0.5 mg total) by mouth 2 (two) times a day as needed (dystonic spasms). Patient taking differently: Take 0.5 mg by mouth at bedtime. 06/06/23     fluticasone  (FLONASE ) 50 MCG/ACT nasal spray Place 1 spray into both nostrils daily as needed for allergies. 09/19/21   [provider]  Fluticasone  Furoate (ARNUITY ELLIPTA ) 100 MCG/ACT AEPB INHALE 1 PUFF BY MOUTH EVERY DAY Patient taking differently: Inhale 1 puff into the lungs daily as needed (respiratory issues.). 05/08/23   Desai, Nikita S, MD  lisinopril  (ZESTRIL ) 2.5 MG tablet Take 1 tablet (2.5 mg total) by mouth every evening. 09/24/23   Nafziger, Darleene, NP  methocarbamol  (ROBAXIN ) 500 MG tablet Take 1 tablet (500 mg total) by mouth every 6 (six) hours as needed for muscle spasms. 09/10/23   Patti Rosina SAUNDERS, PA-C  Multiple Vitamin (MULTIVITAMIN WITH MINERALS) TABS tablet Take  1 tablet by mouth in the morning. Vita Fusion Multivitamin    [provider]  oxyCODONE  (OXY IR/ROXICODONE ) 5 MG immediate release tablet Take 0.5-1 tablets (2.5-5 mg total) by mouth every 4 (four) hours as needed for severe pain (pain score 7-10). 09/10/23   Patti Rosina SAUNDERS, PA-C  simvastatin  (ZOCOR ) 10 MG tablet Take 1 tablet (10 mg total) by mouth at bedtime. 09/24/23   Nafziger, Darleene, NP  tiZANidine  (ZANAFLEX ) 2 MG tablet Take 1-2 tablets (2-4 mg total) by mouth every evening for sleep/muscle pain. 09/15/23       Physical Exam: Vitals:   12/03/23 0732 12/03/23 1041  BP: (!) 151/76 (!) 160/74  Pulse: 74 75  Resp: 16 16  Temp: 97.8 F (36.6 C) 98.1 F (36.7 C)  TempSrc: Axillary   SpO2: 100% 100%   Physical Exam Vitals and nursing note reviewed.  Constitutional:      General: She is awake. She is not in acute distress.    Appearance: Normal appearance.  She is ill-appearing.  HENT:     Head: Normocephalic.     Nose: No rhinorrhea.     Mouth/Throat:     Mouth: Mucous membranes are moist.  Eyes:     General: No scleral icterus.    Pupils: Pupils are equal, round, and reactive to light.  Neck:     Vascular: No JVD.  Cardiovascular:     Rate and Rhythm: Normal rate and regular rhythm.     Heart sounds: S1 normal and S2 normal.  Pulmonary:     Effort: Pulmonary effort is normal.     Breath sounds: Normal breath sounds. No wheezing, rhonchi or rales.  Abdominal:     General: Bowel sounds are normal. There is no distension.     Palpations: Abdomen is soft.     Tenderness: There is no abdominal tenderness. There is no right CVA tenderness or left CVA tenderness.  Musculoskeletal:     Cervical back: Neck supple.     Right lower leg: No edema.     Left lower leg: Tenderness present. Edema present.  Skin:    General: Skin is warm and dry.     Coloration: Skin is not jaundiced.     Findings: Erythema and lesion present.     Comments: Left pretibial area wound with  hemopurulent discharge, calor, TTP, surrounding erythema and edema.  Please see picture below.  Neurological:     General: No focal deficit present.     Mental Status: She is alert and oriented to person, place, and time.  Psychiatric:        Mood and Affect: Mood normal.        Behavior: Behavior normal. Behavior is cooperative.     Data Reviewed:  Results are pending, will review when available.  Assessment and Plan: Principal Problem:   Cellulitis of left lower extremity Observation/MedSurg. Continue local care. Analgesics as needed. Antiemetics as needed. Begin ceftriaxone 1 g IVP daily. Continue vancomycin per pharmacy. Follow CBC, CMP in AM.  Active Problems:   Cervical dystonia In the setting of:   Spinal stenosis Analgesics as needed.   Muscle relaxants as needed.    ADHD Continue Adderall 15 mg p.o. twice daily.    Hyperlipidemia Continue simvastatin  10 mg p.o. daily.    Hypertension Continue lisinopril  2.5 mg p.o. daily.     Advance Care Planning:   Code Status: Full Code   Consults:   Family Communication: Her niece was at bedside.  Severity of Illness: The appropriate patient status for this patient is INPATIENT. Inpatient status is judged to be reasonable and necessary in order to provide the required intensity of service to ensure the patient's safety. The patient's presenting symptoms, physical exam findings, and initial radiographic and laboratory data in the context of their chronic comorbidities is felt to place them at high risk for further clinical deterioration. Furthermore, it is not anticipated that the patient will be medically stable for discharge from the hospital within 2 midnights of admission.   * I certify that at the point of admission it is my clinical judgment that the patient will require inpatient hospital care spanning beyond 2 midnights from the point of admission due to high intensity of service, high risk for further  deterioration and high frequency of surveillance required.*  Author: Alm Dorn Castor, MD 12/03/2023 10:45 AM  For on call review [email or web address].   This document was prepared using Dragon voice recognition software and may contain some unintended transcription errors.

## 2023-12-04 ENCOUNTER — Inpatient Hospital Stay (HOSPITAL_COMMUNITY)

## 2023-12-04 DIAGNOSIS — L03116 Cellulitis of left lower limb: Secondary | ICD-10-CM | POA: Diagnosis not present

## 2023-12-04 LAB — CBC
HCT: 34.3 % — ABNORMAL LOW (ref 36.0–46.0)
Hemoglobin: 11 g/dL — ABNORMAL LOW (ref 12.0–15.0)
MCH: 30.8 pg (ref 26.0–34.0)
MCHC: 32.1 g/dL (ref 30.0–36.0)
MCV: 96.1 fL (ref 80.0–100.0)
Platelets: 316 K/uL (ref 150–400)
RBC: 3.57 MIL/uL — ABNORMAL LOW (ref 3.87–5.11)
RDW: 12.9 % (ref 11.5–15.5)
WBC: 6.4 K/uL (ref 4.0–10.5)
nRBC: 0 % (ref 0.0–0.2)

## 2023-12-04 LAB — COMPREHENSIVE METABOLIC PANEL WITH GFR
ALT: 6 U/L (ref 0–44)
AST: 16 U/L (ref 15–41)
Albumin: 3.6 g/dL (ref 3.5–5.0)
Alkaline Phosphatase: 109 U/L (ref 38–126)
Anion gap: 9 (ref 5–15)
BUN: 14 mg/dL (ref 8–23)
CO2: 24 mmol/L (ref 22–32)
Calcium: 9.5 mg/dL (ref 8.9–10.3)
Chloride: 108 mmol/L (ref 98–111)
Creatinine, Ser: 0.63 mg/dL (ref 0.44–1.00)
GFR, Estimated: >60 mL/min (ref >60)
Glucose, Bld: 92 mg/dL (ref 70–99)
Potassium: 3.9 mmol/L (ref 3.5–5.1)
Sodium: 142 mmol/L (ref 135–145)
Total Bilirubin: 0.5 mg/dL (ref 0.0–1.2)
Total Protein: 5.7 g/dL — ABNORMAL LOW (ref 6.5–8.1)

## 2023-12-04 MED ORDER — LISINOPRIL 5 MG PO TABS
2.5000 mg | ORAL_TABLET | Freq: Every day | ORAL | Status: DC
  Administered 2023-12-04 – 2023-12-06 (×3): 2.5 mg via ORAL
  Filled 2023-12-04 (×3): qty 1

## 2023-12-04 MED ORDER — DIPHENHYDRAMINE-ZINC ACETATE 2-0.1 % EX CREA
TOPICAL_CREAM | Freq: Two times a day (BID) | CUTANEOUS | Status: DC | PRN
  Filled 2023-12-04: qty 28

## 2023-12-04 MED ORDER — IOHEXOL 300 MG/ML  SOLN
100.0000 mL | Freq: Once | INTRAMUSCULAR | Status: AC | PRN
  Administered 2023-12-04: 100 mL via INTRAVENOUS

## 2023-12-04 MED ORDER — COLLAGENASE 250 UNIT/GM EX OINT
TOPICAL_OINTMENT | Freq: Every day | CUTANEOUS | Status: DC
  Filled 2023-12-04: qty 30

## 2023-12-04 NOTE — Progress Notes (Signed)
 SPIRITUAL CARE AND COUNSELING CONSULT NOTE   VISIT SUMMARY:  Miyo is reflecting on what it means to be 83 and closer to the end of her life.  She has been exploring what this means to her as well as what is important to her now.  I provided listening as well as emotional and spiritual support.   SPIRITUAL ENCOUNTER                                                                                                                                                                      Type of Visit: Initial Care provided to:: Patient Referral source: Nurse (RN/NT/LPN) Reason for visit: Routine spiritual support OnCall Visit: No   SPIRITUAL FRAMEWORK  Presenting Themes: Meaning/purpose/sources of inspiration, Values and beliefs, Community and relationships Values/beliefs: She is beginning to think about what it means to be closer to the end of her life at the age of 66. Community/Connection: Family Needs/Challenges/Barriers: Limited local support. Patient Stress Factors: Loss of control     INTERVENTIONS   Spiritual Care Interventions Made: Established relationship of care and support, Reflective listening, Compassionate presence, Normalization of emotions, Explored values/beliefs/practices/strengths, Encouragement    INTERVENTION OUTCOMES   Outcomes: Connection to spiritual care, Awareness around self/spiritual resourses, Connection to values and goals of care  SPIRITUAL CARE PLAN   Spiritual Care Issues Still Outstanding: No further spiritual care needs at this time (see row info)    If immediate needs arise, please contact [facility]al Health 24 hour on call [phone]   Evins Lamarr Caldron, Chaplain  12/04/2023 5:09 PM

## 2023-12-04 NOTE — TOC Initial Note (Addendum)
 Transition of Care [facility]) - Initial/Assessment Note    Patient Details  Name: Melissa Cervantes MRN: [id number] Date of Birth: [birth year]  Transition of Care [facility]) CM/SW Contact:    Doneta Glenys DASEN, RN Phone Number: 12/04/2023, 11:49 AM  Clinical Narrative:                 Presented for left lower leg cellulitis. PTA lives in a town house alone; DME-cane, walker; denies HH, oxygen and SDOH needs; Niece will transport at discharge. Patient is agreeable to recommendation for outpatient wound care appointment. Inpatient care management following. 4:32 PM CM received a call from Charlene Muzzy (niece) to inform me that she it the patients primary transportation and support. Charlene will be out of town 11/5 - 11/11 and requesting [facility] RN for wound care if patient is discharge during this time.  Expected Discharge Plan: Home/Self Care Barriers to Discharge: Continued Medical Work up   Patient Goals and CMS Choice Patient states their goals for this hospitalization and ongoing recovery are:: Home alone CMS Medicare.gov Compare Post Acute Care list provided to::  (NA) Choice offered to / list presented to : NA [place] ownership interest in [facility].provided to:: Parent NA    Expected Discharge Plan and Services In-house Referral: NA Discharge Planning Services: Follow-up appt scheduled, CM Consult Post Acute Care Choice: NA Living arrangements for the past 2 months: Single Family Home                 DME Arranged: N/A DME Agency: NA       HH Arranged: NA HH Agency: NA        Prior Living Arrangements/Services Living arrangements for the past 2 months: Single Family Home Lives with:: Self Patient language and need for interpreter reviewed:: Yes Do you feel safe going back to the place where you live?: Yes      Need for Family Participation in Patient Care: Yes (Comment) Care giver support system in place?: Yes (comment) Current home services: DME (cane,  walker) Criminal Activity/Legal Involvement Pertinent to Current Situation/Hospitalization: No - Comment as needed  Activities of Daily Living   ADL Screening (condition at time of admission) Independently performs ADLs?: Yes (appropriate for developmental age) Is the patient deaf or have difficulty hearing?: Yes Does the patient have difficulty seeing, even when wearing glasses/contacts?: Yes Does the patient have difficulty concentrating, remembering, or making decisions?: No  Permission Sought/Granted Permission sought to share information with : Case Manager, Magazine Features Editor Permission granted to share information with : Yes, Verbal Permission Granted  Share Information with NAME: Charletta Railing (niece) [phone]  Permission granted to share info w AGENCY: 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   Dysphagia    Spinal stenosis 06/09/2020   Meniere disease    ADHD    Achalasia    Osteoarthritis    Arthritis of right hip 05/24/2020  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   Degenerative arthritis of right knee 04/21/2020   PCP:  Merna Huxley, NP Pharmacy:  No Pharmacies Listed    Social Drivers of Health (SDOH) Social History: SDOH Screenings    Food Insecurity: No Food Insecurity (12/03/2023)  Housing: Low Risk  (12/03/2023)  Transportation Needs: No Transportation Needs (12/03/2023)  Utilities: At Risk (12/03/2023)  Alcohol Screen: Low Risk  (05/24/2022)  Depression (PHQ2-9): Low Risk  (05/24/2022)  Financial Resource Strain: Low Risk  (05/24/2022)  Physical Activity: Sufficiently Active (05/24/2022)  Social Connections: Socially Isolated (12/03/2023)  Stress: No Stress Concern Present (05/24/2022)  Tobacco Use: Medium Risk (12/03/2023)   SDOH Interventions:     Readmission Risk Interventions    12/04/2023   11:44 AM  Readmission Risk Prevention Plan  Post Dischage Appt Complete  Medication Screening Complete  Transportation Screening Complete

## 2023-12-04 NOTE — Consult Note (Addendum)
 WOC Nurse Consult Note: Reason for Consult: Consult requested for left leg wound.  Performed remotely after review of progress notes and photo in the EMR. Pt has cellulitis surrounding the wound with generalized edema and erythremia and is on systemic antibiotics.  Left anterior leg with full thickness wound with tightly adhered eschar. X-ray does not indicate an abscess.      Topical treatment orders provided for bedside nurses to perform as follows to assist with removal of nonviable tissue: Apply Santyl to left leg wound Q day, then cover with moist gauze dressing and foam dressing.  Change outer foam dressing Q 3 days or PRN soiling.  Secure chat message sent to the primary team as follows: Pt could benefit from follow-up at the outpatient wound care center after discharge so they can perform serial debridements as the eschar begins to loosen eventually; please order if desired.  This must be by physician referral and the case manager can assist with the arrangements if you agree.   Please re-consult if further assistance is needed.  Thank-you,  Stephane Fought MSN, RN, CWOCN, CWCN-AP, CNS Contact Mon-Fri 0700-1500: [phone]

## 2023-12-04 NOTE — Progress Notes (Signed)
 PROGRESS NOTE  Melissa Cervantes  FMW:[id number] DOB: 1940-10-05 DOA: 12/03/2023 PCP: Merna Huxley, NP   Brief Narrative: Patient is a 83 year old female with history of achalasia, esophageal obstruction, ADHD, anxiety, Mnire's disease, hearing loss, hyperlipidemia, hypertension who presented with complaint of edema, redness of the left lower extremity.  Reported sustaining a wound about 2 weeks ago when a large item fell to her left lower extremity.  She was taking Keflex which did not help..  No history of fever or chills.  On presentation, she was hemodynamically stable.  Lab work showed WC count of 9.5.  Left/fibula done about 3 days ago did not show any acute fracture or dislocation but soft tissue edema.  Patient admitted for the management of left lower extremity cellulitis.  Started on broad-spectrum antibiotics.  Assessment & Plan:  Principal Problem:   Cellulitis of left lower extremity Active Problems:   Cervical dystonia   ADHD   Spinal stenosis   Hyperlipidemia   Hypertension  Cellulitis of the left lower extremity:presented with complaint of edema, redness of the left lower extremity.  Reported sustaining a wound about 2 weeks ago when a large item fell to her left lower extremity.  She was taking Keflex was did not help.Left/fibula done about 3 days ago did not show any acute fracture or dislocation but soft tissue edema..  Started on vancomycin, ceftriaxone, follow-up cultures.  Will check CT of the left lower extremity to assess if there is any underlying abscess.  Wound care nurse following.  Recommended outpatient wound care referral.  History of ADHD: On Adderall  History of hyperlipidemia: Statin  History of hypertension: On lisinopril  at home         DVT prophylaxis:enoxaparin (LOVENOX) injection 40 mg Start: 12/03/23 2200     Code Status: Full Code  Family Communication: None at bedside  Patient status:Inpatient  Patient is from :Home  Anticipated  discharge un:[facility]  Estimated DC date:2-3 days   Consultants: None  Procedures:None  Antimicrobials:  Anti-infectives (From admission, onward)    Start     Dose/Rate Route Frequency Ordered Stop   12/04/23 0900  vancomycin (VANCOREADY) IVPB 750 mg/150 mL        750 mg 150 mL/hr over 60 Minutes Intravenous Every 24 hours 12/03/23 1125 12/10/23 0859   12/03/23 1115  cefTRIAXone (ROCEPHIN) 1 g in sodium chloride  0.9 % 100 mL IVPB        1 g 200 mL/hr over 30 Minutes Intravenous Daily 12/03/23 1105     12/03/23 0830  vancomycin (VANCOCIN) IVPB 1000 mg/200 mL premix        1,000 mg 200 mL/hr over 60 Minutes Intravenous  Once 12/03/23 0817 12/03/23 1024       Subjective: Patient seen and examined at bedside today.  Hemodynamically stable.  Sitting at the edge of the bed.  Left lower extremity wound examined.  Has black eschar in the middle with surrounding erythema.  No clear fluctuance.  No significant edema.  We discussed about doing a CT scan to assess for any underlying abscess  Objective: Vitals:   12/03/23 1331 12/03/23 2112 12/03/23 2233 12/04/23 0104  BP:  (!) 147/65 (!) 142/78 128/63  Pulse:  72  69  Resp:  14  18  Temp:  98.4 F (36.9 C)  98 F (36.7 C)  TempSrc:  Oral  Oral  SpO2:  98%  97%  Weight: 52.5 kg     Height: 5' 1 (1.549 m)  Intake/Output Summary (Last 24 hours) at 12/04/2023 9187 Last data filed at 12/03/2023 1839 Gross per 24 hour  Intake 300.89 ml  Output --  Net 300.89 ml   Filed Weights   12/03/23 1331  Weight: 52.5 kg    Examination:  General exam: Overall comfortable, not in distress HEENT: PERRL Respiratory system:  no wheezes or crackles  Cardiovascular system: S1 & S2 heard, RRR.  Gastrointestinal system: Abdomen is nondistended, soft and nontender. Central nervous system: Alert and oriented Extremities: Central black eschar with surrounding erythema on the distal left leg Skin: No rashes, no icterus      Data  Reviewed: I have personally reviewed following labs and imaging studies  CBC: Recent Labs  Lab 12/03/23 0822 12/04/23 0459  WBC 9.5 6.4  NEUTROABS 5.7  --   HGB 12.1 11.0*  HCT 38.5 34.3*  MCV 97.7 96.1  PLT 344 316   Basic Metabolic Panel: Recent Labs  Lab 12/03/23 0822 12/04/23 0459  NA 140 142  K 3.9 3.9  CL 104 108  CO2 24 24  GLUCOSE 96 92  BUN 16 14  CREATININE 0.74 0.63  CALCIUM  10.0 9.5     Recent Results (from the past 240 hours)  Resp panel by RT-PCR (RSV, Flu A&B, Covid) Peripheral     Status: None   Collection Time: 12/03/23  8:47 AM   Specimen: Peripheral; Nasal Swab  Result Value Ref Range Status   SARS Coronavirus 2 by RT PCR NEGATIVE NEGATIVE Final    Comment: (NOTE) SARS-CoV-2 target nucleic acids are NOT DETECTED.  The SARS-CoV-2 RNA is generally detectable in upper respiratory specimens during the acute phase of infection. The lowest concentration of SARS-CoV-2 viral copies this assay can detect is 138 copies/mL. A negative result does not preclude SARS-Cov-2 infection and should not be used as the sole basis for treatment or other patient management decisions. A negative result may occur with  improper specimen collection/handling, submission of specimen other than nasopharyngeal swab, presence of viral mutation(s) within the areas targeted by this assay, and inadequate number of viral copies(<138 copies/mL). A negative result must be combined with clinical observations, patient history, and epidemiological information. The expected result is Negative.  Fact Sheet for Patients:  bloggercourse.com  Fact Sheet for Healthcare Providers:  seriousbroker.it  This test is no t yet approved or cleared by the [place]  FDA and  has been authorized for detection and/or diagnosis of SARS-CoV-2 by FDA under an Emergency Use Authorization (EUA). This EUA will remain  in effect (meaning this test  can be used) for the duration of the COVID-19 declaration under Section 564(b)(1) of the Act, 21 U.S.C.section 360bbb-3(b)(1), unless the authorization is terminated  or revoked sooner.       Influenza A by PCR NEGATIVE NEGATIVE Final   Influenza B by PCR NEGATIVE NEGATIVE Final    Comment: (NOTE) The Xpert Xpress SARS-CoV-2/FLU/RSV plus assay is intended as an aid in the diagnosis of influenza from Nasopharyngeal swab specimens and should not be used as a sole basis for treatment. Nasal washings and aspirates are unacceptable for Xpert Xpress SARS-CoV-2/FLU/RSV testing.  Fact Sheet for Patients: bloggercourse.com  Fact Sheet for Healthcare Providers: seriousbroker.it  This test is not yet approved or cleared by the [place]  FDA and has been authorized for detection and/or diagnosis of SARS-CoV-2 by FDA under an Emergency Use Authorization (EUA). This EUA will remain in effect (meaning this test can be used) for the duration of the COVID-19 declaration  under Section 564(b)(1) of the Act, 21 U.S.C. section 360bbb-3(b)(1), unless the authorization is terminated or revoked.     Resp Syncytial Virus by PCR NEGATIVE NEGATIVE Final    Comment: (NOTE) Fact Sheet for Patients: bloggercourse.com  Fact Sheet for Healthcare Providers: seriousbroker.it  This test is not yet approved or cleared by the [place]  FDA and has been authorized for detection and/or diagnosis of SARS-CoV-2 by FDA under an Emergency Use Authorization (EUA). This EUA will remain in effect (meaning this test can be used) for the duration of the COVID-19 declaration under Section 564(b)(1) of the Act, 21 U.S.C. section 360bbb-3(b)(1), unless the authorization is terminated or revoked.  Performed at [facility], [address], [place], [place] [address]      Radiology Studies: No  results found.  Scheduled Meds:  enoxaparin (LOVENOX) injection  40 mg Subcutaneous Q24H   Influenza vac split trivalent PF  0.5 mL Intramuscular Tomorrow-1000   lisinopril   2.5 mg Oral QPM   simvastatin   10 mg Oral QHS   Continuous Infusions:  cefTRIAXone (ROCEPHIN)  IV Stopped (12/03/23 1230)   vancomycin       LOS: 1 day   Ivonne Mustache, MD [facility] P10/30/2025, 8:12 AM

## 2023-12-04 NOTE — Progress Notes (Signed)
 Wound changed per wound care recommendations.

## 2023-12-04 NOTE — Plan of Care (Signed)
  Problem: Clinical Measurements: Goal: Respiratory complications will improve Outcome: Progressing Goal: Cardiovascular complication will be avoided Outcome: Progressing   Problem: Nutrition: Goal: Adequate nutrition will be maintained Outcome: Progressing   Problem: Coping: Goal: Level of anxiety will decrease Outcome: Progressing   Problem: Pain Managment: Goal: General experience of comfort will improve and/or be controlled Outcome: Progressing   Problem: Safety: Goal: Ability to remain free from injury will improve Outcome: Progressing

## 2023-12-04 NOTE — Plan of Care (Signed)

## 2023-12-05 DIAGNOSIS — L03116 Cellulitis of left lower limb: Secondary | ICD-10-CM | POA: Diagnosis not present

## 2023-12-05 MED ORDER — CLONAZEPAM 0.5 MG PO TABS
0.5000 mg | ORAL_TABLET | Freq: Every day | ORAL | Status: DC
  Administered 2023-12-05 – 2023-12-06 (×2): 0.5 mg via ORAL
  Filled 2023-12-05 (×2): qty 1

## 2023-12-05 MED ORDER — AMPHETAMINE-DEXTROAMPHETAMINE 10 MG PO TABS
10.0000 mg | ORAL_TABLET | Freq: Two times a day (BID) | ORAL | Status: DC
  Administered 2023-12-06 – 2023-12-07 (×3): 10 mg via ORAL
  Filled 2023-12-05 (×3): qty 1

## 2023-12-05 MED ORDER — AMPHETAMINE-DEXTROAMPHETAMINE 10 MG PO TABS
15.0000 mg | ORAL_TABLET | Freq: Two times a day (BID) | ORAL | Status: DC
  Filled 2023-12-05: qty 2

## 2023-12-05 NOTE — Plan of Care (Signed)
  Problem: Clinical Measurements: Goal: Respiratory complications will improve Outcome: Progressing Goal: Cardiovascular complication will be avoided Outcome: Progressing   Problem: Activity: Goal: Risk for activity intolerance will decrease Outcome: Progressing   Problem: Coping: Goal: Level of anxiety will decrease Outcome: Progressing   Problem: Pain Managment: Goal: General experience of comfort will improve and/or be controlled Outcome: Progressing   Problem: Safety: Goal: Ability to remain free from injury will improve Outcome: Progressing   Problem: Skin Integrity: Goal: Risk for impaired skin integrity will decrease Outcome: Progressing   Problem: Clinical Measurements: Goal: Ability to avoid or minimize complications of infection will improve Outcome: Progressing

## 2023-12-05 NOTE — Progress Notes (Signed)
 PROGRESS NOTE  Melissa Cervantes  FMW:[id number] DOB: 1940/07/01 DOA: 12/03/2023 PCP: Merna Huxley, NP   Brief Narrative: Patient is a 83 year old female with history of achalasia, esophageal obstruction, ADHD, anxiety, Mnire's disease, hearing loss, hyperlipidemia, hypertension who presented with complaint of edema, redness of the left lower extremity.  Reported sustaining a wound about 2 weeks ago when a large item fell to her left lower extremity.  She was taking Keflex which did not help..  No history of fever or chills.  On presentation, she was hemodynamically stable.  Lab work showed WC count of 9.5.  Left/fibula done about 3 days ago did not show any acute fracture or dislocation but soft tissue edema.  Patient admitted for the management of left lower extremity cellulitis.  Started on broad-spectrum antibiotics.CT imaging of the left lower extremity on 10/30 showed underlying collection, cannot rule out abscess.  Orthopedics consulted today.  Assessment & Plan:  Principal Problem:   Cellulitis of left lower extremity Active Problems:   Cervical dystonia   ADHD   Spinal stenosis   Hyperlipidemia   Hypertension  Cellulitis of the left lower extremity:presented with complaint of edema, redness of the left lower extremity.  Reported sustaining a wound about 2 weeks ago when a large item fell to her left lower extremity.  She was taking Keflex was did not help.Left/fibula done about 3 days ago did not show any acute fracture or dislocation but soft tissue edema..  Started on vancomycin, ceftriaxone, follow-up cultures.NGTD. CT of left lower extremity done on 10/30 showed mildly complex subcutaneous fluid collection along the anterolateral distal calf measuring 8.3 x 3.7 x 1.2 cm likely hematoma, less likely abscess.  We have consulted orthopedics today .Dr Reyne will see her today  History of ADHD: On Adderall  History of hyperlipidemia: Statin  History of hypertension: On  lisinopril  at home  Debility/Deconditioning: Patient lives alone.  Son is concerned about being alone and lacking appropriate care.  Will consult PT when appropriate         DVT prophylaxis:enoxaparin (LOVENOX) injection 40 mg Start: 12/03/23 2200     Code Status: Full Code  Family Communication: Called and discussed with son Worth on phone on 10/31  Patient status:Inpatient  Patient is from :Home  Anticipated discharge un:Ynfz  Estimated DC date:2-3 days   Consultants: Orthopedics  Procedures:None  Antimicrobials:  Anti-infectives (From admission, onward)    Start     Dose/Rate Route Frequency Ordered Stop   12/04/23 0900  vancomycin (VANCOREADY) IVPB 750 mg/150 mL        750 mg 150 mL/hr over 60 Minutes Intravenous Every 24 hours 12/03/23 1125 12/10/23 0859   12/03/23 1115  cefTRIAXone (ROCEPHIN) 1 g in sodium chloride  0.9 % 100 mL IVPB        1 g 200 mL/hr over 30 Minutes Intravenous Daily 12/03/23 1105     12/03/23 0830  vancomycin (VANCOCIN) IVPB 1000 mg/200 mL premix        1,000 mg 200 mL/hr over 60 Minutes Intravenous  Once 12/03/23 0817 12/03/23 1024       Subjective: Patient seen and examined at bedside today.  Hemodynamically stable.  Afebrile.  Denies any worsening pain on the left lower extremity.  Left lower extremity wound looks the same.  We discussed about CT finding and plan for consultation by orthopedics  Objective: Vitals:   12/04/23 2027 12/04/23 2237 12/05/23 0014 12/05/23 0456  BP: (!) 160/71 (!) 155/82 113/62 128/63  Pulse: 82  81 63  Resp: 18  18 18   Temp: 98.3 F (36.8 C)  97.7 F (36.5 C) 97.9 F (36.6 C)  TempSrc:      SpO2: 100%  96% 97%  Weight:      Height:        Intake/Output Summary (Last 24 hours) at 12/05/2023 1117 Last data filed at 12/05/2023 0000 Gross per 24 hour  Intake 490 ml  Output --  Net 490 ml   Filed Weights   12/03/23 1331  Weight: 52.5 kg    Examination:  General exam: Overall  comfortable, not in distress,hard of hearing HEENT: PERRL Respiratory system:  no wheezes or crackles  Cardiovascular system: S1 & S2 heard, RRR.  Gastrointestinal system: Abdomen is nondistended, soft and nontender. Central nervous system: Alert and oriented Extremities: Central black eschar with surrounding erythema on the distal left leg, surrounding fluctuance Skin: No rashes, no icterus      Data Reviewed: I have personally reviewed following labs and imaging studies  CBC: Recent Labs  Lab 12/03/23 0822 12/04/23 0459  WBC 9.5 6.4  NEUTROABS 5.7  --   HGB 12.1 11.0*  HCT 38.5 34.3*  MCV 97.7 96.1  PLT 344 316   Basic Metabolic Panel: Recent Labs  Lab 12/03/23 0822 12/04/23 0459  NA 140 142  K 3.9 3.9  CL 104 108  CO2 24 24  GLUCOSE 96 92  BUN 16 14  CREATININE 0.74 0.63  CALCIUM  10.0 9.5     Recent Results (from the past 240 hours)  Blood culture (routine x 2)     Status: None (Preliminary result)   Collection Time: 12/03/23  8:22 AM   Specimen: BLOOD  Result Value Ref Range Status   Specimen Description   Final    BLOOD SITE NOT SPECIFIED Performed at [facility], [address], [place], [place] [address]    Special Requests   Final    BOTTLES DRAWN AEROBIC AND ANAEROBIC Blood Culture adequate volume Performed at [facility], [address] [place], [place] [address]    Culture   Final    NO GROWTH 2 DAYS Performed at [facility], [address], [place], [place] [address]    Report Status PENDING  Incomplete  Resp panel by RT-PCR (RSV, Flu A&B, Covid) Peripheral     Status: None   Collection Time: 12/03/23  8:47 AM   Specimen: Peripheral; Nasal Swab  Result Value Ref Range Status   SARS Coronavirus 2 by RT PCR NEGATIVE NEGATIVE Final    Comment: (NOTE) SARS-CoV-2 target nucleic acids are NOT DETECTED.  The SARS-CoV-2 RNA is generally detectable in upper respiratory specimens during the acute  phase of infection. The lowest concentration of SARS-CoV-2 viral copies this assay can detect is 138 copies/mL. A negative result does not preclude SARS-Cov-2 infection and should not be used as the sole basis for treatment or other patient management decisions. A negative result may occur with  improper specimen collection/handling, submission of specimen other than nasopharyngeal swab, presence of viral mutation(s) within the areas targeted by this assay, and inadequate number of viral copies(<138 copies/mL). A negative result must be combined with clinical observations, patient history, and epidemiological information. The expected result is Negative.  Fact Sheet for Patients:  bloggercourse.com  Fact Sheet for Healthcare Providers:  seriousbroker.it  This test is no t yet approved or cleared by the [place]  FDA and  has been authorized for detection and/or diagnosis  of SARS-CoV-2 by FDA under an Emergency Use Authorization (EUA). This EUA will remain  in effect (meaning this test can be used) for the duration of the COVID-19 declaration under Section 564(b)(1) of the Act, 21 U.S.C.section 360bbb-3(b)(1), unless the authorization is terminated  or revoked sooner.       Influenza A by PCR NEGATIVE NEGATIVE Final   Influenza B by PCR NEGATIVE NEGATIVE Final    Comment: (NOTE) The Xpert Xpress SARS-CoV-2/FLU/RSV plus assay is intended as an aid in the diagnosis of influenza from Nasopharyngeal swab specimens and should not be used as a sole basis for treatment. Nasal washings and aspirates are unacceptable for Xpert Xpress SARS-CoV-2/FLU/RSV testing.  Fact Sheet for Patients: bloggercourse.com  Fact Sheet for Healthcare Providers: seriousbroker.it  This test is not yet approved or cleared by the [place]  FDA and has been authorized for detection and/or diagnosis of  SARS-CoV-2 by FDA under an Emergency Use Authorization (EUA). This EUA will remain in effect (meaning this test can be used) for the duration of the COVID-19 declaration under Section 564(b)(1) of the Act, 21 U.S.C. section 360bbb-3(b)(1), unless the authorization is terminated or revoked.     Resp Syncytial Virus by PCR NEGATIVE NEGATIVE Final    Comment: (NOTE) Fact Sheet for Patients: bloggercourse.com  Fact Sheet for Healthcare Providers: seriousbroker.it  This test is not yet approved or cleared by the [place]  FDA and has been authorized for detection and/or diagnosis of SARS-CoV-2 by FDA under an Emergency Use Authorization (EUA). This EUA will remain in effect (meaning this test can be used) for the duration of the COVID-19 declaration under Section 564(b)(1) of the Act, 21 U.S.C. section 360bbb-3(b)(1), unless the authorization is terminated or revoked.  Performed at [facility], [address], [place], [place] [address]   Blood culture (routine x 2)     Status: None (Preliminary result)   Collection Time: 12/03/23  9:00 AM   Specimen: BLOOD  Result Value Ref Range Status   Specimen Description   Final    BLOOD RIGHT ANTECUBITAL Performed at [facility], [address], [place], [place] [address]    Special Requests   Final    BOTTLES DRAWN AEROBIC AND ANAEROBIC Blood Culture adequate volume Performed at [facility], [address], [place], [place] [address]    Culture   Final    NO GROWTH 2 DAYS Performed at [facility], [address] [place], [place] [address]    Report Status PENDING  Incomplete     Radiology Studies: CT TIBIA FIBULA LEFT W CONTRAST Result Date: 12/04/2023 EXAM: CT LEFT LOWER EXTREMITY, WITH IV CONTRAST 12/04/2023 05:39:00 PM TECHNIQUE: Axial images were acquired through the left lower extremity with IV contrast.  Reformatted images were reviewed. Automated exposure control, iterative reconstruction, and/or weight based adjustment of the mA/kV was utilized to reduce the radiation dose to as low as reasonably achievable. 100 mL of iohexol (OMNIPAQUE) 300 MG/ML solution was administered intravenously. COMPARISON: 11/30/2023. CLINICAL HISTORY: Soft tissue infection suspected (Ped 0-17y). FINDINGS: BONES AND JOINTS: Meniscal chondrocalcinosis raising the possibility of CPPD arthropathy. Patellofemoral and medial compartmental articular space narrowing with confluent degenerative subcortical cyst formation in the medial femoral condyle, spurring of the tibial spine and intercondylar notch. No significant knee joint effusion. Degenerative findings at the Lisfranc joint and in the midfoot. Lumbar calcaneal spur. Fluid/edema in K5, nonspecific. Mildly thickened distal Achilles tendon suggesting Achilles tendinopathy. No acute fracture or focal  osseous lesion. No dislocation. SOFT TISSUES: Subcutaneous fluid density structure along the anterolateral margin of the anterior compartment of the distal calf measuring 8.3 x 3.7 x 1.2 cm, internal density 35 Hounsfield units, but without a substantial degree of marginal enhancement. Appearance favors a mildly complex fluid collection. Infection is not readily excluded despite the lack of thick marginal enhancement. Chronic hematoma or shearing injury along the superficial fascial margin are differential diagnostic considerations. No specific abnormality of the underlying anterior compartmental musculature. IMPRESSION: 1. Mildly complex subcutaneous fluid collection along the anterolateral distal calf measuring 8.3 x 3.7 x 1.2 cm with internal density of 35 Hounsfield units and without substantial marginal enhancement. Differential considerations include haematoma or shearing injury favored versus less likely abscess. 2. Meniscal chondrocalcinosis raising the possibility of CPPD arthropathy.  3. Patellofemoral and medial compartment degenerative changes with joint space narrowing, degenerative subcortical cysts in the medial femoral condyle, and spurring of the tibial spine and intercondylar notch. 4. Degenerative changes at the Lisfranc joint and in the midfoot. 5. Mildly thickened distal Achilles tendon suggesting Achilles tendinopathy. Electronically signed by: Ryan Salvage MD 12/04/2023 06:32 PM EDT RP Workstation: [id number]    Scheduled Meds:  collagenase   Topical Daily   enoxaparin (LOVENOX) injection  40 mg Subcutaneous Q24H   lisinopril   2.5 mg Oral QHS   simvastatin   10 mg Oral QHS   Continuous Infusions:  cefTRIAXone (ROCEPHIN)  IV 1 g (12/05/23 0933)   vancomycin Stopped (12/05/23 0915)     LOS: 2 days   Ivonne Mustache, MD [facility] P10/31/2025, 11:17 AM

## 2023-12-05 NOTE — Consult Note (Addendum)
 Orthopedic Consult  Patient ID: Melissa Cervantes MRN: [id number] DOB/AGE: 1940-05-26 83 y.o.  Reason for Consult: Left flank pain Referring Physician: Jillian  HPI: Melissa Cervantes is an 83 y.o. female who dropped a load of wood on her leg approximately 10 days ago.  She felt pain in the left leg.  She was initially seen in the emergency room approximately 5 days ago.  She was diagnosed with cellulitis over the left shin.  She was placed on Keflex.  She was told return if her leg had not improved.  The leg did not improve so she returned approximately 2 days ago and was admitted.  At that time there was some progression of the swelling and erythema on her calf.  She was started on antibiotics.  There has been some improvement.  CT scan showed a possible fluid collection prompting our consultation  Past Medical History:  Diagnosis Date   Achalasia    ADHD    Allergy    Anxiety    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   Cervical dystonia    Chicken pox    Esophageal obstruction    per patient she is unable to eat solid food   Hearing loss    Hyperlipidemia 12/03/2023   Hypertension    Meniere disease    Neuromuscular disorder (HCC)    toricollis  gets Botox injections q 3-34 months   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   Osteoarthritis    Spinal stenosis     Past Surgical History:  Procedure Laterality Date   BALLOON DILATION N/A 01/30/2021   Procedure: BALLOON DILATION;  Surgeon: Shila Gustav GAILS, MD;  Location: [facility];  Service: Endoscopy;  Laterality: N/A;   BRAIN SURGERY  07/02/2023   Right frontal VP shunt   CESAREAN SECTION  1971, 1979   CYST EXCISION     Pilonidal cyst removed   ESOPHAGEAL BRUSHING  01/30/2021   Procedure: ESOPHAGEAL BRUSHING;  Surgeon: Shila Gustav GAILS, MD;  Location: [facility];  Service: Endoscopy;;   ESOPHAGOGASTRODUODENOSCOPY (EGD) WITH PROPOFOL  N/A 01/30/2021   Procedure: ESOPHAGOGASTRODUODENOSCOPY (EGD) WITH PROPOFOL ;  Surgeon:  Shila Gustav GAILS, MD;  Location: [facility];  Service: Endoscopy;  Laterality: N/A;   EYE SURGERY Bilateral    cataract removal   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   NISSEN FUNDOPLICATION  2007   TOTAL HIP ARTHROPLASTY Left 06/2012   TOTAL HIP ARTHROPLASTY Right 09/09/2023   Procedure: ARTHROPLASTY, HIP, TOTAL, ANTERIOR APPROACH;  Surgeon: Ernie Cough, MD;  Location: [facility];  Service: Orthopedics;  Laterality: Right;    Family History  Problem Relation Age of Onset   Heart disease Mother    Heart disease Father    Alcohol abuse Father    Dystonia Sister    Dystonia Brother    Colon cancer Neg Hx    Esophageal cancer Neg Hx    Stomach cancer Neg Hx     Social History:  reports that she has quit smoking. She has never used smokeless tobacco. She reports current alcohol use. She reports that she does not currently use drugs after having used the following drugs: Marijuana.  Allergies:  Allergies  Allergen Reactions   Latex Dermatitis   Lipitor [Atorvastatin ] Other (See Comments)    Fatigue and muscle aches     Medications: I have reviewed the patient's current medications.  ROS:   Exam: Blood pressure (!) 145/75, pulse 79, temperature 97.9 F (36.6 C), resp. rate 15, height  5' 1 (1.549 m), weight 52.5 kg, SpO2 97%. General: Well-appearing woman sitting in bed Orientation: Alert and oriented Mood and Affect: Mood is calm   Injured Extremity (CV, lymph, sensation, reflexes): Examination of the left leg shows bruising and a eschar over the dorsum of the left shin.  There is cellulitic changes.  These have recessed from the pen marks seen in the calf.  There is fluid collection underneath the eschar.  Intact sensation in the saphenous, sural, tibial, peroneal nerve distributions 5 out of 5 strength EHL, FHL, gastrocs    Medical Decision Making: Data: Imaging: CT scan shows an 8 x 4 x 1 cm fluid collection on the anterior  lateral calf  Labs: White cell 6.4 hemoglobin 11, hematocrit 34.3, platelets 316,    Assessment/Plan: Cellulitis left leg  The patient does have cellulitis of the left leg.  There is also fairly large fluid collection.  To me this seems more likely hematoma given the history of trauma but Fexmid the possibility of a abscess underneath the eschar.  At this point like to observe this.  Come back to reexamine her tomorrow.  If this does not improve, we may consider a needle biopsy to see if there is any purulent material in the area.  Certainly if there is purulence, this require surgical debridement.  Will reassess the patient in the morning.  She should continue her IV antibiotics.  Continue the current dressing.  New problem w/ workup planned: High complexity diagnosis (Level 5) Surgery w/ risks or Emergency surgery: High complexity Risk (Level 5)  All others are Level 4 with comprehensive musculoskeletal exam.  Cordella Rhein, MD, MS [facility] [phone]

## 2023-12-05 NOTE — Plan of Care (Signed)
  Problem: Education: Goal: Knowledge of General Education information will improve Description: Including pain rating scale, medication(s)/side effects and non-pharmacologic comfort measures Outcome: Progressing   Problem: Clinical Measurements: Goal: Ability to maintain clinical measurements within normal limits will improve Outcome: Progressing Goal: Will remain free from infection Outcome: Progressing   Problem: Nutrition: Goal: Adequate nutrition will be maintained Outcome: Progressing   Problem: Coping: Goal: Level of anxiety will decrease Outcome: Progressing   Problem: Elimination: Goal: Will not experience complications related to bowel motility Outcome: Progressing Goal: Will not experience complications related to urinary retention Outcome: Progressing   Problem: Skin Integrity: Goal: Risk for impaired skin integrity will decrease Outcome: Progressing   Problem: Skin Integrity: Goal: Skin integrity will improve Outcome: Progressing

## 2023-12-06 ENCOUNTER — Other Ambulatory Visit: Payer: Self-pay

## 2023-12-06 DIAGNOSIS — L03116 Cellulitis of left lower limb: Secondary | ICD-10-CM | POA: Diagnosis not present

## 2023-12-06 LAB — CREATININE, SERUM
Creatinine, Ser: 0.58 mg/dL (ref 0.44–1.00)
GFR, Estimated: >60 mL/min (ref >60)

## 2023-12-06 MED ORDER — AMOXICILLIN-POT CLAVULANATE 875-125 MG PO TABS
1.0000 | ORAL_TABLET | Freq: Two times a day (BID) | ORAL | Status: DC
  Administered 2023-12-07: 1 via ORAL
  Filled 2023-12-06: qty 1

## 2023-12-06 MED ORDER — POLYETHYLENE GLYCOL 3350 17 G PO PACK
17.0000 g | PACK | Freq: Every day | ORAL | Status: DC
  Administered 2023-12-06: 17 g via ORAL
  Filled 2023-12-06 (×2): qty 1

## 2023-12-06 MED ORDER — DOXYCYCLINE HYCLATE 100 MG PO TABS
100.0000 mg | ORAL_TABLET | Freq: Two times a day (BID) | ORAL | Status: DC
  Administered 2023-12-07: 100 mg via ORAL
  Filled 2023-12-06: qty 1

## 2023-12-06 MED ORDER — SENNOSIDES-DOCUSATE SODIUM 8.6-50 MG PO TABS
1.0000 | ORAL_TABLET | Freq: Two times a day (BID) | ORAL | Status: DC
  Administered 2023-12-06 (×2): 1 via ORAL
  Filled 2023-12-06 (×3): qty 1

## 2023-12-06 NOTE — TOC Progression Note (Addendum)
 Transition of Care [facility]) - Progression Note    Patient Details  Name: Melissa Cervantes MRN: [id number] Date of Birth: 01/17/1941  [facility] of [facility]) CM/SW Contact  Sonda Manuella Quill, RN Phone Number: 12/06/2023, 5:17 PM  Clinical Narrative:    Orders received for HHPT, and HHRN for wound care; spoke w/ pt and niece/caregiver Charlene Muzzy [phone]) in room; they agree to recc services, their agency of preference is [facility]; Charlene said she will be out of town 12/10/23-12/16/23; she also said pt has appt at [facility] on 12/23/23; referral given to [facility] [place]; he said agency can provide service; he was given contact info for [facility] since pt [facility]; pt/caregiver notified; agency contact info placed in follow up provider section of d/c instructions.   Expected Discharge Plan: Home/Self Care Barriers to Discharge: Continued Medical Work up               Expected Discharge Plan and Services In-house Referral: NA Discharge Planning Services: Follow-up appt scheduled, CM Consult Post Acute Care Choice: NA Living arrangements for the past 2 months: Single Family Home                 DME Arranged: N/A DME Agency: NA       HH Arranged: NA HH Agency: NA         Social Drivers of Health (SDOH) Interventions SDOH Screenings   Food Insecurity: No Food Insecurity (12/03/2023)  Housing: Low Risk  (12/03/2023)  Transportation Needs: No Transportation Needs (12/03/2023)  Utilities: At Risk (12/03/2023)  Alcohol Screen: Low Risk  (05/24/2022)  Depression (PHQ2-9): Low Risk  (05/24/2022)  Financial Resource Strain: Low Risk  (05/24/2022)  Physical Activity: Sufficiently Active (05/24/2022)  Social Connections: Socially Isolated (12/03/2023)  Stress: No Stress Concern Present (05/24/2022)  Tobacco Use: Medium Risk (12/03/2023)    Readmission Risk Interventions    12/04/2023   11:44 AM  Readmission Risk Prevention Plan  Post Dischage Appt Complete  Medication  Screening Complete  Transportation Screening Complete

## 2023-12-06 NOTE — Progress Notes (Signed)
 PROGRESS NOTE  Melissa Cervantes  FMW:[id number] DOB: [birth year] DOA: 12/03/2023 PCP: Merna Huxley, NP   Brief Narrative: Patient is a 83 year old female with history of achalasia, esophageal obstruction, ADHD, anxiety, Mnire's disease, hearing loss, hyperlipidemia, hypertension who presented with complaint of edema, redness of the left lower extremity.  Reported sustaining a wound about 2 weeks ago when a large item fell to her left lower extremity.  She was taking Keflex which did not help..  No history of fever or chills.  On presentation, she was hemodynamically stable.  Lab work showed WC count of 9.5.  Left/fibula done about 3 days ago did not show any acute fracture or dislocation but soft tissue edema.  Patient admitted for the management of left lower extremity cellulitis.  Started on broad-spectrum antibiotics.CT imaging of the left lower extremity on 10/30 showed underlying collection, cudnt  rule out abscess.  Orthopedics consulted .  No plan for surgical intervention.  Plan for discharge tomorrow to home with oral antibiotics  Assessment & Plan:  Principal Problem:   Cellulitis of left lower extremity Active Problems:   Cervical dystonia   ADHD   Spinal stenosis   Hyperlipidemia   Hypertension  Cellulitis of the left lower extremity:presented with complaint of edema, redness of the left lower extremity.  Reported sustaining a wound about 2 weeks ago when a large item fell to her left lower extremity.  She was taking Keflex was did not help.Left/fibula done a few days ago did not show any acute fracture or dislocation but soft tissue edema..  Started on vancomycin, ceftriaxone, follow-up cultures.NGTD. CT of left lower extremity done on 10/30 showed mildly complex subcutaneous fluid collection along the anterolateral distal calf measuring 8.3 x 3.7 x 1.2 cm likely hematoma, less likely abscess.  We consulted orthopedics,Dr Reyne .  Bedside aspiration done today with return of  thick dark-colored blood no abscess.  Plan to continue IV antibiotics today and discharge her tomorrow with oral antibiotics.She already has appointment with outpatient wound care on 11/18.Will consult PT  History of ADHD: On Adderall  History of hyperlipidemia: Statin  History of hypertension: On lisinopril  at home  Debility/Deconditioning: Patient lives alone.  Son is concerned about being alone and lacking appropriate care.  Will consult PT         DVT prophylaxis:enoxaparin (LOVENOX) injection 40 mg Start: 12/03/23 2200     Code Status: Full Code  Family Communication: Called and discussed with son Worth on phone on 10/31.  Discussed with niece Charlene on phone on 11/1  Patient status:Inpatient  Patient is from :Home  Anticipated discharge un:[facility]  Estimated DC date:tomorrow   Consultants: Orthopedics  Procedures:None  Antimicrobials:  Anti-infectives (From admission, onward)    Start     Dose/Rate Route Frequency Ordered Stop   12/07/23 1000  doxycycline  (VIBRA -TABS) tablet 100 mg        100 mg Oral Every 12 hours 12/06/23 1042 12/14/23 0959   12/07/23 1000  amoxicillin-clavulanate (AUGMENTIN) 875-125 MG per tablet 1 tablet        1 tablet Oral Every 12 hours 12/06/23 1042 12/14/23 0959   12/04/23 0900  vancomycin (VANCOREADY) IVPB 750 mg/150 mL        750 mg 150 mL/hr over 60 Minutes Intravenous Every 24 hours 12/03/23 1125 12/06/23 0944   12/03/23 1115  cefTRIAXone (ROCEPHIN) 1 g in sodium chloride  0.9 % 100 mL IVPB        1 g 200 mL/hr over 30 Minutes Intravenous  Daily 12/03/23 1105 12/06/23 1029   12/03/23 0830  vancomycin (VANCOCIN) IVPB 1000 mg/200 mL premix        1,000 mg 200 mL/hr over 60 Minutes Intravenous  Once 12/03/23 0817 12/03/23 1024       Subjective: Patient seen and examined at bedside today.  Hemodynamically stable.  Overall comfortable.  Lying on bed.  No new complaints.  Bedside aspiration done on the left leg wound.  Wound appears to  be smaller in size today with less fluctuation and erythema.  Objective: Vitals:   12/05/23 0456 12/05/23 1354 12/05/23 1937 12/06/23 0517  BP: 128/63 (!) 145/75 (!) 153/77 129/75  Pulse: 63 79 82 69  Resp: 18 15 18 18   Temp: 97.9 F (36.6 C) 97.9 F (36.6 C) 98.6 F (37 C) 97.9 F (36.6 C)  TempSrc:    Oral  SpO2: 97%  97%   Weight:      Height:        Intake/Output Summary (Last 24 hours) at 12/06/2023 1135 Last data filed at 12/05/2023 1700 Gross per 24 hour  Intake 120 ml  Output --  Net 120 ml   Filed Weights   12/03/23 1331  Weight: 52.5 kg    Examination:  General exam: Overall comfortable, not in distress HEENT: PERRL Respiratory system:  no wheezes or crackles  Cardiovascular system: S1 & S2 heard, RRR.  Gastrointestinal system: Abdomen is nondistended, soft and nontender. Central nervous system: Alert and oriented Extremities: Central black eschar with surrounding erythema on the distal left leg, surrounding fluctuance.Improved appearance Skin: No rashes, no ulcers,no icterus      Data Reviewed: I have personally reviewed following labs and imaging studies  CBC: Recent Labs  Lab 12/03/23 0822 12/04/23 0459  WBC 9.5 6.4  NEUTROABS 5.7  --   HGB 12.1 11.0*  HCT 38.5 34.3*  MCV 97.7 96.1  PLT 344 316   Basic Metabolic Panel: Recent Labs  Lab 12/03/23 0822 12/04/23 0459 12/06/23 0504  NA 140 142  --   K 3.9 3.9  --   CL 104 108  --   CO2 24 24  --   GLUCOSE 96 92  --   BUN 16 14  --   CREATININE 0.74 0.63 0.58  CALCIUM  10.0 9.5  --      Recent Results (from the past 240 hours)  Blood culture (routine x 2)     Status: None (Preliminary result)   Collection Time: 12/03/23  8:22 AM   Specimen: BLOOD  Result Value Ref Range Status   Specimen Description   Final    BLOOD SITE NOT SPECIFIED Performed at [facility], [address] [place], [place] [address]    Special Requests   Final    BOTTLES DRAWN AEROBIC AND  ANAEROBIC Blood Culture adequate volume Performed at [facility], [address], [place], [place] [address]    Culture   Final    NO GROWTH 3 DAYS Performed at [facility], [address] [place], [place] [address]    Report Status PENDING  Incomplete  Resp panel by RT-PCR (RSV, Flu A&B, Covid) Peripheral     Status: None   Collection Time: 12/03/23  8:47 AM   Specimen: Peripheral; Nasal Swab  Result Value Ref Range Status   SARS Coronavirus 2 by RT PCR NEGATIVE NEGATIVE Final    Comment: (NOTE) SARS-CoV-2 target nucleic acids are NOT DETECTED.  The SARS-CoV-2 RNA is generally detectable  in upper respiratory specimens during the acute phase of infection. The lowest concentration of SARS-CoV-2 viral copies this assay can detect is 138 copies/mL. A negative result does not preclude SARS-Cov-2 infection and should not be used as the sole basis for treatment or other patient management decisions. A negative result may occur with  improper specimen collection/handling, submission of specimen other than nasopharyngeal swab, presence of viral mutation(s) within the areas targeted by this assay, and inadequate number of viral copies(<138 copies/mL). A negative result must be combined with clinical observations, patient history, and epidemiological information. The expected result is Negative.  Fact Sheet for Patients:  bloggercourse.com  Fact Sheet for Healthcare Providers:  seriousbroker.it  This test is no t yet approved or cleared by the [place]  FDA and  has been authorized for detection and/or diagnosis of SARS-CoV-2 by FDA under an Emergency Use Authorization (EUA). This EUA will remain  in effect (meaning this test can be used) for the duration of the COVID-19 declaration under Section 564(b)(1) of the Act, 21 U.S.C.section 360bbb-3(b)(1), unless the authorization is terminated  or revoked  sooner.       Influenza A by PCR NEGATIVE NEGATIVE Final   Influenza B by PCR NEGATIVE NEGATIVE Final    Comment: (NOTE) The Xpert Xpress SARS-CoV-2/FLU/RSV plus assay is intended as an aid in the diagnosis of influenza from Nasopharyngeal swab specimens and should not be used as a sole basis for treatment. Nasal washings and aspirates are unacceptable for Xpert Xpress SARS-CoV-2/FLU/RSV testing.  Fact Sheet for Patients: bloggercourse.com  Fact Sheet for Healthcare Providers: seriousbroker.it  This test is not yet approved or cleared by the [place]  FDA and has been authorized for detection and/or diagnosis of SARS-CoV-2 by FDA under an Emergency Use Authorization (EUA). This EUA will remain in effect (meaning this test can be used) for the duration of the COVID-19 declaration under Section 564(b)(1) of the Act, 21 U.S.C. section 360bbb-3(b)(1), unless the authorization is terminated or revoked.     Resp Syncytial Virus by PCR NEGATIVE NEGATIVE Final    Comment: (NOTE) Fact Sheet for Patients: bloggercourse.com  Fact Sheet for Healthcare Providers: seriousbroker.it  This test is not yet approved or cleared by the [place]  FDA and has been authorized for detection and/or diagnosis of SARS-CoV-2 by FDA under an Emergency Use Authorization (EUA). This EUA will remain in effect (meaning this test can be used) for the duration of the COVID-19 declaration under Section 564(b)(1) of the Act, 21 U.S.C. section 360bbb-3(b)(1), unless the authorization is terminated or revoked.  Performed at [facility], [address] [place], [place] [address]   Blood culture (routine x 2)     Status: None (Preliminary result)   Collection Time: 12/03/23  9:00 AM   Specimen: BLOOD  Result Value Ref Range Status   Specimen Description   Final    BLOOD RIGHT  ANTECUBITAL Performed at [facility], [address] [place], [place] [address]    Special Requests   Final    BOTTLES DRAWN AEROBIC AND ANAEROBIC Blood Culture adequate volume Performed at [facility], [address], [place], [place] [address]    Culture   Final    NO GROWTH 3 DAYS Performed at [facility], [address] [place], [place] [address]    Report Status PENDING  Incomplete     Radiology Studies: CT TIBIA FIBULA LEFT W CONTRAST Result Date: 12/04/2023 EXAM: CT LEFT LOWER EXTREMITY, WITH  IV CONTRAST 12/04/2023 05:39:00 PM TECHNIQUE: Axial images were acquired through the left lower extremity with IV contrast. Reformatted images were reviewed. Automated exposure control, iterative reconstruction, and/or weight based adjustment of the mA/kV was utilized to reduce the radiation dose to as low as reasonably achievable. 100 mL of iohexol (OMNIPAQUE) 300 MG/ML solution was administered intravenously. COMPARISON: 11/30/2023. CLINICAL HISTORY: Soft tissue infection suspected (Ped 0-17y). FINDINGS: BONES AND JOINTS: Meniscal chondrocalcinosis raising the possibility of CPPD arthropathy. Patellofemoral and medial compartmental articular space narrowing with confluent degenerative subcortical cyst formation in the medial femoral condyle, spurring of the tibial spine and intercondylar notch. No significant knee joint effusion. Degenerative findings at the Lisfranc joint and in the midfoot. Lumbar calcaneal spur. Fluid/edema in K5, nonspecific. Mildly thickened distal Achilles tendon suggesting Achilles tendinopathy. No acute fracture or focal osseous lesion. No dislocation. SOFT TISSUES: Subcutaneous fluid density structure along the anterolateral margin of the anterior compartment of the distal calf measuring 8.3 x 3.7 x 1.2 cm, internal density 35 Hounsfield units, but without a substantial degree of marginal enhancement. Appearance favors a mildly  complex fluid collection. Infection is not readily excluded despite the lack of thick marginal enhancement. Chronic hematoma or shearing injury along the superficial fascial margin are differential diagnostic considerations. No specific abnormality of the underlying anterior compartmental musculature. IMPRESSION: 1. Mildly complex subcutaneous fluid collection along the anterolateral distal calf measuring 8.3 x 3.7 x 1.2 cm with internal density of 35 Hounsfield units and without substantial marginal enhancement. Differential considerations include haematoma or shearing injury favored versus less likely abscess. 2. Meniscal chondrocalcinosis raising the possibility of CPPD arthropathy. 3. Patellofemoral and medial compartment degenerative changes with joint space narrowing, degenerative subcortical cysts in the medial femoral condyle, and spurring of the tibial spine and intercondylar notch. 4. Degenerative changes at the Lisfranc joint and in the midfoot. 5. Mildly thickened distal Achilles tendon suggesting Achilles tendinopathy. Electronically signed by: Ryan Salvage MD 12/04/2023 06:32 PM EDT RP Workstation: [id number]    Scheduled Meds:  NOREEN ON 12/07/2023] amoxicillin-clavulanate  1 tablet Oral Q12H   amphetamine -dextroamphetamine   10 mg Oral BID   clonazePAM   0.5 mg Oral QHS   collagenase   Topical Daily   [START ON 12/07/2023] doxycycline   100 mg Oral Q12H   enoxaparin (LOVENOX) injection  40 mg Subcutaneous Q24H   lisinopril   2.5 mg Oral QHS   simvastatin   10 mg Oral QHS   Continuous Infusions:     LOS: 3 days   Ivonne Mustache, MD [facility] P11/02/2023, 11:35 AM

## 2023-12-06 NOTE — Plan of Care (Signed)

## 2023-12-06 NOTE — Progress Notes (Signed)
     Subjective:  Patient reports continues comfort in the left shin but she does feel somewhat better than yesterday  Yesterday's total administered Morphine Milligram Equivalents: 0   Objective:   VITALS:   Vitals:   12/05/23 0456 12/05/23 1354 12/05/23 1937 12/06/23 0517  BP: 128/63 (!) 145/75 (!) 153/77 129/75  Pulse: 63 79 82 69  Resp: 18 15 18 18   Temp: 97.9 F (36.6 C) 97.9 F (36.6 C) 98.6 F (37 C) 97.9 F (36.6 C)  TempSrc:    Oral  SpO2: 97%  97%   Weight:      Height:        Well-appearing woman in no acute distress.  The cellulitis on her left leg appears to be slightly improved from yesterday.  Still there fluctuance underneath the eschar.  Lab Results  Component Value Date   WBC 6.4 12/04/2023   HGB 11.0 (L) 12/04/2023   HCT 34.3 (L) 12/04/2023   MCV 96.1 12/04/2023   PLT 316 12/04/2023   BMET    Component Value Date/Time   NA 142 12/04/2023 0459   K 3.9 12/04/2023 0459   CL 108 12/04/2023 0459   CO2 24 12/04/2023 0459   GLUCOSE 92 12/04/2023 0459   BUN 14 12/04/2023 0459   CREATININE 0.58 12/06/2023 0504   CALCIUM  9.5 12/04/2023 0459   GFRNONAA >60 12/06/2023 0504    Procedure: After obtaining position from the patient, the area around the eschar was cleaned with alcohol.  An 18-gauge needle was inserted.  Approximately 3 cc of dark-colored blood was removed.  No signs of purulence or infection.  The wound was dressed with 4 x 4's and a Mepilex dressing    Assessment/Plan:     Principal Problem:   Cellulitis of left lower extremity Active Problems:   Cervical dystonia   ADHD   Spinal stenosis   Hyperlipidemia   Hypertension  The patient continues to have cellulitis over the left shin but this does appear to be improving.  There are still some fluctuance underneath the eschar.  To determine this is an infection versus a hematoma, I performed an aspiration.  This returned just thick dark-colored blood most consistent with a hematoma  and no signs of infection.  Given this, I would not recommend surgical inventions for the patient.  She should continue with antibiotics for her cellulitis.  I would defer to medicine as to whether not she be switched to oral agents or continue with IV agents.  With regards to the eschar and hematoma, I would recommend continue local wound care.  If the patient would tolerate it, a compressive dressing might help to decrease the hematoma.  From an orthopedic standpoint, she can be discharged to home when appropriate from medicine, but will continue to follow her while she is here in the hospital.    Cordella SHAUNNA Rhein 12/06/2023, 9:19 AM   Cathlyn Rhein, MD  Contact information:   [phone] 7am-5pm epic message Dr. Rhein, or call office for patient follow up: [phone] After hours and holidays please check [email or web address] for group call information for [facility]

## 2023-12-06 NOTE — Evaluation (Signed)
 Physical Therapy Evaluation-1x Patient Details Name: Melissa Cervantes MRN: [id number] DOB: 08-09-1940 Today's Date: 12/06/2023  History of Present Illness  83 yo female admitted with L lower leg cellulitis, hematoma. 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 She ambulated ~450 feet around the unit. She tolerated activity well. Minimal pain reported. She has been mobilizing in her room unassisted and has been ambulatory in the hallway already today. Discussed d/c plan-she will return home where she lives alone. She is interested in HHPT f/u for general strength and balance training(if insurance will cover it)-reports recent R THA earlier this year as well. Will recommend HHPT f/u. 1x eval.        If plan is discharge home, recommend the following:     Can travel by private vehicle        Equipment Recommendations None recommended by PT  Recommendations for Other Services       Functional Status Assessment Patient has had a recent decline in their functional status and demonstrates the ability to make significant improvements in function in a reasonable and predictable amount of time.     Precautions / Restrictions Precautions Precautions: None Restrictions Weight Bearing Restrictions Per Provider Order: No      Mobility  Bed Mobility Overal bed mobility: Modified Independent                  Transfers Overall transfer level: Modified independent                      Ambulation/Gait Ambulation/Gait assistance: Modified independent (Device/Increase time) Gait Distance (Feet): 450 Feet Assistive device: None Gait Pattern/deviations: Decreased stride length       General Gait Details: Decreased stride length. Fair gait speed. No LOB. Tolerated distance well  Stairs            Wheelchair Mobility     Tilt Bed     Modified Rankin (Stroke Patients Only)       Balance Overall balance assessment: Mild deficits observed, not formally tested (pt able to pick up object off floor with CGA)                                           Pertinent Vitals/Pain Pain Assessment Pain Assessment: Faces Faces Pain Scale: Hurts a little bit Pain Location: L lower leg Pain Descriptors / Indicators:  (hot) Pain Intervention(s): Monitored during session    Home Living Family/patient expects to be discharged to:: Private residence Living Arrangements: Alone Available Help at Discharge: Family;Available PRN/intermittently Type of Home: House Home Access: Stairs to enter Entrance Stairs-Rails: None Entrance Stairs-Number of Steps: 1   Home Layout: One level Home Equipment: Agricultural Consultant (2 wheels) Additional Comments: dtr to assist x1 wk and then nbor/friend [employer]    Prior Function Prior Level of Function : Independent/Modified Independent             Mobility Comments: ambulatory without a device ADLs Comments: mod ind     Extremity/Trunk Assessment   Upper Extremity Assessment Upper Extremity Assessment: Overall WFL for tasks assessed    Lower Extremity Assessment Lower Extremity Assessment: Generalized weakness    Cervical / Trunk Assessment Cervical / Trunk Assessment: Normal  Communication   Communication  Communication: Impaired Factors Affecting Communication: Hearing impaired    Cognition Arousal: Alert Behavior During Therapy: WFL for tasks assessed/performed   PT - Cognitive impairments: No apparent impairments                                 Cueing Cueing Techniques: Verbal cues     General Comments      Exercises     Assessment/Plan    PT Assessment All further PT needs can be met in the next venue of care (HHPT for general strength and balance)  PT Problem List Decreased strength;Decreased balance       PT Treatment  Interventions      PT Goals (Current goals can be found in the Care Plan section)  Acute Rehab PT Goals Patient Stated Goal: home soon PT Goal Formulation: All assessment and education complete, DC therapy    Frequency       Co-evaluation               AM-PAC PT 6 Clicks Mobility  Outcome Measure Help needed turning from your back to your side while in a flat bed without using bedrails?: None Help needed moving from lying on your back to sitting on the side of a flat bed without using bedrails?: None Help needed moving to and from a bed to a chair (including a wheelchair)?: None Help needed standing up from a chair using your arms (e.g., wheelchair or bedside chair)?: None Help needed to walk in hospital room?: None Help needed climbing 3-5 steps with a railing? : A Little 6 Click Score: 23    End of Session   Activity Tolerance: Patient tolerated treatment well Patient left: in bed;with call bell/phone within reach   PT Visit Diagnosis: Muscle weakness (generalized) (M62.81)    Time: 8583-8570 PT Time Calculation (min) (ACUTE ONLY): 13 min   Charges:   PT Evaluation $PT Eval Low Complexity: 1 Low   PT General Charges $$ ACUTE PT VISIT: 1 Visit           Dannial SQUIBB, PT Acute Rehabilitation  Office: [phone]

## 2023-12-07 ENCOUNTER — Other Ambulatory Visit: Payer: Self-pay | Admitting: Adult Health

## 2023-12-07 ENCOUNTER — Other Ambulatory Visit (HOSPITAL_COMMUNITY): Payer: Self-pay

## 2023-12-07 ENCOUNTER — Encounter: Payer: Self-pay | Admitting: Adult Health

## 2023-12-07 ENCOUNTER — Other Ambulatory Visit (HOSPITAL_BASED_OUTPATIENT_CLINIC_OR_DEPARTMENT_OTHER): Payer: Self-pay

## 2023-12-07 DIAGNOSIS — L03116 Cellulitis of left lower limb: Secondary | ICD-10-CM | POA: Diagnosis not present

## 2023-12-07 DIAGNOSIS — F909 Attention-deficit hyperactivity disorder, unspecified type: Secondary | ICD-10-CM

## 2023-12-07 LAB — CREATININE, SERUM
Creatinine, Ser: 0.56 mg/dL (ref 0.44–1.00)
GFR, Estimated: >60 mL/min (ref >60)

## 2023-12-07 MED ORDER — AMOXICILLIN-POT CLAVULANATE 875-125 MG PO TABS
1.0000 | ORAL_TABLET | Freq: Two times a day (BID) | ORAL | 0 refills | Status: AC
  Filled 2023-12-07: qty 14, 7d supply, fill #0

## 2023-12-07 MED ORDER — POLYETHYLENE GLYCOL 3350 17 GM/SCOOP PO POWD
17.0000 g | Freq: Every day | ORAL | 0 refills | Status: DC
  Filled 2023-12-07: qty 238, 14d supply, fill #0

## 2023-12-07 MED ORDER — DOXYCYCLINE HYCLATE 100 MG PO TABS
100.0000 mg | ORAL_TABLET | Freq: Two times a day (BID) | ORAL | 0 refills | Status: AC
  Filled 2023-12-07: qty 14, 7d supply, fill #0

## 2023-12-07 MED ORDER — COLLAGENASE 250 UNIT/GM EX OINT
TOPICAL_OINTMENT | Freq: Every day | CUTANEOUS | 0 refills | Status: DC
  Filled 2023-12-07: qty 30, fill #0

## 2023-12-07 NOTE — Plan of Care (Signed)

## 2023-12-07 NOTE — Plan of Care (Signed)
  Problem: Safety: Goal: Ability to remain free from injury will improve Outcome: Progressing   Problem: Pain Managment: Goal: General experience of comfort will improve and/or be controlled Outcome: Progressing

## 2023-12-07 NOTE — Discharge Summary (Signed)
 Physician Discharge Summary  Melissa Cervantes FMW:[id number] DOB: 1940/09/22 DOA: 12/03/2023  PCP: Merna Huxley, NP  Admit date: 12/03/2023 Discharge date: 12/07/2023  Admitted From: Home Disposition:  Home  Discharge Condition:Stable CODE STATUS:FULL Diet recommendation: Heart Healthy  Brief/Interim Summary: Patient is a 83 year old female with history of achalasia, esophageal obstruction, ADHD, anxiety, Mnire's disease, hearing loss, hyperlipidemia, hypertension who presented with complaint of edema, redness of the left lower extremity.  Reported sustaining a wound about 2 weeks ago when a large item fell to her left lower extremity.  She was taking Keflex which did not help..  No history of fever or chills.  On presentation, she was hemodynamically stable.  Lab work showed WC count of 9.5.  Left/fibula done about 3 days ago did not show any acute fracture or dislocation but soft tissue edema.  Patient admitted for the management of left lower extremity cellulitis.  Started on broad-spectrum antibiotics.CT imaging of the left lower extremity on 10/30 showed underlying collection, cudnt  rule out abscess.  Orthopedics consulted . No plan for surgical intervention. Medically stable for discharge today to home with oral antibiotics  Following problems were addressed during the hospitalization:  Cellulitis of the left lower extremity:presented with complaint of edema, redness of the left lower extremity.  Reported sustaining a wound about 2 weeks ago when a large item fell to her left lower extremity.  She was taking Keflex was did not help.Left/fibula done a few days ago did not show any acute fracture or dislocation but soft tissue edema  Started on vancomycin, ceftriaxone, cultures:NGTD. CT of left lower extremity done on 10/30 showed mildly complex subcutaneous fluid collection along the anterolateral distal calf measuring 8.3 x 3.7 x 1.2 cm likely hematoma, less likely abscess.  We consulted  orthopedics,Dr Reyne .  Bedside aspiration done  with return of thick dark-colored blood no abscess.  Continue oral antibiotics on discharge.  She already has appointment with outpatient wound care on 11/18.PT recommended home health.   History of ADHD: On Adderall   History of hyperlipidemia: Statin   History of hypertension: On lisinopril  at home   Debility/Deconditioning: Patient lives alone.  PT recommended [facility]   Discharge Diagnoses:  Principal Problem:   Cellulitis of left lower extremity Active Problems:   Cervical dystonia   ADHD   Spinal stenosis   Hyperlipidemia   Hypertension    Discharge Instructions  Discharge Instructions     Diet - low sodium heart healthy   Complete by: As directed    Discharge instructions   Complete by: As directed    1)Please take your medications as instructed 2)Follow up with your PCP next week 3)Follow up with outpatient wound care on November 18 4)You might be called by orthopedics for outpatient follow up   Discharge wound care:   Complete by: As directed    As per wound care nurse   Increase activity slowly   Complete by: As directed       Allergies as of 12/07/2023       Reactions   Latex Dermatitis   Lipitor [atorvastatin ] Other (See Comments)   Fatigue and muscle aches         Medication List     STOP taking these medications    cephALEXin 500 MG capsule Commonly known as: KEFLEX       TAKE these medications    albuterol  108 (90 Base) MCG/ACT inhaler Commonly known as: VENTOLIN  HFA INHALE 1-2 PUFFS BY MOUTH EVERY 6 HOURS  AS NEEDED FOR WHEEZE OR SHORTNESS OF BREATH   amoxicillin-clavulanate 875-125 MG tablet Commonly known as: AUGMENTIN Take 1 tablet by mouth every 12 (twelve) hours for 7 days.   amphetamine -dextroamphetamine  15 MG tablet Commonly known as: Adderall Take 1 tablet by mouth 2 (two) times daily.   Arnuity Ellipta  100 MCG/ACT Aepb Generic drug: Fluticasone  Furoate INHALE 1 PUFF BY  MOUTH EVERY DAY   clonazePAM  0.5 MG tablet Commonly known as: KLONOPIN  Take 1 tablet (0.5 mg total) by mouth 2 (two) times a day as needed (dystonic spasms). What changed: when to take this   collagenase 250 UNIT/GM ointment Commonly known as: SANTYL Apply topically daily.   doxycycline  100 MG tablet Commonly known as: VIBRA -TABS Take 1 tablet (100 mg total) by mouth every 12 (twelve) hours for 7 days.   lisinopril  2.5 MG tablet Commonly known as: ZESTRIL  Take 1 tablet (2.5 mg total) by mouth every evening.   methocarbamol  500 MG tablet Commonly known as: ROBAXIN  Take 1 tablet (500 mg total) by mouth every 6 (six) hours as needed for muscle spasms.   multivitamin with minerals Tabs tablet Take 1 tablet by mouth in the morning. Vita Fusion Multivitamin   oxyCODONE  5 MG immediate release tablet Commonly known as: Oxy IR/ROXICODONE  Take 0.5-1 tablets (2.5-5 mg total) by mouth every 4 (four) hours as needed for severe pain (pain score 7-10).   polyethylene glycol 17 g packet Commonly known as: MIRALAX  / GLYCOLAX  Take 17 g by mouth daily.   simvastatin  10 MG tablet Commonly known as: ZOCOR  Take 1 tablet (10 mg total) by mouth at bedtime.   tiZANidine  2 MG tablet Commonly known as: ZANAFLEX  Take 1-2 tablets (2-4 mg total) by mouth every evening for sleep/muscle pain.               Discharge Care Instructions  (From admission, onward)           Start     Ordered   12/07/23 0000  Discharge wound care:       Comments: As per wound care nurse   12/07/23 1019             Allergies  Allergen Reactions   Latex Dermatitis   Lipitor [Atorvastatin ] Other (See Comments)    Fatigue and muscle aches     Consultations: Orthopedics   Procedures/Studies: CT TIBIA FIBULA LEFT W CONTRAST Result Date: 12/04/2023 EXAM: CT LEFT LOWER EXTREMITY, WITH IV CONTRAST 12/04/2023 05:39:00 PM TECHNIQUE: Axial images were acquired through the left lower extremity with IV  contrast. Reformatted images were reviewed. Automated exposure control, iterative reconstruction, and/or weight based adjustment of the mA/kV was utilized to reduce the radiation dose to as low as reasonably achievable. 100 mL of iohexol (OMNIPAQUE) 300 MG/ML solution was administered intravenously. COMPARISON: 11/30/2023. CLINICAL HISTORY: Soft tissue infection suspected (Ped 0-17y). FINDINGS: BONES AND JOINTS: Meniscal chondrocalcinosis raising the possibility of CPPD arthropathy. Patellofemoral and medial compartmental articular space narrowing with confluent degenerative subcortical cyst formation in the medial femoral condyle, spurring of the tibial spine and intercondylar notch. No significant knee joint effusion. Degenerative findings at the Lisfranc joint and in the midfoot. Lumbar calcaneal spur. Fluid/edema in K5, nonspecific. Mildly thickened distal Achilles tendon suggesting Achilles tendinopathy. No acute fracture or focal osseous lesion. No dislocation. SOFT TISSUES: Subcutaneous fluid density structure along the anterolateral margin of the anterior compartment of the distal calf measuring 8.3 x 3.7 x 1.2 cm, internal density 35 Hounsfield units, but without a substantial degree of  marginal enhancement. Appearance favors a mildly complex fluid collection. Infection is not readily excluded despite the lack of thick marginal enhancement. Chronic hematoma or shearing injury along the superficial fascial margin are differential diagnostic considerations. No specific abnormality of the underlying anterior compartmental musculature. IMPRESSION: 1. Mildly complex subcutaneous fluid collection along the anterolateral distal calf measuring 8.3 x 3.7 x 1.2 cm with internal density of 35 Hounsfield units and without substantial marginal enhancement. Differential considerations include haematoma or shearing injury favored versus less likely abscess. 2. Meniscal chondrocalcinosis raising the possibility of CPPD  arthropathy. 3. Patellofemoral and medial compartment degenerative changes with joint space narrowing, degenerative subcortical cysts in the medial femoral condyle, and spurring of the tibial spine and intercondylar notch. 4. Degenerative changes at the Lisfranc joint and in the midfoot. 5. Mildly thickened distal Achilles tendon suggesting Achilles tendinopathy. Electronically signed by: Ryan Salvage MD 12/04/2023 06:32 PM EDT RP Workstation: [id number]   DG Tibia/Fibula Left Result Date: 11/30/2023 EXAM: 2 VIEW(S) XRAY OF THE LEFT TIBIA AND FIBULA 11/30/2023 02:33:48 PM COMPARISON: Left knee radiographs 01/02/2021. CLINICAL HISTORY: Left foot and leg injury. 10 days ago, the patient fell on her left leg while lifting a heavy gate. The patient reports self-treating the injury at home, noting persistent dried blood on bandages, swelling, and warmth to touch. FINDINGS: BONES AND JOINTS: No acute fracture. No focal osseous lesion. No joint dislocation. Chondrocalcinosis of the knee. SOFT TISSUES: Diffuse soft tissue edema. IMPRESSION: 1. No acute fracture or dislocation. 2. Diffuse soft tissue edema. Electronically signed by: Lonni Necessary MD 11/30/2023 03:11 PM EDT RP Workstation: [id number]   DG Foot Complete Left Result Date: 11/30/2023 EXAM: 3 OR MORE VIEW(S) XRAY OF THE LEFT FOOT 11/30/2023 02:33:48 PM COMPARISON: None available. CLINICAL HISTORY: Left foot and leg injury. 10 days ago, the patient fell down her left leg while lifting a heavy gate. The patient reports self-treating at home, with persistent dried blood on bandages, swelling, and warmth to touch. FINDINGS: BONES AND JOINTS: No acute fracture. No focal osseous lesion. No joint dislocation. Small plantar calcaneal spur. Mild hallux valgus. Mild degenerative changes of the midfoot. SOFT TISSUES: Soft tissue swelling about the forefoot. IMPRESSION: 1. Soft tissue swelling about the forefoot, possibly related to injury. 2. Mild hallux  valgus and mild midfoot degenerative changes. 3. Small plantar calcaneal spur. Electronically signed by: Lonni Necessary MD 11/30/2023 03:08 PM EDT RP Workstation: [id number]   DG Hand Complete Right Result Date: 11/08/2023 CLINICAL DATA:  Fall, injury to hand.  Pain. EXAM: RIGHT HAND - COMPLETE 3+ VIEW COMPARISON:  None Available. FINDINGS: Advanced osteoarthritis. There is a lucency through dorsal osteophyte of the index finger at the distal interphalangeal joint that may represent chronic fragmentation or osteophyte fracture. No other fracture of the hand. No joint dislocation. Chondrocalcinosis of the triangular fibrocartilage. IMPRESSION: 1. Lucency through dorsal osteophyte of the index finger at the distal interphalangeal joint may represent chronic fragmentation or osteophyte fracture. Recommend correlation for focal tenderness. 2. Advanced osteoarthritis. Electronically Signed   By: Andrea Gasman M.D.   On: 11/08/2023 19:56   CT Head Wo Contrast Result Date: 11/08/2023 CLINICAL DATA:  Trauma, fall EXAM: CT HEAD WITHOUT CONTRAST CT CERVICAL SPINE WITHOUT CONTRAST TECHNIQUE: Multidetector CT imaging of the head and cervical spine was performed following the standard protocol without intravenous contrast. Multiplanar CT image reconstructions of the cervical spine were also generated. RADIATION DOSE REDUCTION: This exam was performed according to the departmental dose-optimization program which includes automated exposure control, adjustment  of the mA and/or kV according to patient size and/or use of iterative reconstruction technique. COMPARISON:  11/02/2022 FINDINGS: CT HEAD FINDINGS Brain: No evidence of acute infarction, hemorrhage, hydrocephalus, extra-axial collection or mass lesion/mass effect. 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 Periventricular white matter hypodensity. Vascular: No hyperdense vessel  or unexpected calcification. Skull: Normal. Negative for fracture or focal lesion. Sinuses/Orbits: No acute finding. Other: None. CT CERVICAL SPINE FINDINGS Alignment: Degenerative straightening of the normal cervical lordosis. Skull base and vertebrae: No acute fracture. No primary bone lesion or focal pathologic process. Soft tissues and spinal canal: No prevertebral fluid or swelling. No visible canal hematoma. Disc levels: Moderate multilevel cervical disc degenerative disease and bridging osteophytosis. Upper chest: Negative. Other: None. IMPRESSION: 1. No acute intracranial pathology. 2. 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 3. 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 4. No fracture or static subluxation of the cervical spine. 5. Moderate multilevel cervical disc degenerative disease and bridging osteophytosis. Electronically Signed   By: Marolyn JONETTA Jaksch M.D.   On: 11/08/2023 19:32   CT Cervical Spine Wo Contrast Result Date: 11/08/2023 CLINICAL DATA:  Trauma, fall EXAM: CT HEAD WITHOUT CONTRAST CT CERVICAL SPINE WITHOUT CONTRAST TECHNIQUE: Multidetector CT imaging of the head and cervical spine was performed following the standard protocol without intravenous contrast. Multiplanar CT image reconstructions of the cervical spine were also generated. RADIATION DOSE REDUCTION: This exam was performed according to the departmental dose-optimization program which includes automated exposure control, adjustment of the mA and/or kV according to patient size and/or use of iterative reconstruction technique. COMPARISON:  11/02/2022 FINDINGS: CT HEAD FINDINGS Brain: No evidence of acute infarction, hemorrhage, hydrocephalus, extra-axial collection or mass lesion/mass effect. Right frontal approach intraventricular shunt catheter with decompression of the ventricles when compared  to prior examination. Periventricular white matter hypodensity. Vascular: No hyperdense vessel or unexpected calcification. Skull: Normal. Negative for fracture or focal lesion. Sinuses/Orbits: No acute finding. Other: None. CT CERVICAL SPINE FINDINGS Alignment: Degenerative straightening of the normal cervical lordosis. Skull base and vertebrae: No acute fracture. No primary bone lesion or focal pathologic process. Soft tissues and spinal canal: No prevertebral fluid or swelling. No visible canal hematoma. Disc levels: Moderate multilevel cervical disc degenerative disease and bridging osteophytosis. Upper chest: Negative. Other: None. IMPRESSION: 1. No acute intracranial pathology. 2. 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 3. 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 4. No fracture or static subluxation of the cervical spine. 5. Moderate multilevel cervical disc degenerative disease and bridging osteophytosis. Electronically Signed   By: Marolyn JONETTA Jaksch M.D.   On: 11/08/2023 19:32      Subjective: Patient seen and examined at bedside today.  Very comfortable.  Sitting on the bed.  No new complaints today.Eager to go home.  Discharge Exam: Vitals:   12/06/23 2053 12/07/23 0428  BP: (!) 173/66 138/63  Pulse: 87 62  Resp: 19 18  Temp: 98.1 F (36.7 C) 97.8 F (36.6 C)  SpO2: 99% 97%   Vitals:   12/06/23 1353 12/06/23 2040 12/06/23 2053 12/07/23 0428  BP: (!) 160/78 (!) 166/68 (!) 173/66 138/63  Pulse: 75 87 87 62  Resp: 16 20 19 18   Temp: 98.3 F (36.8 C) 98.2 F (36.8 C) 98.1 F (36.7 C) 97.8 F (36.6 C)  TempSrc:  Oral Oral  Oral  SpO2: 95% 99% 99% 97%  Weight:      Height:        General: Pt is alert, awake, not in acute distress Cardiovascular: RRR, S1/S2 +, no rubs, no gallops Respiratory: CTA bilaterally, no wheezing, no rhonchi Abdominal:  Soft, NT, ND, bowel sounds + Extremities: no edema, no cyanosis, cellulitis area on the distal left leg    The results of significant diagnostics from this hospitalization (including imaging, microbiology, ancillary and laboratory) are listed below for reference.     Microbiology: Recent Results (from the past 240 hours)  Blood culture (routine x 2)     Status: None (Preliminary result)   Collection Time: 12/03/23  8:22 AM   Specimen: BLOOD  Result Value Ref Range Status   Specimen Description   Final    BLOOD SITE NOT SPECIFIED Performed at [facility], [address], [place], [place] [address]    Special Requests   Final    BOTTLES DRAWN AEROBIC AND ANAEROBIC Blood Culture adequate volume Performed at [facility], [address], [place], [place] [address]    Culture   Final    NO GROWTH 4 DAYS Performed at [facility], [address] [place], [place] [address]    Report Status PENDING  Incomplete  Resp panel by RT-PCR (RSV, Flu A&B, Covid) Peripheral     Status: None   Collection Time: 12/03/23  8:47 AM   Specimen: Peripheral; Nasal Swab  Result Value Ref Range Status   SARS Coronavirus 2 by RT PCR NEGATIVE NEGATIVE Final    Comment: (NOTE) SARS-CoV-2 target nucleic acids are NOT DETECTED.  The SARS-CoV-2 RNA is generally detectable in upper respiratory specimens during the acute phase of infection. The lowest concentration of SARS-CoV-2 viral copies this assay can detect is 138 copies/mL. A negative result does not preclude SARS-Cov-2 infection and should not be used as the sole basis for treatment or other patient management decisions. A negative result may occur with  improper specimen collection/handling, submission of specimen other than nasopharyngeal swab, presence of viral mutation(s) within the areas targeted by this assay, and inadequate number of viral copies(<138 copies/mL). A negative result must be combined  with clinical observations, patient history, and epidemiological information. The expected result is Negative.  Fact Sheet for Patients:  bloggercourse.com  Fact Sheet for Healthcare Providers:  seriousbroker.it  This test is no t yet approved or cleared by the [place]  FDA and  has been authorized for detection and/or diagnosis of SARS-CoV-2 by FDA under an Emergency Use Authorization (EUA). This EUA will remain  in effect (meaning this test can be used) for the duration of the COVID-19 declaration under Section 564(b)(1) of the Act, 21 U.S.C.section 360bbb-3(b)(1), unless the authorization is terminated  or revoked sooner.       Influenza A by PCR NEGATIVE NEGATIVE Final   Influenza B by PCR NEGATIVE NEGATIVE Final    Comment: (NOTE) The Xpert Xpress SARS-CoV-2/FLU/RSV plus assay is intended as an aid in the diagnosis of influenza from Nasopharyngeal swab specimens and should not be used as a sole basis for treatment. Nasal washings and aspirates are unacceptable for Xpert Xpress SARS-CoV-2/FLU/RSV testing.  Fact Sheet for Patients: bloggercourse.com  Fact Sheet for Healthcare Providers: seriousbroker.it  This test is not yet approved or cleared by the [place]  FDA and has been authorized for detection and/or diagnosis of SARS-CoV-2 by FDA under an Emergency Use Authorization (EUA).  This EUA will remain in effect (meaning this test can be used) for the duration of the COVID-19 declaration under Section 564(b)(1) of the Act, 21 U.S.C. section 360bbb-3(b)(1), unless the authorization is terminated or revoked.     Resp Syncytial Virus by PCR NEGATIVE NEGATIVE Final    Comment: (NOTE) Fact Sheet for Patients: bloggercourse.com  Fact Sheet for Healthcare Providers: seriousbroker.it  This test is not yet approved  or cleared by the [place]  FDA and has been authorized for detection and/or diagnosis of SARS-CoV-2 by FDA under an Emergency Use Authorization (EUA). This EUA will remain in effect (meaning this test can be used) for the duration of the COVID-19 declaration under Section 564(b)(1) of the Act, 21 U.S.C. section 360bbb-3(b)(1), unless the authorization is terminated or revoked.  Performed at [facility], [address] [place], [place] [address]   Blood culture (routine x 2)     Status: None (Preliminary result)   Collection Time: 12/03/23  9:00 AM   Specimen: BLOOD  Result Value Ref Range Status   Specimen Description   Final    BLOOD RIGHT ANTECUBITAL Performed at [facility], [address], [place], [place] [address]    Special Requests   Final    BOTTLES DRAWN AEROBIC AND ANAEROBIC Blood Culture adequate volume Performed at [facility], [address] [place], [place] [address]    Culture   Final    NO GROWTH 4 DAYS Performed at [facility], [address], [place], [place] [address]    Report Status PENDING  Incomplete     Labs: BNP (last 3 results) No results for input(s): BNP in the last 8760 hours. Basic Metabolic Panel: Recent Labs  Lab 12/03/23 0822 12/04/23 0459 12/06/23 0504 12/07/23 0432  NA 140 142  --   --   K 3.9 3.9  --   --   CL 104 108  --   --   CO2 24 24  --   --   GLUCOSE 96 92  --   --   BUN 16 14  --   --   CREATININE 0.74 0.63 0.58 0.56  CALCIUM  10.0 9.5  --   --    Liver Function Tests: Recent Labs  Lab 12/04/23 0459  AST 16  ALT 6  ALKPHOS 109  BILITOT 0.5  PROT 5.7*  ALBUMIN  3.6   No results for input(s): LIPASE, AMYLASE in the last 168 hours. No results for input(s): AMMONIA in the last 168 hours. CBC: Recent Labs  Lab 12/03/23 0822 12/04/23 0459  WBC 9.5 6.4  NEUTROABS 5.7  --   HGB 12.1 11.0*  HCT 38.5 34.3*  MCV 97.7 96.1  PLT 344  316   Cardiac Enzymes: No results for input(s): CKTOTAL, CKMB, CKMBINDEX, TROPONINI in the last 168 hours. BNP: Invalid input(s): POCBNP CBG: No results for input(s): GLUCAP in the last 168 hours. D-Dimer No results for input(s): DDIMER in the last 72 hours. Hgb A1c No results for input(s): HGBA1C in the last 72 hours. Lipid Profile No results for input(s): CHOL, HDL, LDLCALC, TRIG, CHOLHDL, LDLDIRECT in the last 72 hours. Thyroid  function studies No results for input(s): TSH, T4TOTAL, T3FREE, THYROIDAB in the last 72 hours.  Invalid input(s): FREET3 Anemia work up No results for input(s): VITAMINB12, FOLATE, FERRITIN, TIBC, IRON, RETICCTPCT in the last 72 hours. Urinalysis No results found for: COLORURINE, APPEARANCEUR, LABSPEC, PHURINE, GLUCOSEU, HGBUR, BILIRUBINUR, KETONESUR, PROTEINUR, UROBILINOGEN, NITRITE, LEUKOCYTESUR  Sepsis Labs Recent Labs  Lab 12/03/23 0822 12/04/23 0459  WBC 9.5 6.4   Microbiology Recent Results (from the past 240 hours)  Blood culture (routine x 2)     Status: None (Preliminary result)   Collection Time: 12/03/23  8:22 AM   Specimen: BLOOD  Result Value Ref Range Status   Specimen Description   Final    BLOOD SITE NOT SPECIFIED Performed at [facility], [address] [place], [place] [address]    Special Requests   Final    BOTTLES DRAWN AEROBIC AND ANAEROBIC Blood Culture adequate volume Performed at [facility], [address], [place], [place] [address]    Culture   Final    NO GROWTH 4 DAYS Performed at [facility], [address] [place], [place] [address]    Report Status PENDING  Incomplete  Resp panel by RT-PCR (RSV, Flu A&B, Covid) Peripheral     Status: None   Collection Time: 12/03/23  8:47 AM   Specimen: Peripheral; Nasal Swab  Result Value Ref Range Status   SARS Coronavirus 2 by RT PCR NEGATIVE  NEGATIVE Final    Comment: (NOTE) SARS-CoV-2 target nucleic acids are NOT DETECTED.  The SARS-CoV-2 RNA is generally detectable in upper respiratory specimens during the acute phase of infection. The lowest concentration of SARS-CoV-2 viral copies this assay can detect is 138 copies/mL. A negative result does not preclude SARS-Cov-2 infection and should not be used as the sole basis for treatment or other patient management decisions. A negative result may occur with  improper specimen collection/handling, submission of specimen other than nasopharyngeal swab, presence of viral mutation(s) within the areas targeted by this assay, and inadequate number of viral copies(<138 copies/mL). A negative result must be combined with clinical observations, patient history, and epidemiological information. The expected result is Negative.  Fact Sheet for Patients:  bloggercourse.com  Fact Sheet for Healthcare Providers:  seriousbroker.it  This test is no t yet approved or cleared by the [place]  FDA and  has been authorized for detection and/or diagnosis of SARS-CoV-2 by FDA under an Emergency Use Authorization (EUA). This EUA will remain  in effect (meaning this test can be used) for the duration of the COVID-19 declaration under Section 564(b)(1) of the Act, 21 U.S.C.section 360bbb-3(b)(1), unless the authorization is terminated  or revoked sooner.       Influenza A by PCR NEGATIVE NEGATIVE Final   Influenza B by PCR NEGATIVE NEGATIVE Final    Comment: (NOTE) The Xpert Xpress SARS-CoV-2/FLU/RSV plus assay is intended as an aid in the diagnosis of influenza from Nasopharyngeal swab specimens and should not be used as a sole basis for treatment. Nasal washings and aspirates are unacceptable for Xpert Xpress SARS-CoV-2/FLU/RSV testing.  Fact Sheet for Patients: bloggercourse.com  Fact Sheet for Healthcare  Providers: seriousbroker.it  This test is not yet approved or cleared by the [place]  FDA and has been authorized for detection and/or diagnosis of SARS-CoV-2 by FDA under an Emergency Use Authorization (EUA). This EUA will remain in effect (meaning this test can be used) for the duration of the COVID-19 declaration under Section 564(b)(1) of the Act, 21 U.S.C. section 360bbb-3(b)(1), unless the authorization is terminated or revoked.     Resp Syncytial Virus by PCR NEGATIVE NEGATIVE Final    Comment: (NOTE) Fact Sheet for Patients: bloggercourse.com  Fact Sheet for Healthcare Providers: seriousbroker.it  This test is not yet approved or cleared by the [place]   FDA and has been authorized for detection and/or diagnosis of SARS-CoV-2 by FDA under an Emergency Use Authorization (EUA). This EUA will remain in effect (meaning this test can be used) for the duration of the COVID-19 declaration under Section 564(b)(1) of the Act, 21 U.S.C. section 360bbb-3(b)(1), unless the authorization is terminated or revoked.  Performed at [facility], [address], [place], [place] [address]   Blood culture (routine x 2)     Status: None (Preliminary result)   Collection Time: 12/03/23  9:00 AM   Specimen: BLOOD  Result Value Ref Range Status   Specimen Description   Final    BLOOD RIGHT ANTECUBITAL Performed at [facility], [address], [place], [place] [address]    Special Requests   Final    BOTTLES DRAWN AEROBIC AND ANAEROBIC Blood Culture adequate volume Performed at [facility], [address], [place], [place] [address]    Culture   Final    NO GROWTH 4 DAYS Performed at [facility], [address], [place], [place] [address]    Report Status PENDING  Incomplete    Please note: You were cared for by a hospitalist during  your hospital stay. Once you are discharged, your primary care physician will handle any further medical issues. Please note that NO REFILLS for any discharge medications will be authorized once you are discharged, as it is imperative that you return to your primary care physician (or establish a relationship with a primary care physician if you do not have one) for your post hospital discharge needs so that they can reassess your need for medications and monitor your lab values.    Time coordinating discharge: 40 minutes  SIGNED:   Ivonne Mustache, MD  [facility] 12/07/2023, 10:20 AM Pager [phone]  If 7PM-7AM, please contact night-coverage [email or web address] Password [email or web address]

## 2023-12-07 NOTE — TOC Transition Note (Signed)
 Transition of Care [facility]) - Discharge Note   Patient Details  Name: Melissa Cervantes MRN: [id number] Date of Birth: April 07, 1940  [facility] CM/SW Contact:  Sonda Manuella Quill, RN Phone Number: 12/07/2023, 10:35 AM   Clinical Narrative:    D/C orders received; HHPT/RN set up w/ [facility]; Cory at agency given notification of d/c; no IP CM needs.   Final next level of care: Home w Home Health Services Barriers to Discharge: No Barriers Identified   Patient Goals and CMS Choice Patient states their goals for this hospitalization and ongoing recovery are:: Home alone CMS Medicare.gov Compare Post Acute Care list provided to::  (NA) Choice offered to / list presented to : NA [place] ownership interest in [facility].provided to:: Parent NA    Discharge Placement                       Discharge Plan and Services Additional resources added to the After Visit Summary for   In-house Referral: NA Discharge Planning Services: Follow-up appt scheduled, CM Consult Post Acute Care Choice: NA          DME Arranged: N/A DME Agency: NA       HH Arranged: RN, PT HH Agency: [facility] Date [facility] Agency Contacted: 12/06/23 Time HH Agency Contacted: 1717 Representative spoke with at [facility] Agency: [facility]  Social Drivers of Health (SDOH) Interventions SDOH Screenings   Food Insecurity: No Food Insecurity (12/03/2023)  Housing: Low Risk  (12/03/2023)  Transportation Needs: No Transportation Needs (12/03/2023)  Utilities: At Risk (12/03/2023)  Alcohol Screen: Low Risk  (05/24/2022)  Depression (PHQ2-9): Low Risk  (05/24/2022)  Financial Resource Strain: Low Risk  (05/24/2022)  Physical Activity: Sufficiently Active (05/24/2022)  Social Connections: Socially Isolated (12/03/2023)  Stress: No Stress Concern Present (05/24/2022)  Tobacco Use: Medium Risk (12/03/2023)     Readmission Risk Interventions    12/04/2023   11:44 AM  Readmission Risk  Prevention Plan  Post Dischage Appt Complete  Medication Screening Complete  Transportation Screening Complete

## 2023-12-08 ENCOUNTER — Telehealth: Payer: Self-pay | Admitting: Adult Health

## 2023-12-08 ENCOUNTER — Telehealth: Payer: Self-pay

## 2023-12-08 DIAGNOSIS — F909 Attention-deficit hyperactivity disorder, unspecified type: Secondary | ICD-10-CM

## 2023-12-08 LAB — CULTURE, BLOOD (ROUTINE X 2)
Culture: NO GROWTH
Culture: NO GROWTH
Special Requests: ADEQUATE
Special Requests: ADEQUATE

## 2023-12-08 NOTE — Telephone Encounter (Unsigned)
 Copied from CRM #[id number]. Topic: Clinical - Medication Refill >> Dec 08, 2023  4:37 PM Shereese L wrote: Medication: amphetamine -dextroamphetamine  (ADDERALL) 15 MG tablet  Has the patient contacted their pharmacy? Yes [facility] [phone] [address] [place] [address]   (Agent: If yes, when and what did the pharmacy advise?)  This is the patient's preferred pharmacy: No Pharmacies Listed Is this the correct pharmacy for this prescription? Yes If no, delete pharmacy and type the correct one.   Has the prescription been filled recently? Yes  Is the patient out of the medication? Yes  Has the patient been seen for an appointment in the last year OR does the patient have an upcoming appointment? Yes  Can we respond through MyChart? Yes  Agent: Please be advised that Rx refills may take up to 3 business days. We ask that you follow-up with your pharmacy.

## 2023-12-08 NOTE — Telephone Encounter (Signed)
 Patient requesting medication that is pending for Adderall 15 mg. Please advise.

## 2023-12-08 NOTE — Transitions of Care (Post Inpatient/ED Visit) (Unsigned)
   12/08/2023  Name: Melissa Cervantes MRN: [id number] DOB: 14-Dec-1940  Today's TOC FU Call Status: Today's TOC FU Call Status:: Unsuccessful Call (1st Attempt) Unsuccessful Call (1st Attempt) Date: 12/08/23  Attempted to reach the patient regarding the most recent Inpatient/ED visit.  Follow Up Plan: Additional outreach attempts will be made to reach the patient to complete the Transitions of Care (Post Inpatient/ED visit) call.   Signature Julian Lemmings, LPN [facility] Nurse Health Advisor Direct Dial [phone]

## 2023-12-09 ENCOUNTER — Other Ambulatory Visit (HOSPITAL_COMMUNITY): Payer: Self-pay

## 2023-12-09 MED ORDER — AMPHETAMINE-DEXTROAMPHETAMINE 15 MG PO TABS
15.0000 mg | ORAL_TABLET | Freq: Two times a day (BID) | ORAL | 0 refills | Status: DC
  Filled 2023-12-09 – 2023-12-10 (×2): qty 60, 30d supply, fill #0

## 2023-12-09 NOTE — Transitions of Care (Post Inpatient/ED Visit) (Unsigned)
   12/09/2023  Name: Melissa Cervantes MRN: [id number] DOB: 04/23/1940  Today's TOC FU Call Status: Today's TOC FU Call Status:: Unsuccessful Call (2nd Attempt) Unsuccessful Call (1st Attempt) Date: 12/08/23 Unsuccessful Call (2nd Attempt) Date: 12/09/23  Attempted to reach the patient regarding the most recent Inpatient/ED visit.  Follow Up Plan: Additional outreach attempts will be made to reach the patient to complete the Transitions of Care (Post Inpatient/ED visit) call.   Signature Julian Lemmings, LPN [facility] Nurse Health Advisor Direct Dial [phone]

## 2023-12-09 NOTE — Telephone Encounter (Signed)
**Note De-identified  [place] Obfuscation** Please advise 

## 2023-12-10 ENCOUNTER — Inpatient Hospital Stay: Admitting: Adult Health

## 2023-12-10 ENCOUNTER — Other Ambulatory Visit: Payer: Self-pay

## 2023-12-10 ENCOUNTER — Other Ambulatory Visit (HOSPITAL_COMMUNITY): Payer: Self-pay

## 2023-12-10 NOTE — Transitions of Care (Post Inpatient/ED Visit) (Signed)
   12/10/2023  Name: Melissa Cervantes MRN: [id number] DOB: [birth year]  Today's TOC FU Call Status: Today's TOC FU Call Status:: Unsuccessful Call (3rd Attempt) Unsuccessful Call (1st Attempt) Date: 12/08/23 Unsuccessful Call (2nd Attempt) Date: 12/09/23 Unsuccessful Call (3rd Attempt) Date: 12/10/23  Attempted to reach the patient regarding the most recent Inpatient/ED visit.  Follow Up Plan: No further outreach attempts will be made at this time. We have been unable to contact the patient.  Signature Julian Lemmings, LPN [facility] Nurse Health Advisor Direct Dial [phone]

## 2023-12-10 NOTE — Telephone Encounter (Signed)
**Note De-identified  [place] Obfuscation** Please advise 

## 2023-12-12 ENCOUNTER — Ambulatory Visit: Admitting: Adult Health

## 2023-12-12 ENCOUNTER — Encounter: Payer: Self-pay | Admitting: Adult Health

## 2023-12-12 VITALS — BP 120/64 | HR 85 | Temp 97.5°F | Ht 61.0 in | Wt 117.0 lb

## 2023-12-12 DIAGNOSIS — F909 Attention-deficit hyperactivity disorder, unspecified type: Secondary | ICD-10-CM

## 2023-12-12 DIAGNOSIS — I10 Essential (primary) hypertension: Secondary | ICD-10-CM

## 2023-12-12 DIAGNOSIS — R5381 Other malaise: Secondary | ICD-10-CM

## 2023-12-12 DIAGNOSIS — L03116 Cellulitis of left lower limb: Secondary | ICD-10-CM | POA: Diagnosis not present

## 2023-12-12 DIAGNOSIS — E782 Mixed hyperlipidemia: Secondary | ICD-10-CM

## 2023-12-12 NOTE — Progress Notes (Signed)
 Subjective:    Patient ID: Melissa Cervantes, female    DOB: 1941-01-17, 83 y.o.   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  She presents to the office today for TCM visit  Admit Date 12/03/2023  Discharge Date 12/07/2023  She presented to the emergency room with the complaint of edema and redness of the left lower extremity.  She did report sustaining a wound about 2 weeks prior when a large item fell on her left lower extremity.  She was taking Keflex which did not help.  She denied fevers or chills. s  Hospital Course   Cellulitis of left lower extremity  -  Her lab work showed a white count of 9.5, left fibula x-ray did not show any acute fracture or dislocation but soft tissue edema.  She was started on Vanco and ceftriaxone.  - CT of the left lower extremity done on 12/04/2023 showed mildly complex subcutaneous fluid collection along the anterolateral distal calf measuring 8.3 x 3.7 1.27 thought to be likely hematoma less likely abscess.  Bedside aspiration was done which returned thick dark-colored blood no abscess.  She was placed on oral Augmentin and Doxycycline  - She has an appointment with outpatient wound care on 12/23/2023 - PT recommended home health  ADHD - Continue Adderall   Hyperlipidemia - Continue simvastatin  10 mg   Hypertension  - Continue lisinopril  2.5 mg daily   Deconditioning  - Home health PT   Today she reports that she is doing well since being discharged. She feels as though her wound is healing. She has not had any fevers, chills, or noticed any streaking to her left leg. She is taking her antibiotics as directed     Review of Systems  Constitutional: Negative.   HENT: Negative.    Eyes: Negative.   Respiratory: Negative.    Cardiovascular: Negative.   Gastrointestinal: Negative.   Endocrine: Negative.   Genitourinary: Negative.   Musculoskeletal: Negative.   Skin:  Positive for wound.  Allergic/Immunologic: Negative.   Neurological: Negative.   Hematological: Negative.    Past Medical History:  Diagnosis Date   Achalasia    ADHD    Allergy    Anxiety    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   Cervical dystonia    Chicken pox    Esophageal obstruction    per patient she is unable to eat solid food   Hearing loss    Hyperlipidemia 12/03/2023   Hypertension    Meniere disease    Neuromuscular disorder (HCC)    toricollis  gets Botox injections q 3-34 months   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   Osteoarthritis    Spinal stenosis     Social History   Socioeconomic History   Marital status: Divorced    Spouse name: Not on file   Number of children: Not on file   Years of education: Not on file   Highest education level: Master's degree (e.g., MA, MS, MEng, MEd, MSW, MBA)  Occupational History   Not on file  Tobacco Use   Smoking status: Former   Smokeless tobacco: Never  Vaping Use   Vaping status: Never Used  Substance and Sexual Activity   Alcohol use: Yes    Comment: 4  Drug use: Not Currently    Types: Marijuana   Sexual activity: Not Currently  Other Topics Concern   Not on file  Social History Narrative   Not on file   Social Drivers of Health   Financial Resource Strain: Low Risk  (05/24/2022)   Overall Financial Resource Strain (CARDIA)    Difficulty of Paying Living Expenses: Not hard at all  Food Insecurity: No Food Insecurity (12/03/2023)   Hunger Vital Sign    Worried About Running Out of Food in the Last Year: Never true    Ran Out of Food in the Last Year: Never true  Transportation Needs: No Transportation Needs (12/03/2023)   PRAPARE -  Administrator, Civil Service (Medical): No    Lack of Transportation (Non-Medical): No  Physical Activity: Sufficiently Active (05/24/2022)   Exercise Vital Sign    Days of Exercise per Week: 4 days    Minutes of Exercise per Session: 60 min  Stress: No Stress Concern Present (05/24/2022)   [employer] of Occupational Health - Occupational Stress Questionnaire    Feeling of Stress : Not at all  Social Connections: Socially Isolated (12/03/2023)   Social Connection and Isolation Panel    Frequency of Communication with Friends and Family: More than three times a week    Frequency of Social Gatherings with Friends and Family: More than three times a week    Attends Religious Services: Never    Database Administrator or Organizations: No    Attends Banker Meetings: Never    Marital Status: Divorced  Catering Manager Violence: Not At Risk (12/03/2023)   Humiliation, Afraid, Rape, and Kick questionnaire    Fear of Current or Ex-Partner: No    Emotionally Abused: No    Physically Abused: No    Sexually Abused: No    Past Surgical History:  Procedure Laterality Date   BALLOON DILATION N/A 01/30/2021   Procedure: BALLOON DILATION;  Surgeon: Shila Gustav GAILS, MD;  Location: [facility];  Service: Endoscopy;  Laterality: N/A;   BRAIN SURGERY  07/02/2023   Right frontal VP shunt   CESAREAN SECTION  1971, 1979   CYST EXCISION     Pilonidal cyst removed   ESOPHAGEAL BRUSHING  01/30/2021   Procedure: ESOPHAGEAL BRUSHING;  Surgeon: Shila Gustav GAILS, MD;  Location: [facility];  Service: Endoscopy;;   ESOPHAGOGASTRODUODENOSCOPY (EGD) WITH PROPOFOL  N/A 01/30/2021   Procedure: ESOPHAGOGASTRODUODENOSCOPY (EGD) WITH PROPOFOL ;  Surgeon: Shila Gustav GAILS, MD;  Location: [facility];  Service: Endoscopy;  Laterality: N/A;   EYE SURGERY Bilateral    cataract removal   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   NISSEN  FUNDOPLICATION  2007   TOTAL HIP ARTHROPLASTY Left 06/2012   TOTAL HIP ARTHROPLASTY Right 09/09/2023   Procedure: ARTHROPLASTY, HIP, TOTAL, ANTERIOR APPROACH;  Surgeon: Ernie Cough, MD;  Location: [facility];  Service: Orthopedics;  Laterality: Right;    Family History  Problem Relation Age of Onset   Heart disease Mother    Heart disease Father    Alcohol abuse Father    Dystonia Sister    Dystonia Brother    Colon cancer Neg Hx    Esophageal cancer Neg Hx    Stomach cancer Neg Hx     Allergies  Allergen Reactions   Latex Dermatitis   Lipitor [Atorvastatin ] Other (See Comments)    Fatigue and muscle aches     Current Outpatient Medications on File  Prior to Visit  Medication Sig Dispense Refill   albuterol  (VENTOLIN  HFA) 108 (90 Base) MCG/ACT inhaler INHALE 1-2 PUFFS BY MOUTH EVERY 6 HOURS AS NEEDED FOR WHEEZE OR SHORTNESS OF BREATH 8.5 each 3   amoxicillin-clavulanate (AUGMENTIN) 875-125 MG tablet Take 1 tablet by mouth every 12 (twelve) hours for 7 days. 14 tablet 0   amphetamine -dextroamphetamine  (ADDERALL) 15 MG tablet Take 1 tablet by mouth 2 (two) times daily. 60 tablet 0   clonazePAM  (KLONOPIN ) 0.5 MG tablet Take 1 tablet (0.5 mg total) by mouth 2 (two) times a day as needed (dystonic spasms). (Patient taking differently: Take 0.5 mg by mouth at bedtime.) 60 tablet 3   collagenase (SANTYL) 250 UNIT/GM ointment Apply topically daily. 15 g 0   doxycycline  (VIBRA -TABS) 100 MG tablet Take 1 tablet (100 mg total) by mouth every 12 (twelve) hours for 7 days. 14 tablet 0   lisinopril  (ZESTRIL ) 2.5 MG tablet Take 1 tablet (2.5 mg total) by mouth every evening. 90 tablet 0   Multiple Vitamin (MULTIVITAMIN WITH MINERALS) TABS tablet Take 1 tablet by mouth in the morning. Vita Fusion Multivitamin     simvastatin  (ZOCOR ) 10 MG tablet Take 1 tablet (10 mg total) by mouth at bedtime. 90 tablet 0   polyethylene glycol powder (GLYCOLAX /MIRALAX ) 17 GM/SCOOP powder Take 17 g by mouth daily.  Dissolve 1 capful (17g) in 4-8 ounces of liquid and take by mouth daily. 238 g 0   No current facility-administered medications on file prior to visit.    BP 120/64   Pulse 85   Temp (!) 97.5 F (36.4 C) (Oral)   Ht 5' 1 (1.549 m)   Wt 117 lb (53.1 kg)   SpO2 98%   BMI 22.11 kg/m       Objective:   Physical Exam Vitals and nursing note reviewed.  Constitutional:      Appearance: Normal appearance.  Cardiovascular:     Rate and Rhythm: Normal rate and regular rhythm.     Pulses: Normal pulses.     Heart sounds: Normal heart sounds.  Pulmonary:     Effort: Pulmonary effort is normal.     Breath sounds: Normal breath sounds.  Skin:    General: Skin is warm and dry.     Comments: Well healing wound on left shin. Black eschar tissue noted. Scant drainage on bandage. Erythema seems to be resolving    Neurological:     General: No focal deficit present.     Mental Status: She is alert and oriented to person, place, and time.  Psychiatric:        Mood and Affect: Mood normal.        Behavior: Behavior normal.        Thought Content: Thought content normal.        Judgment: Judgment normal.        Assessment & Plan:   1. Cellulitis of left lower extremity (Primary) - Reviewed hospital notes, discharge instructions, labs, imaging and medication changes. All questions answered to the best of my ability. Cellulitis seems to be resolving and wound appears to be healing well.  - Follow up with wound care but I do not think she will need to see them for very long  - Take abx as directed until finished - Follow up for worsening signs of infection   2. Attention deficit hyperactivity disorder (ADHD), unspecified ADHD type - Continue with Adderall   3. Mixed hyperlipidemia - Continue with Simvastatin   4. Primary hypertension - Continue with lisinopril    5. Physical deconditioning  Work with HH PT   Darleene Shape, NP

## 2023-12-14 ENCOUNTER — Other Ambulatory Visit (HOSPITAL_COMMUNITY): Payer: Self-pay

## 2023-12-15 ENCOUNTER — Other Ambulatory Visit (HOSPITAL_COMMUNITY): Payer: Self-pay

## 2023-12-16 ENCOUNTER — Other Ambulatory Visit: Payer: Self-pay | Admitting: Adult Health

## 2023-12-17 ENCOUNTER — Other Ambulatory Visit (HOSPITAL_BASED_OUTPATIENT_CLINIC_OR_DEPARTMENT_OTHER): Payer: Self-pay

## 2023-12-17 MED ORDER — SIMVASTATIN 10 MG PO TABS
10.0000 mg | ORAL_TABLET | Freq: Every day | ORAL | 0 refills | Status: DC
  Filled 2023-12-17: qty 90, 90d supply, fill #0

## 2023-12-18 ENCOUNTER — Other Ambulatory Visit (HOSPITAL_BASED_OUTPATIENT_CLINIC_OR_DEPARTMENT_OTHER): Payer: Self-pay

## 2023-12-23 ENCOUNTER — Encounter (HOSPITAL_BASED_OUTPATIENT_CLINIC_OR_DEPARTMENT_OTHER): Attending: General Surgery | Admitting: General Surgery

## 2023-12-23 ENCOUNTER — Other Ambulatory Visit (HOSPITAL_BASED_OUTPATIENT_CLINIC_OR_DEPARTMENT_OTHER): Payer: Self-pay

## 2023-12-23 ENCOUNTER — Other Ambulatory Visit (HOSPITAL_COMMUNITY): Payer: Self-pay

## 2023-12-23 DIAGNOSIS — L97829 Non-pressure chronic ulcer of other part of left lower leg with unspecified severity: Secondary | ICD-10-CM | POA: Insufficient documentation

## 2023-12-25 ENCOUNTER — Other Ambulatory Visit: Payer: Self-pay | Admitting: Adult Health

## 2023-12-25 ENCOUNTER — Other Ambulatory Visit (HOSPITAL_BASED_OUTPATIENT_CLINIC_OR_DEPARTMENT_OTHER): Payer: Self-pay

## 2023-12-25 MED ORDER — SIMVASTATIN 10 MG PO TABS
10.0000 mg | ORAL_TABLET | Freq: Every day | ORAL | 0 refills | Status: AC
  Filled 2023-12-25: qty 90, 90d supply, fill #0

## 2023-12-26 ENCOUNTER — Encounter: Payer: Self-pay | Admitting: Adult Health

## 2024-01-06 ENCOUNTER — Other Ambulatory Visit (HOSPITAL_BASED_OUTPATIENT_CLINIC_OR_DEPARTMENT_OTHER): Payer: Self-pay

## 2024-01-06 ENCOUNTER — Encounter (HOSPITAL_BASED_OUTPATIENT_CLINIC_OR_DEPARTMENT_OTHER): Attending: General Surgery | Admitting: General Surgery

## 2024-01-06 DIAGNOSIS — L97829 Non-pressure chronic ulcer of other part of left lower leg with unspecified severity: Secondary | ICD-10-CM | POA: Insufficient documentation

## 2024-01-13 ENCOUNTER — Other Ambulatory Visit: Payer: Self-pay | Admitting: Adult Health

## 2024-01-13 ENCOUNTER — Other Ambulatory Visit (HOSPITAL_COMMUNITY): Payer: Self-pay

## 2024-01-13 ENCOUNTER — Other Ambulatory Visit: Payer: Self-pay

## 2024-01-13 DIAGNOSIS — F909 Attention-deficit hyperactivity disorder, unspecified type: Secondary | ICD-10-CM

## 2024-01-13 MED ORDER — AMPHETAMINE-DEXTROAMPHETAMINE 15 MG PO TABS
15.0000 mg | ORAL_TABLET | Freq: Two times a day (BID) | ORAL | 0 refills | Status: DC
  Filled 2024-01-13: qty 60, 30d supply, fill #0

## 2024-01-19 ENCOUNTER — Encounter (HOSPITAL_BASED_OUTPATIENT_CLINIC_OR_DEPARTMENT_OTHER): Admitting: Internal Medicine

## 2024-01-19 DIAGNOSIS — L97829 Non-pressure chronic ulcer of other part of left lower leg with unspecified severity: Secondary | ICD-10-CM | POA: Diagnosis not present

## 2024-01-20 ENCOUNTER — Encounter (HOSPITAL_BASED_OUTPATIENT_CLINIC_OR_DEPARTMENT_OTHER): Admitting: General Surgery

## 2024-01-20 ENCOUNTER — Other Ambulatory Visit: Payer: Self-pay

## 2024-01-20 ENCOUNTER — Other Ambulatory Visit (HOSPITAL_COMMUNITY): Payer: Self-pay

## 2024-01-20 MED ORDER — CLONAZEPAM 0.5 MG PO TABS
0.5000 mg | ORAL_TABLET | Freq: Every day | ORAL | 3 refills | Status: AC | PRN
  Filled 2024-01-20: qty 90, 90d supply, fill #0

## 2024-01-21 ENCOUNTER — Encounter: Payer: Self-pay | Admitting: Adult Health

## 2024-01-21 ENCOUNTER — Other Ambulatory Visit: Payer: Self-pay

## 2024-01-21 ENCOUNTER — Other Ambulatory Visit (HOSPITAL_COMMUNITY): Payer: Self-pay

## 2024-01-21 ENCOUNTER — Ambulatory Visit: Admitting: Adult Health

## 2024-01-21 VITALS — BP 110/60 | HR 88 | Temp 97.8°F | Ht 61.0 in | Wt 115.0 lb

## 2024-01-21 DIAGNOSIS — I10 Essential (primary) hypertension: Secondary | ICD-10-CM | POA: Diagnosis not present

## 2024-01-21 DIAGNOSIS — J4520 Mild intermittent asthma, uncomplicated: Secondary | ICD-10-CM

## 2024-01-21 DIAGNOSIS — K220 Achalasia of cardia: Secondary | ICD-10-CM

## 2024-01-21 DIAGNOSIS — H8102 Meniere's disease, left ear: Secondary | ICD-10-CM

## 2024-01-21 DIAGNOSIS — G243 Spasmodic torticollis: Secondary | ICD-10-CM

## 2024-01-21 DIAGNOSIS — F909 Attention-deficit hyperactivity disorder, unspecified type: Secondary | ICD-10-CM | POA: Diagnosis not present

## 2024-01-21 DIAGNOSIS — E782 Mixed hyperlipidemia: Secondary | ICD-10-CM | POA: Diagnosis not present

## 2024-01-21 MED ORDER — AMPHETAMINE-DEXTROAMPHETAMINE 15 MG PO TABS
15.0000 mg | ORAL_TABLET | Freq: Two times a day (BID) | ORAL | 0 refills | Status: AC
  Filled 2024-01-21 – 2024-02-16 (×3): qty 60, 30d supply, fill #0

## 2024-01-21 MED ORDER — AMPHETAMINE-DEXTROAMPHETAMINE 15 MG PO TABS
15.0000 mg | ORAL_TABLET | Freq: Two times a day (BID) | ORAL | 0 refills | Status: AC
  Filled 2024-01-21: qty 60, 30d supply, fill #0

## 2024-01-21 NOTE — Progress Notes (Signed)
 Subjective:    Patient ID: Melissa Cervantes, female    DOB: 04-10-1940, 83 y.o.   MRN: [id number]  HPI Patient presents for yearly preventative medicine examination. She is a pleasant 83 year old female who  has a past medical history of Achalasia, ADHD, Allergy, Anxiety, Ataxic gait (12/09/2022), Cervical dystonia, Chicken pox, Esophageal obstruction, Hearing loss, Hyperlipidemia (12/03/2023), Hypertension, Meniere disease, Neuromuscular disorder (HCC), Normal pressure hydrocephalus (HCC) (01/24/2023), Osteoarthritis, and Spinal stenosis.  ADHD - managed with Adderall 15 mg BID. She does fell well controlled on this medication   Hyperlipidemia - Currently managed with Simvastatin  10 mg at bedtime. She does tolerate this medication well.  Lab Results  Component Value Date   CHOL 176 06/20/2022   HDL 68.90 06/20/2022   LDLCALC 93 06/20/2022   TRIG 71.0 06/20/2022   CHOLHDL 3 06/20/2022   Hypertension - she is currently prescribed lisinopril  2.5 mg daily. She does have a home health aid that comes by and checks her blood pressure at home with readings mostly in the 1teens systolic. On occasion she will have readings into the 190's but is asymptomatic  BP Readings from Last 3 Encounters:  01/21/24 110/60  12/12/23 120/64  12/07/23 138/63    Menieres Disease - diagnosed in 2006. She has significant hearing loss in the left ear and tinnitus that is persistent. She has hearing aids bilaterally and uses a noice machine. She reports that recently she has had some worsening hearing loss in her right ear.   Cervical Dystonia - She is managed by  Neurology at [facility] and receives botox injections ever 3-5 months. She is also prescribed klonopin  0.5 mg QHS as an antispasmodic. She does not exercises for balance and range of motion daily.   Achalasia - has had multiple esophageal dilations as well as laparoscopic Heller's myotomy with full fundoplication. She is seen by GI on a routine  basis   Asthma - mild and intermittent, she is seen by pulmonary routinely. Uses Albuterol  inhaler PRN    All immunizations and health maintenance protocols were reviewed with the patient and needed orders were placed.  Appropriate screening laboratory values were ordered for the patient including screening of hyperlipidemia, renal function and hepatic function.  Medication reconciliation,  past medical history, social history, problem list and allergies were reviewed in detail with the patient  Goals were established with regard to weight loss, exercise, and  diet in compliance with medications Wt Readings from Last 3 Encounters:  01/21/24 115 lb (52.2 kg)  12/12/23 117 lb (53.1 kg)  12/03/23 115 lb 11.9 oz (52.5 kg)   Review of Systems  Constitutional: Negative.   HENT:  Positive for hearing loss and tinnitus.   Eyes: Negative.   Respiratory: Negative.    Cardiovascular: Negative.   Gastrointestinal: Negative.   Endocrine: Negative.   Genitourinary: Negative.   Musculoskeletal:  Positive for arthralgias, back pain, gait problem, neck pain and neck stiffness.  Skin: Negative.   Allergic/Immunologic: Negative.   Hematological: Negative.   Psychiatric/Behavioral: Negative.     Past Medical History:  Diagnosis Date   Achalasia    ADHD    Allergy    Anxiety    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   Cervical dystonia    Chicken pox    Esophageal obstruction    per patient she is unable to eat solid food   Hearing loss    Hyperlipidemia 12/03/2023   Hypertension    Meniere disease    Neuromuscular  disorder (HCC)    toricollis  gets Botox injections q 3-34 months   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   Osteoarthritis    Spinal stenosis     Social History   Socioeconomic History   Marital status: Divorced    Spouse name: Not on file   Number of children: Not on file   Years of education: Not on file   Highest education level: Master's degree (e.g., MA, MS, MEng,  MEd, MSW, MBA)  Occupational History   Not on file  Tobacco Use   Smoking status: Former   Smokeless tobacco: Never  Vaping Use   Vaping status: Never Used  Substance and Sexual Activity   Alcohol use: Yes    Comment: 4   Drug use: Not Currently    Types: Marijuana   Sexual activity: Not Currently  Other Topics Concern   Not on file  Social History Narrative   Not on file   Social Drivers of Health   Tobacco Use: Medium Risk (01/21/2024)   Patient History    Smoking Tobacco Use: Former    Smokeless Tobacco Use: Never    Passive Exposure: Not on Actuary Strain: Low Risk (05/24/2022)   Overall Financial Resource Strain (CARDIA)    Difficulty of Paying Living Expenses: Not hard at all  Food Insecurity: No Food Insecurity (12/03/2023)   Epic    Worried About Radiation Protection Practitioner of Food in the Last Year: Never true    Ran Out of Food in the Last Year: Never true  Transportation Needs: No Transportation Needs (12/03/2023)   Epic    Lack of Transportation (Medical): No    Lack of Transportation (Non-Medical): No  Physical Activity: Sufficiently Active (05/24/2022)   Exercise Vital Sign    Days of Exercise per Week: 4 days    Minutes of Exercise per Session: 60 min  Stress: No Stress Concern Present (05/24/2022)   [employer] of Occupational Health - Occupational Stress Questionnaire    Feeling of Stress : Not at all  Social Connections: Socially Isolated (12/03/2023)   Social Connection and Isolation Panel    Frequency of Communication with Friends and Family: More than three times a week    Frequency of Social Gatherings with Friends and Family: More than three times a week    Attends Religious Services: Never    Database Administrator or Organizations: No    Attends Banker Meetings: Never    Marital Status: Divorced  Catering Manager Violence: Not At Risk (12/03/2023)   Epic    Fear of Current or Ex-Partner: No    Emotionally Abused: No     Physically Abused: No    Sexually Abused: No  Depression (PHQ2-9): Low Risk (01/21/2024)   Depression (PHQ2-9)    PHQ-2 Score: 2  Alcohol Screen: Low Risk (05/24/2022)   Alcohol Screen    Last Alcohol Screening Score (AUDIT): 4  Housing: Low Risk (12/03/2023)   Epic    Unable to Pay for Housing in the Last Year: No    Number of Times Moved in the Last Year: 0    Homeless in the Last Year: No  Utilities: At Risk (12/03/2023)   Epic    Threatened with loss of utilities: Yes  Health Literacy: Not on file    Past Surgical History:  Procedure Laterality Date   BALLOON DILATION N/A 01/30/2021   Procedure: BALLOON DILATION;  Surgeon: Shila Gustav GAILS, MD;  Location: [facility];  Service:  Endoscopy;  Laterality: N/A;   BRAIN SURGERY  07/02/2023   Right frontal VP shunt   CESAREAN SECTION  1971, 1979   CYST EXCISION     Pilonidal cyst removed   ESOPHAGEAL BRUSHING  01/30/2021   Procedure: ESOPHAGEAL BRUSHING;  Surgeon: Shila Gustav GAILS, MD;  Location: [facility];  Service: Endoscopy;;   ESOPHAGOGASTRODUODENOSCOPY (EGD) WITH PROPOFOL  N/A 01/30/2021   Procedure: ESOPHAGOGASTRODUODENOSCOPY (EGD) WITH PROPOFOL ;  Surgeon: Shila Gustav GAILS, MD;  Location: [facility];  Service: Endoscopy;  Laterality: N/A;   EYE SURGERY Bilateral    cataract removal   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   NISSEN FUNDOPLICATION  2007   TOTAL HIP ARTHROPLASTY Left 06/2012   TOTAL HIP ARTHROPLASTY Right 09/09/2023   Procedure: ARTHROPLASTY, HIP, TOTAL, ANTERIOR APPROACH;  Surgeon: Ernie Cough, MD;  Location: [facility];  Service: Orthopedics;  Laterality: Right;    Family History  Problem Relation Age of Onset   Heart disease Mother    Heart disease Father    Alcohol abuse Father    Dystonia Sister    Dystonia Brother    Colon cancer Neg Hx    Esophageal cancer Neg Hx    Stomach cancer Neg Hx     Allergies[1]  Medications Ordered Prior to  Encounter[2]  BP 110/60   Pulse 88   Temp 97.8 F (36.6 C) (Oral)   Ht 5' 1 (1.549 m)   Wt 115 lb (52.2 kg)   SpO2 98%   BMI 21.73 kg/m       Objective:   Physical Exam Vitals and nursing note reviewed.  Constitutional:      General: She is not in acute distress.    Appearance: Normal appearance. She is not ill-appearing.  HENT:     Head: Normocephalic and atraumatic.     Right Ear: Tympanic membrane, ear canal and external ear normal. There is no impacted cerumen.     Left Ear: Tympanic membrane, ear canal and external ear normal. There is no impacted cerumen.     Nose: Nose normal. No congestion or rhinorrhea.     Mouth/Throat:     Mouth: Mucous membranes are moist.     Pharynx: Oropharynx is clear.  Eyes:     Extraocular Movements: Extraocular movements intact.     Conjunctiva/sclera: Conjunctivae normal.     Pupils: Pupils are equal, round, and reactive to light.  Neck:     Vascular: No carotid bruit.  Cardiovascular:     Rate and Rhythm: Normal rate and regular rhythm.     Pulses: Normal pulses.     Heart sounds: No murmur heard.    No friction rub. No gallop.  Pulmonary:     Effort: Pulmonary effort is normal.     Breath sounds: Normal breath sounds.  Abdominal:     General: Abdomen is flat. Bowel sounds are normal. There is no distension.     Palpations: Abdomen is soft. There is no mass.     Tenderness: There is no abdominal tenderness. There is no guarding or rebound.     Hernia: No hernia is present.  Musculoskeletal:     Cervical back: Rigidity present. Pain with movement present. Decreased range of motion.  Lymphadenopathy:     Cervical: No cervical adenopathy.  Skin:    General: Skin is warm and dry.     Capillary Refill: Capillary refill takes less than 2 seconds.  Neurological:     General: No  focal deficit present.     Mental Status: She is alert and oriented to person, place, and time.  Psychiatric:        Mood and Affect: Mood normal.         Behavior: Behavior normal.        Thought Content: Thought content normal.        Judgment: Judgment normal.       Assessment & Plan:  1. Attention deficit hyperactivity disorder (ADHD), unspecified ADHD type (Primary) - Continue with Adderall 5 mg BID.   2. Mixed hyperlipidemia - Consider increasing statin dose - CBC with Differential/Platelet; Future - Comprehensive metabolic panel with GFR; Future - Lipid panel; Future - TSH; Future  3. Primary hypertension - Controlled. No change in therapy - CBC with Differential/Platelet; Future - Comprehensive metabolic panel with GFR; Future - Lipid panel; Future - TSH; Future  4. Meniere's disease of left ear  - CBC with Differential/Platelet; Future - Comprehensive metabolic panel with GFR; Future - Lipid panel; Future - TSH; Future  5. Cervical dystonia - per Neurology  - CBC with Differential/Platelet; Future - Comprehensive metabolic panel with GFR; Future - Lipid panel; Future - TSH; Future  6. Achalasia - Per GI  - CBC with Differential/Platelet; Future - Comprehensive metabolic panel with GFR; Future - Lipid panel; Future - TSH; Future  7. Mild intermittent asthma without complication - Continue with albuterol  PRN  - CBC with Differential/Platelet; Future - Comprehensive metabolic panel with GFR; Future - Lipid panel; Future - TSH; Future  Darleene Shape, NP     [1]  Allergies Allergen Reactions   Latex Dermatitis   Lipitor [Atorvastatin ] Other (See Comments)    Fatigue and muscle aches   [2]  Current Outpatient Medications on File Prior to Visit  Medication Sig Dispense Refill   albuterol  (VENTOLIN  HFA) 108 (90 Base) MCG/ACT inhaler INHALE 1-2 PUFFS BY MOUTH EVERY 6 HOURS AS NEEDED FOR WHEEZE OR SHORTNESS OF BREATH 8.5 each 3   amphetamine -dextroamphetamine  (ADDERALL) 15 MG tablet Take 1 tablet by mouth 2 (two) times daily. 60 tablet 0   clonazePAM  (KLONOPIN ) 0.5 MG tablet Take 1 tablet (0.5 mg  total) by mouth daily as needed (dystonic spasms, sleep). 90 tablet 3   lisinopril  (ZESTRIL ) 2.5 MG tablet Take 1 tablet (2.5 mg total) by mouth every evening. 90 tablet 0   Multiple Vitamin (MULTIVITAMIN WITH MINERALS) TABS tablet Take 1 tablet by mouth in the morning. Vita Fusion Multivitamin     simvastatin  (ZOCOR ) 10 MG tablet Take 1 tablet (10 mg total) by mouth at bedtime. 90 tablet 0   collagenase  (SANTYL ) 250 UNIT/GM ointment Apply topically daily. 15 g 0   No current facility-administered medications on file prior to visit.

## 2024-01-21 NOTE — Patient Instructions (Addendum)
 It was great seeing you today   We will follow up with you regarding your lab work   Please let me know if you need anything   [facility] [address], [place] [id number]  [phone] [email or web address]

## 2024-01-27 ENCOUNTER — Encounter (HOSPITAL_BASED_OUTPATIENT_CLINIC_OR_DEPARTMENT_OTHER): Admitting: General Surgery

## 2024-01-27 DIAGNOSIS — L97829 Non-pressure chronic ulcer of other part of left lower leg with unspecified severity: Secondary | ICD-10-CM | POA: Diagnosis not present

## 2024-02-03 ENCOUNTER — Encounter (HOSPITAL_BASED_OUTPATIENT_CLINIC_OR_DEPARTMENT_OTHER): Admitting: General Surgery

## 2024-02-04 ENCOUNTER — Other Ambulatory Visit (INDEPENDENT_AMBULATORY_CARE_PROVIDER_SITE_OTHER)

## 2024-02-04 DIAGNOSIS — I10 Essential (primary) hypertension: Secondary | ICD-10-CM

## 2024-02-04 DIAGNOSIS — K220 Achalasia of cardia: Secondary | ICD-10-CM | POA: Diagnosis not present

## 2024-02-04 DIAGNOSIS — H8102 Meniere's disease, left ear: Secondary | ICD-10-CM | POA: Diagnosis not present

## 2024-02-04 DIAGNOSIS — J4520 Mild intermittent asthma, uncomplicated: Secondary | ICD-10-CM

## 2024-02-04 DIAGNOSIS — E782 Mixed hyperlipidemia: Secondary | ICD-10-CM

## 2024-02-04 DIAGNOSIS — G243 Spasmodic torticollis: Secondary | ICD-10-CM

## 2024-02-04 LAB — LIPID PANEL
Cholesterol: 172 mg/dL (ref 28–200)
HDL: 70.3 mg/dL
LDL Cholesterol: 89 mg/dL (ref 10–99)
NonHDL: 101.41
Total CHOL/HDL Ratio: 2
Triglycerides: 62 mg/dL (ref 10.0–149.0)
VLDL: 12.4 mg/dL (ref 0.0–40.0)

## 2024-02-04 LAB — COMPREHENSIVE METABOLIC PANEL WITH GFR
ALT: 17 U/L (ref 3–35)
AST: 20 U/L (ref 5–37)
Albumin: 4.3 g/dL (ref 3.5–5.2)
Alkaline Phosphatase: 97 U/L (ref 39–117)
BUN: 24 mg/dL — ABNORMAL HIGH (ref 6–23)
CO2: 27 meq/L (ref 19–32)
Calcium: 9.6 mg/dL (ref 8.4–10.5)
Chloride: 105 meq/L (ref 96–112)
Creatinine, Ser: 0.61 mg/dL (ref 0.40–1.20)
GFR: 83.19 mL/min
Glucose, Bld: 95 mg/dL (ref 70–99)
Potassium: 4.6 meq/L (ref 3.5–5.1)
Sodium: 139 meq/L (ref 135–145)
Total Bilirubin: 0.5 mg/dL (ref 0.2–1.2)
Total Protein: 6.9 g/dL (ref 6.0–8.3)

## 2024-02-04 LAB — TSH: TSH: 4.02 u[IU]/mL (ref 0.35–5.50)

## 2024-02-04 LAB — CBC WITH DIFFERENTIAL/PLATELET
Basophils Absolute: 0.1 K/uL (ref 0.0–0.1)
Basophils Relative: 1.3 % (ref 0.0–3.0)
Eosinophils Absolute: 0.5 K/uL (ref 0.0–0.7)
Eosinophils Relative: 8.4 % — ABNORMAL HIGH (ref 0.0–5.0)
HCT: 38.4 % (ref 36.0–46.0)
Hemoglobin: 12.9 g/dL (ref 12.0–15.0)
Lymphocytes Relative: 30.1 % (ref 12.0–46.0)
Lymphs Abs: 1.8 K/uL (ref 0.7–4.0)
MCHC: 33.5 g/dL (ref 30.0–36.0)
MCV: 92.5 fl (ref 78.0–100.0)
Monocytes Absolute: 0.7 K/uL (ref 0.1–1.0)
Monocytes Relative: 12.1 % — ABNORMAL HIGH (ref 3.0–12.0)
Neutro Abs: 2.9 K/uL (ref 1.4–7.7)
Neutrophils Relative %: 48.1 % (ref 43.0–77.0)
Platelets: 322 K/uL (ref 150.0–400.0)
RBC: 4.16 Mil/uL (ref 3.87–5.11)
RDW: 13.5 % (ref 11.5–15.5)
WBC: 6.1 K/uL (ref 4.0–10.5)

## 2024-02-06 ENCOUNTER — Ambulatory Visit: Payer: Self-pay | Admitting: Adult Health

## 2024-02-10 ENCOUNTER — Encounter (HOSPITAL_BASED_OUTPATIENT_CLINIC_OR_DEPARTMENT_OTHER): Admitting: General Surgery

## 2024-02-11 ENCOUNTER — Encounter: Payer: Self-pay | Admitting: Adult Health

## 2024-02-11 ENCOUNTER — Encounter: Admitting: Adult Health

## 2024-02-11 NOTE — Progress Notes (Signed)
" ° °  Subjective:    Patient ID: Melissa Cervantes, female    DOB: November 07, 1940, 84 y.o.   MRN: [id number]  HPI    Review of Systems     Objective:   Physical Exam        Assessment & Plan:    "

## 2024-02-13 ENCOUNTER — Encounter (HOSPITAL_BASED_OUTPATIENT_CLINIC_OR_DEPARTMENT_OTHER): Attending: General Surgery | Admitting: General Surgery

## 2024-02-13 DIAGNOSIS — L97829 Non-pressure chronic ulcer of other part of left lower leg with unspecified severity: Secondary | ICD-10-CM | POA: Diagnosis present

## 2024-02-16 ENCOUNTER — Other Ambulatory Visit (HOSPITAL_COMMUNITY): Payer: Self-pay

## 2024-02-16 ENCOUNTER — Other Ambulatory Visit (HOSPITAL_BASED_OUTPATIENT_CLINIC_OR_DEPARTMENT_OTHER): Payer: Self-pay

## 2024-02-17 ENCOUNTER — Other Ambulatory Visit (HOSPITAL_BASED_OUTPATIENT_CLINIC_OR_DEPARTMENT_OTHER): Payer: Self-pay

## 2024-02-23 ENCOUNTER — Encounter (HOSPITAL_BASED_OUTPATIENT_CLINIC_OR_DEPARTMENT_OTHER): Admitting: General Surgery

## 2024-02-23 DIAGNOSIS — L97829 Non-pressure chronic ulcer of other part of left lower leg with unspecified severity: Secondary | ICD-10-CM | POA: Diagnosis not present

## 2024-03-07 ENCOUNTER — Other Ambulatory Visit: Payer: Self-pay

## 2024-03-09 ENCOUNTER — Other Ambulatory Visit: Payer: Self-pay | Admitting: Adult Health

## 2024-03-09 DIAGNOSIS — I10 Essential (primary) hypertension: Secondary | ICD-10-CM

## 2024-03-10 ENCOUNTER — Other Ambulatory Visit (HOSPITAL_BASED_OUTPATIENT_CLINIC_OR_DEPARTMENT_OTHER): Payer: Self-pay

## 2024-03-10 MED ORDER — LISINOPRIL 2.5 MG PO TABS
2.5000 mg | ORAL_TABLET | Freq: Every evening | ORAL | 0 refills | Status: AC
  Filled 2024-03-10: qty 90, 90d supply, fill #0
